# Patient Record
Sex: Male | Born: 1960 | Race: White | Hispanic: No | Marital: Married | Smoking: Never smoker
Health system: Southern US, Community
[De-identification: ages and names within clinical notes are randomized; demographics above are authoritative.]

## PROBLEM LIST (undated history)

## (undated) DIAGNOSIS — R7309 Other abnormal glucose: Secondary | ICD-10-CM

## (undated) DIAGNOSIS — K760 Fatty (change of) liver, not elsewhere classified: Secondary | ICD-10-CM

## (undated) DIAGNOSIS — E119 Type 2 diabetes mellitus without complications: Secondary | ICD-10-CM

## (undated) DIAGNOSIS — R079 Chest pain, unspecified: Secondary | ICD-10-CM

## (undated) DIAGNOSIS — K219 Gastro-esophageal reflux disease without esophagitis: Secondary | ICD-10-CM

## (undated) DIAGNOSIS — M545 Low back pain, unspecified: Secondary | ICD-10-CM

## (undated) DIAGNOSIS — R9431 Abnormal electrocardiogram [ECG] [EKG]: Secondary | ICD-10-CM

## (undated) DIAGNOSIS — R945 Abnormal results of liver function studies: Secondary | ICD-10-CM

## (undated) DIAGNOSIS — G4730 Sleep apnea, unspecified: Secondary | ICD-10-CM

## (undated) HISTORY — DX: Gastro-esophageal reflux disease without esophagitis: K21.9

## (undated) HISTORY — DX: Low back pain, unspecified: M54.50

## (undated) HISTORY — DX: Other abnormal glucose: R73.09

## (undated) HISTORY — DX: Abnormal results of liver function studies: R94.5

## (undated) HISTORY — DX: Type 2 diabetes mellitus without complications: E11.9

## (undated) HISTORY — DX: Low back pain: M54.5

## (undated) HISTORY — DX: Fatty (change of) liver, not elsewhere classified: K76.0

## (undated) HISTORY — PX: VASECTOMY REVERSAL: SHX243

## (undated) HISTORY — DX: Abnormal electrocardiogram (ECG) (EKG): R94.31

## (undated) HISTORY — DX: Chest pain, unspecified: R07.9

---

## 1984-09-14 HISTORY — PX: VASECTOMY: SHX75

## 2000-04-26 ENCOUNTER — Ambulatory Visit (HOSPITAL_COMMUNITY): Admission: RE | Admit: 2000-04-26 | Discharge: 2000-04-26 | Payer: Self-pay | Admitting: Internal Medicine

## 2000-04-26 ENCOUNTER — Encounter: Payer: Self-pay | Admitting: Internal Medicine

## 2000-06-04 ENCOUNTER — Ambulatory Visit (HOSPITAL_BASED_OUTPATIENT_CLINIC_OR_DEPARTMENT_OTHER): Admission: RE | Admit: 2000-06-04 | Discharge: 2000-06-04 | Payer: Self-pay | Admitting: Critical Care Medicine

## 2000-10-15 ENCOUNTER — Encounter (INDEPENDENT_AMBULATORY_CARE_PROVIDER_SITE_OTHER): Payer: Self-pay

## 2000-10-15 ENCOUNTER — Ambulatory Visit (HOSPITAL_COMMUNITY): Admission: RE | Admit: 2000-10-15 | Discharge: 2000-10-16 | Payer: Self-pay | Admitting: Otolaryngology

## 2001-10-21 ENCOUNTER — Encounter: Payer: Self-pay | Admitting: Occupational Medicine

## 2001-10-21 ENCOUNTER — Encounter: Admission: RE | Admit: 2001-10-21 | Discharge: 2001-10-21 | Payer: Self-pay | Admitting: Occupational Medicine

## 2004-10-01 ENCOUNTER — Ambulatory Visit: Payer: Self-pay | Admitting: Internal Medicine

## 2005-11-12 ENCOUNTER — Ambulatory Visit: Payer: Self-pay | Admitting: Internal Medicine

## 2005-11-13 ENCOUNTER — Ambulatory Visit: Payer: Self-pay | Admitting: Internal Medicine

## 2006-05-10 ENCOUNTER — Ambulatory Visit: Payer: Self-pay | Admitting: Internal Medicine

## 2006-05-19 ENCOUNTER — Ambulatory Visit: Payer: Self-pay | Admitting: Internal Medicine

## 2006-07-26 ENCOUNTER — Ambulatory Visit: Payer: Self-pay | Admitting: Internal Medicine

## 2008-02-16 ENCOUNTER — Encounter: Payer: Self-pay | Admitting: Internal Medicine

## 2008-02-16 DIAGNOSIS — R945 Abnormal results of liver function studies: Secondary | ICD-10-CM | POA: Insufficient documentation

## 2008-02-16 DIAGNOSIS — M48061 Spinal stenosis, lumbar region without neurogenic claudication: Secondary | ICD-10-CM | POA: Insufficient documentation

## 2009-10-09 ENCOUNTER — Ambulatory Visit: Payer: Self-pay | Admitting: Internal Medicine

## 2009-10-09 DIAGNOSIS — R079 Chest pain, unspecified: Secondary | ICD-10-CM | POA: Insufficient documentation

## 2009-10-09 DIAGNOSIS — R9431 Abnormal electrocardiogram [ECG] [EKG]: Secondary | ICD-10-CM | POA: Insufficient documentation

## 2009-10-10 ENCOUNTER — Encounter (INDEPENDENT_AMBULATORY_CARE_PROVIDER_SITE_OTHER): Payer: Self-pay | Admitting: *Deleted

## 2009-10-14 ENCOUNTER — Telehealth (INDEPENDENT_AMBULATORY_CARE_PROVIDER_SITE_OTHER): Payer: Self-pay | Admitting: *Deleted

## 2009-10-15 ENCOUNTER — Encounter (HOSPITAL_COMMUNITY): Admission: RE | Admit: 2009-10-15 | Discharge: 2009-12-19 | Payer: Self-pay | Admitting: Internal Medicine

## 2009-10-15 ENCOUNTER — Ambulatory Visit: Payer: Self-pay

## 2009-10-15 ENCOUNTER — Ambulatory Visit: Payer: Self-pay | Admitting: Internal Medicine

## 2009-10-16 ENCOUNTER — Encounter: Payer: Self-pay | Admitting: Internal Medicine

## 2009-10-21 ENCOUNTER — Telehealth: Payer: Self-pay | Admitting: Internal Medicine

## 2009-10-25 ENCOUNTER — Ambulatory Visit: Payer: Self-pay | Admitting: Internal Medicine

## 2009-10-25 DIAGNOSIS — K219 Gastro-esophageal reflux disease without esophagitis: Secondary | ICD-10-CM | POA: Insufficient documentation

## 2009-10-25 DIAGNOSIS — R7309 Other abnormal glucose: Secondary | ICD-10-CM | POA: Insufficient documentation

## 2009-10-25 LAB — CONVERTED CEMR LAB
ALT: 32 units/L (ref 0–53)
AST: 23 units/L (ref 0–37)
Albumin: 4.1 g/dL (ref 3.5–5.2)
Alkaline Phosphatase: 62 units/L (ref 39–117)
BUN: 10 mg/dL (ref 6–23)
Basophils Absolute: 0 10*3/uL (ref 0.0–0.1)
Basophils Relative: 0.3 % (ref 0.0–3.0)
Bilirubin Urine: NEGATIVE
Bilirubin, Direct: 0.2 mg/dL (ref 0.0–0.3)
CO2: 28 meq/L (ref 19–32)
Calcium: 9.2 mg/dL (ref 8.4–10.5)
Chloride: 108 meq/L (ref 96–112)
Cholesterol: 153 mg/dL (ref 0–200)
Creatinine, Ser: 0.9 mg/dL (ref 0.4–1.5)
Eosinophils Absolute: 0.1 10*3/uL (ref 0.0–0.7)
Eosinophils Relative: 1.5 % (ref 0.0–5.0)
GFR calc non Af Amer: 96.06 mL/min (ref >60)
Glucose, Bld: 91 mg/dL (ref 70–99)
HCT: 41.6 % (ref 39.0–52.0)
HDL: 37.8 mg/dL — ABNORMAL LOW (ref >39.00)
Hemoglobin, Urine: NEGATIVE
Hemoglobin: 13.9 g/dL (ref 13.0–17.0)
Hgb A1c MFr Bld: 5.5 % (ref 4.6–6.5)
Ketones, ur: NEGATIVE mg/dL
LDL Cholesterol: 99 mg/dL (ref 0–99)
Leukocytes, UA: NEGATIVE
Lymphocytes Relative: 31.4 % (ref 12.0–46.0)
Lymphs Abs: 1.3 10*3/uL (ref 0.7–4.0)
MCHC: 33.5 g/dL (ref 30.0–36.0)
MCV: 92 fL (ref 78.0–100.0)
Monocytes Absolute: 0.3 10*3/uL (ref 0.1–1.0)
Monocytes Relative: 6.9 % (ref 3.0–12.0)
Neutro Abs: 2.3 10*3/uL (ref 1.4–7.7)
Neutrophils Relative %: 59.9 % (ref 43.0–77.0)
Nitrite: NEGATIVE
Platelets: 163 10*3/uL (ref 150.0–400.0)
Potassium: 4.3 meq/L (ref 3.5–5.1)
RBC: 4.52 M/uL (ref 4.22–5.81)
RDW: 12.8 % (ref 11.5–14.6)
Sodium: 140 meq/L (ref 135–145)
Specific Gravity, Urine: >=1.03 (ref 1.000–1.030)
TSH: 2.15 microintl units/mL (ref 0.35–5.50)
Total Bilirubin: 0.4 mg/dL (ref 0.3–1.2)
Total CHOL/HDL Ratio: 4
Total Protein, Urine: NEGATIVE mg/dL
Total Protein: 7.3 g/dL (ref 6.0–8.3)
Triglycerides: 81 mg/dL (ref 0.0–149.0)
Urine Glucose: NEGATIVE mg/dL
Urobilinogen, UA: 0.2 (ref 0.0–1.0)
VLDL: 16.2 mg/dL (ref 0.0–40.0)
WBC: 4 10*3/uL — ABNORMAL LOW (ref 4.5–10.5)
pH: 5 (ref 5.0–8.0)

## 2010-10-16 NOTE — Assessment & Plan Note (Signed)
Summary: Cardiology Nuclear Study  Nuclear Med Background Indications for Stress Test: Evaluation for Ischemia, Abnormal EKG    History Comments: NO DOCUMENTED CAD  Symptoms: Chest Pain  Symptoms Comments: CP with stooping and coughing. Last episode of CP:7:00 am today, 5-6/10.   Nuclear Pre-Procedure Cardiac Risk Factors: Obesity Caffeine/Decaff Intake: none NPO After: 12:00 AM Lungs: Clear.  O2 Sat 96% on RA. IV 0.9% NS with Angio Cath: 20g     IV Site: (R) Wrist IV Started by: Stanton Kidney EMT-P Chest Size (in) 46     Height (in): 71 Weight (lb): 234 BMI: 32.75  Nuclear Med Study 1 or 2 day study:  1 day     Stress Test Type:  Eugenie Birks Reading MD:  Arvilla Meres, MD     Referring MD:  Sanda Linger, MD Resting Radionuclide:  Technetium 35m Tetrofosmin     Resting Radionuclide Dose:  11.0 mCi  Stress Radionuclide:  Technetium 63m Tetrofosmin     Stress Radionuclide Dose:  33.0 mCi   Stress Protocol   Lexiscan: 0.4 mg   Stress Test Technologist:  Rea College CMA-N     Nuclear Technologist:  Harlow Asa CNMT  Rest Procedure  Myocardial perfusion imaging was performed at rest 45 minutes following the intravenous administration of Myoview Technetium 28m Tetrofosmin.  Stress Procedure  The patient was initially scheduled to walk the treadmill utilizing the Bruce protocol, but due to baseline EKG changes and recent chest pain this a.m., Dr. Graciela Husbands recommended doing lexiscan instead.  The patient received IV Lexiscan 0.4 mg over 15-seconds.  Myoview injected at 30-seconds.  There were no significant changes with infusion.  Quantitative spect images were obtained after a 45 minute delay.  QPS Raw Data Images:  Normal; no motion artifact; normal heart/lung ratio. Stress Images:  There is normal uptake in all areas. Rest Images:  Normal homogeneous uptake in all areas of the myocardium. Subtraction (SDS):  Normal Transient Ischemic Dilatation:  .99  (Normal <1.22)  Lung/Heart Ratio:  .29  (Normal <0.45)  Quantitative Gated Spect Images QGS EDV:  117 ml QGS ESV:  49 ml QGS EF:  58 % QGS cine images:  Normal.   Findings Normal nuclear study      Overall Impression  Exercise Capacity: Lexiscan study with no exercise. ECG Impression: Baseline: NSR; No significant ST segment change with Lexiscan. Overall Impression: Normal stress nuclear study.

## 2010-10-16 NOTE — Progress Notes (Signed)
Summary: CALL  Phone Note Call from Patient Call back at Home Phone [phone]   Summary of Call: Pt's wife called, she would like a call regarding stress test results and cxr.  Initial call taken by: Lamar Sprinkles, CMA,  October 21, 2009 4:43 PM  Follow-up for Phone Call        Pt's wife informed that pt is due for f/u office visit and letter was mailed Follow-up by: Lamar Sprinkles, CMA,  October 21, 2009 6:22 PM

## 2010-10-16 NOTE — Letter (Signed)
Summary: Lipid Letter  [facility]  [address] [place], [place] [address]   Phone: [phone]  Fax: [phone]    10/25/2009  Henry Lang [address] [place], [place]  [address]  Dear Henry Lang:  We have carefully reviewed your last lipid profile from 10/25/2009 and the results are noted below with a summary of recommendations for lipid management.    Cholesterol:       153     Goal: <200   HDL "good" Cholesterol:   57.84     Goal: >40   LDL "bad" Cholesterol:   99     Goal: <130   Triglycerides:       81.0     Goal: <150    pretty good results    TLC Diet (Therapeutic Lifestyle Change): Saturated Fats & Transfatty acids should be kept < 7% of total calories ***Reduce Saturated Fats Polyunstaurated Fat can be up to 10% of total calories Monounsaturated Fat Fat can be up to 20% of total calories Total Fat should be no greater than 25-35% of total calories Carbohydrates should be 50-60% of total calories Protein should be approximately 15% of total calories Fiber should be at least 20-30 grams a day ***Increased fiber may help lower LDL Total Cholesterol should be < 200mg /day Consider adding plant stanol/sterols to diet (example: Benacol spread) ***A higher intake of unsaturated fat may reduce Triglycerides and Increase HDL    Adjunctive Measures (may lower LIPIDS and reduce risk of Heart Attack) include: Aerobic Exercise (20-30 minutes 3-4 times a week) Limit Alcohol Consumption Weight Reduction Aspirin 75-81 mg a day by mouth (if not allergic or contraindicated) Dietary Fiber 20-30 grams a day by mouth     Current Medications: 1)    Aciphex 20 Mg Tbec (Rabeprazole sodium) .... Once daily for indigestion  If you have any questions, please call. We appreciate being able to work with you.   Sincerely,    [facility] Etta Grandchild MD

## 2010-10-16 NOTE — Assessment & Plan Note (Signed)
Summary: FU--STC   Vital Signs:  Patient profile:   50 year old male Height:      71 inches Weight:      228 pounds BMI:     31.91 O2 Sat:      96 % on Room air Temp:     97.7 degrees F oral Pulse rate:   58 / minute Pulse rhythm:   regular Resp:     16 per minute BP sitting:   104 / 70  (left arm) Cuff size:   large  Vitals Entered By: Rock Nephew CMA (October 25, 2009 8:23 AM)  Nutrition Counseling: Patient's BMI is greater than 25 and therefore counseled on weight management options.  O2 Flow:  Room air CC: follow-up visit, Abdominal Pain Is Patient Diabetic? No Pain Assessment Patient in pain? no        Primary Care Provider:  Etta Grandchild MD  CC:  follow-up visit and Abdominal Pain.  History of Present Illness: He returns for f/up and says the pain around his epigastric region is better and less frequent. His cardiac test was normal.  Dyspepsia History:      He has no alarm features of dyspepsia including no history of melena, hematochezia, dysphagia, persistent vomiting, or involuntary weight loss > 5%.  There is a prior history of GERD.  The patient does not have a prior history of documented ulcer disease.  The dominant symptom is heartburn or acid reflux.  An H-2 blocker medication is not currently being taken.     Current Medications (verified): 1)  None  Allergies (verified): No Known Drug Allergies  Past History:  Past Medical History: Reviewed history from 02/16/2008 and no changes required. Low back pain  Past Surgical History: Reviewed history from 02/16/2008 and no changes required. Vasectomy ( 1986) Vasectomy reversal (1992)  Family History: Reviewed history from 10/09/2009 and no changes required. Family History Breast cancer 1st degree relative <50 Family History Diabetes 1st degree relative Family History Hypertension  Social History: Reviewed history from 10/09/2009 and no changes required. Occupation: Psychologist, prison and probation services Married Never Smoked Alcohol use-no Drug use-no Regular exercise-yes  Review of Systems       The patient complains of weight gain and abdominal pain.  The patient denies anorexia, fever, chest pain, syncope, dyspnea on exertion, peripheral edema, prolonged cough, headaches, hemoptysis, melena, hematochezia, severe indigestion/heartburn, hematuria, genital sores, suspicious skin lesions, depression, enlarged lymph nodes, angioedema, and testicular masses.    Physical Exam  General:  alert, well-developed, well-nourished, well-hydrated, appropriate dress, normal appearance, healthy-appearing, cooperative to examination, and overweight-appearing.   Head:  normocephalic, atraumatic, no abnormalities observed, and no abnormalities palpated.   Mouth:  Oral mucosa and oropharynx without lesions or exudates.  Teeth in good repair. Neck:  supple, full ROM, no masses, no thyromegaly, no JVD, normal carotid upstroke, no carotid bruits, and no cervical lymphadenopathy.   Chest Wall:  No deformities, masses, tenderness or gynecomastia noted. Breasts:  No masses or gynecomastia noted Lungs:  Normal respiratory effort, chest expands symmetrically. Lungs are clear to auscultation, no crackles or wheezes. Heart:  Normal rate and regular rhythm. S1 and S2 normal without gallop, murmur, click, rub or other extra sounds. Abdomen:  soft, non-tender, normal bowel sounds, no distention, no masses, no guarding, no rigidity, no rebound tenderness, no hepatomegaly, and no splenomegaly.   Rectal:  No external abnormalities noted. Normal sphincter tone. No rectal masses or tenderness. heme negatvie stool. Genitalia:  circumcised, no hydrocele, no varicocele,  no scrotal masses, no testicular masses or atrophy, no cutaneous lesions, and no urethral discharge.   Prostate:  Prostate gland firm and smooth, no enlargement, nodularity, tenderness, mass, asymmetry or induration. Msk:  No deformity or scoliosis noted  of thoracic or lumbar spine.   Pulses:  R and L carotid,radial,femoral,dorsalis pedis and posterior tibial pulses are full and equal bilaterally Extremities:  No clubbing, cyanosis, edema, or deformity noted with normal full range of motion of all joints.   Neurologic:  No cranial nerve deficits noted. Station and gait are normal. Plantar reflexes are down-going bilaterally. DTRs are symmetrical throughout. Sensory, motor and coordinative functions appear intact. Skin:  Intact without suspicious lesions or rashes Cervical Nodes:  no anterior cervical adenopathy and no posterior cervical adenopathy.   Axillary Nodes:  no R axillary adenopathy and no L axillary adenopathy.   Inguinal Nodes:  no R inguinal adenopathy and no L inguinal adenopathy.   Psych:  Cognition and judgment appear intact. Alert and cooperative with normal attention span and concentration. No apparent delusions, illusions, hallucinations   Impression & Recommendations:  Problem # 1:  GERD (ICD-530.81)  His updated medication list for this problem includes:    Aciphex 20 Mg Tbec (Rabeprazole sodium) ..... Once daily for indigestion  Problem # 2:  HYPERGLYCEMIA, BORDERLINE (ICD-790.29) Assessment: New  Orders: Venipuncture ([address]) TLB-Lipid Panel (80061-LIPID) TLB-A1C / Hgb A1C (Glycohemoglobin) (83036-A1C) TLB-Udip w/ Micro (81001-URINE) TLB-BMP (Basic Metabolic Panel-BMET) (80048-METABOL) TLB-CBC Platelet - w/Differential (85025-CBCD) TLB-Hepatic/Liver Function Pnl (80076-HEPATIC) TLB-TSH (Thyroid Stimulating Hormone) (84443-TSH)  Problem # 3:  ROUTINE GENERAL MEDICAL EXAM@HEALTH  CARE FACL (ICD-V70.0) Assessment: New  Orders: Venipuncture ([address]) TLB-Lipid Panel (80061-LIPID) TLB-A1C / Hgb A1C (Glycohemoglobin) (83036-A1C) TLB-Udip w/ Micro (81001-URINE) TLB-BMP (Basic Metabolic Panel-BMET) (80048-METABOL) TLB-CBC Platelet - w/Differential (85025-CBCD) TLB-Hepatic/Liver Function Pnl (80076-HEPATIC) TLB-TSH  (Thyroid Stimulating Hormone) (84443-TSH)  Discussed using sunscreen, use of alcohol, drug use, self testicular exam, routine dental care, routine eye care, routine physical exam, seat belts, multiple vitamins,  and recommendations for immunizations.  Discussed exercise and checking cholesterol.  Discussed gun safety and recommend checking PSA.  Complete Medication List: 1)  Aciphex 20 Mg Tbec (Rabeprazole sodium) .... Once daily for indigestion  Patient Instructions: 1)  Please schedule a follow-up appointment in 2 months. 2)  Avoid foods high in acid (tomatoes, citrus juices, spicy foods). Avoid eating within two hours of lying down or before exercising. Do not over eat; try smaller more frequent meals. Elevate head of bed twelve inches when sleeping. 3)  It is important that you exercise regularly at least 20 minutes 5 times a week. If you develop chest pain, have severe difficulty breathing, or feel very tired , stop exercising immediately and seek medical attention. 4)  You need to lose weight. Consider a lower calorie diet and regular exercise.  Prescriptions: ACIPHEX 20 MG TBEC (RABEPRAZOLE SODIUM) once daily for indigestion  #24 x 0   Entered and Authorized by:   Etta Grandchild MD   Signed by:   Etta Grandchild MD on 10/25/2009   Method used:   Samples Given   RxID:   [id number]

## 2010-10-16 NOTE — Letter (Signed)
Summary: [facility] Consult Scheduled Letter  [facility]  [address] [place], [place] [address]   Phone: [phone]  Fax: [phone]      10/10/2009 MRN: [id number]  [facility] [address], [place]  [address]    Dear Mr. Borawski,      We have scheduled an appointment for you.  At the recommendation of Dr.Jones, we have scheduled you a consult with [facility]) on Febuary 1,2011 at 9:00.Their phone number is [phone].If this appointment day and time is not convenient for you, please feel free to call the office of the doctor you are being referred to at the number listed above and reschedule the appointment.  [facility] [address]  [place] [address]   Thank you,  Patient Care Coordinator [facility]

## 2010-10-16 NOTE — Progress Notes (Signed)
Summary: Nuclear Pre-Procedure  Phone Note Outgoing Call   Call placed by: Milana Na, EMT-P,  October 14, 2009 2:08 PM Summary of Call: Left message with information on Myoview Information Sheet (see scanned document for details).      Nuclear Med Background Indications for Stress Test: Evaluation for Ischemia, Abnormal EKG     Symptoms: Chest Pain    Nuclear Pre-Procedure Height (in): 72  Nuclear Med Study Referring MD:  Sanda Linger

## 2010-10-16 NOTE — Letter (Signed)
Summary: Results Follow-up Letter  [facility]  [address] [place], [place] [address]   Phone: [phone]  Fax: [phone]    10/16/2009  [address] [place], [place]  [address]  Dear Mr. Mcwilliams,   The following are the results of your recent test(s):  Test     Result     Cradiac test   normal  _________________________________________________________  Please call for an appointment as directed _________________________________________________________ _________________________________________________________ _________________________________________________________  Sincerely,  Sanda Linger MD [facility]

## 2010-10-16 NOTE — Assessment & Plan Note (Signed)
Summary: NEW/ [facility]/ FORMER PT OF AVP/ LOV 2007 / PAIN IN STOMACH/SE...   Vital Signs:  Patient profile:   50 year old Lang Height:      72 inches Weight:      227 pounds BMI:     30.90 O2 Sat:      95 % on Room air Temp:     98.0 degrees F oral Pulse rate:   65 / minute Pulse rhythm:   regular Resp:     16 per minute BP sitting:   98 / 68  (left arm) Cuff size:   large  Vitals Entered By: Rock Nephew CMA (October 09, 2009 9:07 AM)  Nutrition Counseling: Patient's BMI is greater than 25 and therefore counseled on weight management options.  O2 Flow:  Room air CC: new to establish, Cough   Primary Care Provider:  Etta Grandchild MD  CC:  new to establish and Cough.  History of Present Illness:       This is a 50 year old Lang who presents with Chest pain.  The symptoms began 2 weeks ago.  On a scale of 1 to 10, the intensity is described as a 4.  The patient denies exertional chest pain, nausea, vomiting, diaphoresis, shortness of breath, palpitations, dizziness, light headedness, syncope, and indigestion.  The pain is described as intermittent and sharp.  The pain is located in the substernal area and the pain does not radiate.  Episodes of chest pain last < 1 minute.  The pain is brought on or made worse by any activity and coughing.    Cough      The patient also presents with Cough.  The symptoms began 2 weeks ago.  The intensity is described as mild.  The patient reports non-productive cough, but denies productive cough, pleuritic chest pain, shortness of breath, wheezing, exertional dyspnea, fever, hemoptysis, and malaise.  The patient denies the following symptoms: cold/URI symptoms, sore throat, nasal congestion, chronic rhinitis, weight loss, acid reflux symptoms, and peripheral edema.  The cough is worse with cold exposure.    Preventive Screening-Counseling & Management  Alcohol-Tobacco     Alcohol drinks/day: <1     Alcohol type: beer     >5/day in last 3 mos:  no     Alcohol Counseling: not indicated; use of alcohol is not excessive or problematic     Feels need to cut down: no     Feels annoyed by complaints: no     Feels guilty re: drinking: no     Needs 'eye opener' in am: no     Smoking Status: never  Caffeine-Diet-Exercise     Does Patient Exercise: yes  Hep-HIV-STD-Contraception     Hepatitis Risk: no risk noted     HIV Risk: no risk noted     STD Risk: no risk noted      Sexual History:  currently monogamous.        Drug Use:  never and no.        Blood Transfusions:  no.    Current Medications (verified): 1)  None  Allergies (verified): No Known Drug Allergies  Past History:  Past Medical History: Reviewed history from 02/16/2008 and no changes required. Low back pain  Past Surgical History: Reviewed history from 02/16/2008 and no changes required. Vasectomy ( 1986) Vasectomy reversal (1992)  Family History: Reviewed history and no changes required. Family History Breast cancer 1st degree relative <50 Family History Diabetes 1st degree relative Family  History Hypertension  Social History: Reviewed history and no changes required. Occupation: Journalist, newspaper Married Never Smoked Alcohol use-no Drug use-no Regular exercise-yes Drug Use:  never, no Does Patient Exercise:  yes Hepatitis Risk:  no risk noted HIV Risk:  no risk noted STD Risk:  no risk noted Sexual History:  currently monogamous Blood Transfusions:  no  Review of Systems       The patient complains of weight gain, chest pain, and prolonged cough.  The patient denies anorexia, fever, weight loss, syncope, dyspnea on exertion, peripheral edema, headaches, hemoptysis, abdominal pain, melena, hematochezia, severe indigestion/heartburn, hematuria, suspicious skin lesions, difficulty walking, depression, enlarged lymph nodes, angioedema, and testicular masses.    Physical Exam  General:  alert, well-developed, well-nourished, well-hydrated,  appropriate dress, normal appearance, healthy-appearing, cooperative to examination, and overweight-appearing.   Head:  normocephalic, atraumatic, no abnormalities observed, and no abnormalities palpated.   Eyes:  No corneal or conjunctival inflammation noted. EOMI. Perrla. Funduscopic exam benign, without hemorrhages, exudates or papilledema. Vision grossly normal. Mouth:  Oral mucosa and oropharynx without lesions or exudates.  Teeth in good repair. Neck:  supple, full ROM, no masses, no thyromegaly, no JVD, normal carotid upstroke, no carotid bruits, and no cervical lymphadenopathy.   Chest Wall:  No deformities, masses, tenderness or gynecomastia noted. Breasts:  No masses or gynecomastia noted Lungs:  Normal respiratory effort, chest expands symmetrically. Lungs are clear to auscultation, no crackles or wheezes. Heart:  Normal rate and regular rhythm. S1 and S2 normal without gallop, murmur, click, rub or other extra sounds. Abdomen:  soft, non-tender, normal bowel sounds, no distention, no masses, no guarding, no rigidity, no hepatomegaly, and no splenomegaly.   Msk:  No deformity or scoliosis noted of thoracic or lumbar spine.   Pulses:  R and L carotid,radial,femoral,dorsalis pedis and posterior tibial pulses are full and equal bilaterally Extremities:  No clubbing, cyanosis, edema, or deformity noted with normal full range of motion of all joints.   Neurologic:  No cranial nerve deficits noted. Station and gait are normal. Plantar reflexes are down-going bilaterally. DTRs are symmetrical throughout. Sensory, motor and coordinative functions appear intact. Skin:  Intact without suspicious lesions or rashes Cervical Nodes:  no anterior cervical adenopathy and no posterior cervical adenopathy.   Axillary Nodes:  no R axillary adenopathy and no L axillary adenopathy.   Psych:  Cognition and judgment appear intact. Alert and cooperative with normal attention span and concentration. No apparent  delusions, illusions, hallucinations Additional Exam:  EKG shows NSR with poor RWP in V1 and V2 but no acute ST/T waves or Q waves. There is minimal evidence of LVH.   Impression & Recommendations:  Problem # 1:  ABNORMAL ELECTROCARDIOGRAM (ICD-794.31) Assessment New  Orders: [facility] (Card[facility]) EKG w/ Interpretation (93[address])  Problem # 2:  CHEST PAIN (ICD-786.50) Assessment: New this sounds musculoskeletal and/or part of a viral URI with cough. Since his EKG is abnormal will pursue further with Xray and [facility] Orders: T-2 View CXR (71020TC) [facility] ([facility]) EKG w/ Interpretation ([address])  Patient Instructions: 1)  Please schedule a follow-up appointment in 2 weeks. 2)  Get plenty of rest, drink lots of clear liquids, and use Tylenol or Ibuprofen for fever and comfort. Return in 7-10 days if you're not better:sooner if you're feeling worse.

## 2011-01-30 NOTE — Op Note (Signed)
[facility]  Patient:    Henry Lang, Henry Lang                          MRN: [id number] Proc. Date: 10/15/00 Attending:  Margit Banda. Jearld Fenton, M.D. CC:         Sonda Primes, M.D. [facility]  Luisa Hart E. Delford Field, M.D. [facility]   Operative Report  PREOPERATIVE DIAGNOSIS:  Obstructive sleep apnea.  POSTOPERATIVE DIAGNOSIS:  Obstructive sleep apnea.  PROCEDURES:  Septoplasty, submucous resection of inferior turbinates, uvulopharyngopalatoplasty, and tonsillectomy.  ANESTHESIA:  General endotracheal tube.  ESTIMATED BLOOD LOSS:  Approximately 25 cc.  INDICATIONS:  This is a 50 year old who has had obstructive sleep apnea diagnosed with an RDI of approximately 37 and slight oxygen desaturation.  He was tried on CPAP and did not do well on this and was very unhappy and not getting good treatment.  He then was offered an option of surgery, which he elected to proceed with.  He understands the risks to be bleeding, infection, perforation, change in the external appearance of his nose, chronic crusting and drying, velopharyngeal insufficiency, change in his voice, and risk of the anesthetic.  All questions were answered, and consent was obtained.  DESCRIPTION OF PROCEDURE:  Patient taken to the operating room and placed in the supine position after adequate general endotracheal tube anesthesia, was placed in the supine position, prepped and draped in the usual sterile manner. Oxymetazoline pledgets were placed into the nose bilaterally, and the septum and inferior turbinates were injected with 1% lidocaine with 1:100,000 epinephrine.  The right hemitransfixion incision was performed, raising a mucoperichondrial and osteal flap.  The cartilage was divided 2 cm posterior to the caudal strut.  He had a significant deviation of both the posterior aspect of the cartilaginous septum as well as the bony septum.  The cartilage was removed with a Therapist, nutritional.  The Jansen-Middleton  forceps, open, were used to remove the bony portion of the septum.  The spur, which was inferior, was removed with the [facility] forceps.  This corrected the septal deflection. The turbinates were then infractured and a midline incision made with a 15 blade, the mucosal flap elevated superiorly, and the inferior mucosa and bone were removed with the turbinate scissors.  The edge was cauterized with suction cautery.  The turbinates were outfractured with a Therapist, nutritional.  The hemitransfixion incision was closed with interrupted 4-0 chromic and a quilting 4-0 plain gut placed at the septum.  Telfa rolls soaked in bacitracin were placed into the nose bilaterally and secured with 3-0 nylon.  The patient was then placed in the [facility] position.  The Crowe-Davis mouth gag was inserted, retracted, and suspended from the [facility] stand.  The palate had been marked with an [place] ink, and the dissection was begun with electrocautery just above the level of the uvula, approximately 0.5 cm.  This was dissected down into the anterior fossa on the left side, and the tonsil was identified and the capsule of it was identified.  This was removed with electrocautery dissection.  The dissection was carried across the palate into the right anterior tonsillar pillar, and again the tonsillar fossa was identified and removed with electrocautery dissection.  Hemostasis was achieved with suction cautery.  The tonsils and palate were all removed in one en bloc specimen.  The palate was then closed with interrupted 3-0 Vicryl.  There was very nice approximation of his palate and a much  better open airway.  The Crowe-Davis was released and resuspended, and there was hemostasis present in all locations.  The area was irrigated.  The hypopharynx, esophagus, and stomach were suctioned with the NG tube.  The patient was then awakened, brought to recovery in stable condition, counts correct. DD:  10/15/00 TD:  10/16/00 Job:  [id number]

## 2011-09-15 HISTORY — PX: PALATE / UVULA BIOPSY / EXCISION: SUR128

## 2012-10-21 ENCOUNTER — Ambulatory Visit (INDEPENDENT_AMBULATORY_CARE_PROVIDER_SITE_OTHER)
Admission: RE | Admit: 2012-10-21 | Discharge: 2012-10-21 | Disposition: A | Discharge status: Home / Self Care | Payer: PRIVATE HEALTH INSURANCE | Source: Ambulatory Visit | Attending: Internal Medicine | Admitting: Internal Medicine

## 2012-10-21 ENCOUNTER — Other Ambulatory Visit (INDEPENDENT_AMBULATORY_CARE_PROVIDER_SITE_OTHER): Payer: PRIVATE HEALTH INSURANCE

## 2012-10-21 ENCOUNTER — Ambulatory Visit (INDEPENDENT_AMBULATORY_CARE_PROVIDER_SITE_OTHER): Payer: PRIVATE HEALTH INSURANCE | Admitting: Internal Medicine

## 2012-10-21 ENCOUNTER — Encounter: Payer: Self-pay | Admitting: Internal Medicine

## 2012-10-21 VITALS — BP 118/70 | HR 80 | Temp 97.8°F | Resp 16 | Ht 71.0 in | Wt 260.0 lb

## 2012-10-21 DIAGNOSIS — R10814 Left lower quadrant abdominal tenderness: Secondary | ICD-10-CM

## 2012-10-21 DIAGNOSIS — Z0000 Encounter for general adult medical examination without abnormal findings: Secondary | ICD-10-CM

## 2012-10-21 LAB — LIPASE: Lipase: 24 U/L (ref 11.0–59.0)

## 2012-10-21 LAB — URINALYSIS, ROUTINE W REFLEX MICROSCOPIC
Ketones, ur: NEGATIVE
Specific Gravity, Urine: <=1.005 (ref 1.000–1.030)
Urine Glucose: NEGATIVE
Urobilinogen, UA: 0.2 (ref 0.0–1.0)

## 2012-10-21 LAB — COMPREHENSIVE METABOLIC PANEL
Alkaline Phosphatase: 62 U/L (ref 39–117)
BUN: 14 mg/dL (ref 6–23)
Creatinine, Ser: 0.9 mg/dL (ref 0.4–1.5)
Glucose, Bld: 102 mg/dL — ABNORMAL HIGH (ref 70–99)
Sodium: 138 mEq/L (ref 135–145)
Total Bilirubin: 0.6 mg/dL (ref 0.3–1.2)

## 2012-10-21 LAB — CBC WITH DIFFERENTIAL/PLATELET
Basophils Relative: 0.4 % (ref 0.0–3.0)
Eosinophils Relative: 1.1 % (ref 0.0–5.0)
HCT: 40.3 % (ref 39.0–52.0)
Lymphs Abs: 1.7 10*3/uL (ref 0.7–4.0)
MCV: 88.4 fl (ref 78.0–100.0)
Monocytes Absolute: 0.5 10*3/uL (ref 0.1–1.0)
Neutro Abs: 4.3 10*3/uL (ref 1.4–7.7)
Platelets: 198 10*3/uL (ref 150.0–400.0)
WBC: 6.6 10*3/uL (ref 4.5–10.5)

## 2012-10-21 LAB — LIPID PANEL
HDL: 31.4 mg/dL — ABNORMAL LOW (ref >39.00)
VLDL: 21 mg/dL (ref 0.0–40.0)

## 2012-10-21 LAB — TSH: TSH: 2.85 u[IU]/mL (ref 0.35–5.50)

## 2012-10-21 NOTE — Patient Instructions (Signed)
Health Maintenance, Males A healthy lifestyle and preventative care can promote health and wellness.  Maintain regular health, dental, and eye exams.  Eat a healthy diet. Foods like vegetables, fruits, whole grains, low-fat dairy products, and lean protein foods contain the nutrients you need without too many calories. Decrease your intake of foods high in solid fats, added sugars, and salt. Get information about a proper diet from your caregiver, if necessary.  Regular physical exercise is one of the most important things you can do for your health. Most adults should get at least 150 minutes of moderate-intensity exercise (any activity that increases your heart rate and causes you to sweat) each week. In addition, most adults need muscle-strengthening exercises on 2 or more days a week.   Maintain a healthy weight. The body mass index (BMI) is a screening tool to identify possible weight problems. It provides an estimate of body fat based on height and weight. Your caregiver can help determine your BMI, and can help you achieve or maintain a healthy weight. For adults 20 years and older:  A BMI below 18.5 is considered underweight.  A BMI of 18.5 to 24.9 is normal.  A BMI of 25 to 29.9 is considered overweight.  A BMI of 30 and above is considered obese.  Maintain normal blood lipids and cholesterol by exercising and minimizing your intake of saturated fat. Eat a balanced diet with plenty of fruits and vegetables. Blood tests for lipids and cholesterol should begin at age 20 and be repeated every 5 years. If your lipid or cholesterol levels are high, you are over 50, or you are a high risk for heart disease, you may need your cholesterol levels checked more frequently.Ongoing high lipid and cholesterol levels should be treated with medicines, if diet and exercise are not effective.  If you smoke, find out from your caregiver how to quit. If you do not use tobacco, do not start.  If you  choose to drink alcohol, do not exceed 2 drinks per day. One drink is considered to be 12 ounces (355 mL) of beer, 5 ounces (148 mL) of wine, or 1.5 ounces (44 mL) of liquor.  Avoid use of street drugs. Do not share needles with anyone. Ask for help if you need support or instructions about stopping the use of drugs.  High blood pressure causes heart disease and increases the risk of stroke. Blood pressure should be checked at least every 1 to 2 years. Ongoing high blood pressure should be treated with medicines if weight loss and exercise are not effective.  If you are 45 to 51 years old, ask your caregiver if you should take aspirin to prevent heart disease.  Diabetes screening involves taking a blood sample to check your fasting blood sugar level. This should be done once every 3 years, after age 45, if you are within normal weight and without risk factors for diabetes. Testing should be considered at a younger age or be carried out more frequently if you are overweight and have at least 1 risk factor for diabetes.  Colorectal cancer can be detected and often prevented. Most routine colorectal cancer screening begins at the age of 50 and continues through age 75. However, your caregiver may recommend screening at an earlier age if you have risk factors for colon cancer. On a yearly basis, your caregiver may provide home test kits to check for hidden blood in the stool. Use of a small camera at the end of a tube,   to directly examine the colon (sigmoidoscopy or colonoscopy), can detect the earliest forms of colorectal cancer. Talk to your caregiver about this at age 73, when routine screening begins. Direct examination of the colon should be repeated every 5 to 10 years through age 23, unless early forms of pre-cancerous polyps or small growths are found.  Hepatitis C blood testing is recommended for all people born from [birth year] through 1964 and any individual with known risks for hepatitis C.  Healthy  men should no longer receive prostate-specific antigen (PSA) blood tests as part of routine cancer screening. Consult with your caregiver about prostate cancer screening.  Testicular cancer screening is not recommended for adolescents or adult males who have no symptoms. Screening includes self-exam, caregiver exam, and other screening tests. Consult with your caregiver about any symptoms you have or any concerns you have about testicular cancer.  Practice safe sex. Use condoms and avoid high-risk sexual practices to reduce the spread of sexually transmitted infections (STIs).  Use sunscreen with a sun protection factor (SPF) of 30 or greater. Apply sunscreen liberally and repeatedly throughout the day. You should seek shade when your shadow is shorter than you. Protect yourself by wearing long sleeves, pants, a wide-brimmed hat, and sunglasses year round, whenever you are outdoors.  Notify your caregiver of new moles or changes in moles, especially if there is a change in shape or color. Also notify your caregiver if a mole is larger than the size of a pencil eraser.  A one-time screening for abdominal aortic aneurysm (AAA) and surgical repair of large AAAs by sound wave imaging (ultrasonography) is recommended for ages 47 to 76 years who are current or former smokers.  Stay current with your immunizations. Document Released: 02/27/2008 Document Revised: 11/23/2011 Document Reviewed: 01/26/2011 [facility] Patient Information 2013 [place], [place]. Abdominal Pain Abdominal pain can be caused by many things. Your caregiver decides the seriousness of your pain by an examination and possibly blood tests and X-rays. Many cases can be observed and treated at home. Most abdominal pain is not caused by a disease and will probably improve without treatment. However, in many cases, more time must pass before a clear cause of the pain can be found. Before that point, it may not be known if you need more testing,  or if hospitalization or surgery is needed. HOME CARE INSTRUCTIONS   Do not take laxatives unless directed by your caregiver.  Take pain medicine only as directed by your caregiver.  Only take over-the-counter or prescription medicines for pain, discomfort, or fever as directed by your caregiver.  Try a clear liquid diet (broth, tea, or water) for as long as directed by your caregiver. Slowly move to a bland diet as tolerated. SEEK IMMEDIATE MEDICAL CARE IF:   The pain does not go away.  You have a fever.  You keep throwing up (vomiting).  The pain is felt only in portions of the abdomen. Pain in the right side could possibly be appendicitis. In an adult, pain in the left lower portion of the abdomen could be colitis or diverticulitis.  You pass bloody or black tarry stools. MAKE SURE YOU:   Understand these instructions.  Will watch your condition.  Will get help right away if you are not doing well or get worse. Document Released: 06/10/2005 Document Revised: 11/23/2011 Document Reviewed: 04/18/2008 [facility] Patient Information 2013 [place], [place].

## 2012-10-21 NOTE — Progress Notes (Signed)
Subjective:    Patient ID: Henry Lang, male    DOB: 03-16-1961, 52 y.o.   MRN: [id number]  Abdominal Pain This is a new problem. The current episode started 1 to 4 weeks ago. The onset quality is gradual. The problem occurs intermittently. The problem has been unchanged. The pain is located in the LLQ. The pain is at a severity of 2/10. The pain is mild. The quality of the pain is sharp. The abdominal pain does not radiate. Associated symptoms include constipation. Pertinent negatives include no anorexia, arthralgias, belching, diarrhea, dysuria, fever, flatus, frequency, headaches, hematochezia, hematuria, melena, myalgias, nausea, vomiting or weight loss. Nothing aggravates the pain. The pain is relieved by nothing. He has tried oral narcotic analgesics (his wife's percocet) for the symptoms. The treatment provided moderate relief.      Review of Systems  Constitutional: Negative for fever, chills, weight loss, diaphoresis, activity change, appetite change, fatigue and unexpected weight change.  HENT: Negative.   Eyes: Negative.   Respiratory: Negative for cough, chest tightness, shortness of breath, wheezing and stridor.   Cardiovascular: Negative for chest pain, palpitations and leg swelling.  Gastrointestinal: Positive for abdominal pain and constipation. Negative for nausea, vomiting, diarrhea, blood in stool, melena, hematochezia, abdominal distention, anal bleeding, rectal pain, anorexia and flatus.  Endocrine: Negative.   Genitourinary: Negative for dysuria, urgency, frequency, hematuria, flank pain, decreased urine volume, discharge, penile swelling, scrotal swelling, enuresis, difficulty urinating, genital sores, penile pain and testicular pain.  Musculoskeletal: Positive for back pain (low back, for 3 weeks). Negative for myalgias, joint swelling, arthralgias and gait problem.  Skin: Negative for color change, pallor, rash and wound.  Neurological: Negative for dizziness,  weakness, light-headedness, numbness and headaches.  Hematological: Negative for adenopathy. Does not bruise/bleed easily.  Psychiatric/Behavioral: Negative.        Objective:   Physical Exam  Vitals reviewed. Constitutional: He is oriented to person, place, and time. He appears well-developed and well-nourished.  Non-toxic appearance. He does not have a sickly appearance. He does not appear ill. No distress.  HENT:  Head: Normocephalic and atraumatic.  Mouth/Throat: Oropharynx is clear and moist. No oropharyngeal exudate.  Eyes: Conjunctivae are normal. Right eye exhibits no discharge. Left eye exhibits no discharge. No scleral icterus.  Neck: Normal range of motion. Neck supple. No JVD present. No tracheal deviation present. No thyromegaly present.  Cardiovascular: Normal rate, normal heart sounds and intact distal pulses.  Exam reveals no gallop and no friction rub.   No murmur heard. Pulmonary/Chest: Effort normal and breath sounds normal. No stridor. No respiratory distress. He has no wheezes. He has no rales. He exhibits no tenderness.  Abdominal: Soft. Normal appearance and bowel sounds are normal. He exhibits no shifting dullness, no distension, no pulsatile liver, no fluid wave, no abdominal bruit, no ascites, no pulsatile midline mass and no mass. There is no hepatosplenomegaly, splenomegaly or hepatomegaly. There is no tenderness. There is no rigidity, no rebound, no guarding, no CVA tenderness, no tenderness at McBurney's point and negative Murphy's sign. No hernia. Hernia confirmed negative in the ventral area, confirmed negative in the right inguinal area and confirmed negative in the left inguinal area.  Genitourinary: Rectum normal, prostate normal, testes normal and penis normal. Rectal exam shows no external hemorrhoid, no internal hemorrhoid, no fissure, no mass, no tenderness and anal tone normal. Guaiac negative stool. Prostate is not enlarged and not tender. Right testis  shows no mass, no swelling and no tenderness. Right testis is descended.  Left testis shows no mass, no swelling and no tenderness. Left testis is descended. Circumcised. No penile erythema or penile tenderness. No discharge found.  Musculoskeletal: Normal range of motion. He exhibits no edema and no tenderness.       Thoracic back: Normal. He exhibits normal range of motion, no tenderness, no bony tenderness, no swelling and no edema.       Lumbar back: Normal.  Lymphadenopathy:    He has no cervical adenopathy.       Right: No inguinal adenopathy present.       Left: No inguinal adenopathy present.  Neurological: He is oriented to person, place, and time.  Skin: Skin is warm and dry. No rash noted. He is not diaphoretic. No erythema. No pallor.  Psychiatric: He has a normal mood and affect. His behavior is normal. Judgment and thought content normal.      Lab Results  Component Value Date   WBC 4.0* 10/25/2009   HGB 13.9 10/25/2009   HCT 41.6 10/25/2009   PLT 163.0 10/25/2009   GLUCOSE 91 10/25/2009   CHOL 153 10/25/2009   TRIG 81.0 10/25/2009   HDL 37.80* 10/25/2009   LDLCALC 99 10/25/2009   ALT 32 10/25/2009   AST 23 10/25/2009   NA 140 10/25/2009   K 4.3 10/25/2009   CL 108 10/25/2009   CREATININE 0.9 10/25/2009   BUN 10 10/25/2009   CO2 28 10/25/2009   TSH 2.15 10/25/2009   HGBA1C 5.5 10/25/2009      Assessment & Plan:

## 2012-10-23 NOTE — Assessment & Plan Note (Signed)
Exam done Labs ordered Vaccines were updated Pt ed material was given 

## 2012-10-23 NOTE — Assessment & Plan Note (Signed)
I do not see anything acute about his pain or exam today. I will check labs and xrays to look for secondary causes.

## 2012-10-24 ENCOUNTER — Other Ambulatory Visit (INDEPENDENT_AMBULATORY_CARE_PROVIDER_SITE_OTHER): Payer: PRIVATE HEALTH INSURANCE

## 2012-10-24 ENCOUNTER — Encounter: Payer: Self-pay | Admitting: Internal Medicine

## 2012-10-24 ENCOUNTER — Ambulatory Visit: Payer: PRIVATE HEALTH INSURANCE | Admitting: Internal Medicine

## 2012-10-24 ENCOUNTER — Ambulatory Visit (INDEPENDENT_AMBULATORY_CARE_PROVIDER_SITE_OTHER): Payer: PRIVATE HEALTH INSURANCE | Admitting: Internal Medicine

## 2012-10-24 VITALS — BP 108/64 | HR 80 | Temp 98.1°F | Resp 16 | Wt 254.8 lb

## 2012-10-24 DIAGNOSIS — R079 Chest pain, unspecified: Secondary | ICD-10-CM

## 2012-10-24 DIAGNOSIS — R10814 Left lower quadrant abdominal tenderness: Secondary | ICD-10-CM

## 2012-10-24 LAB — CBC WITH DIFFERENTIAL/PLATELET
Basophils Relative: 0.2 % (ref 0.0–3.0)
Eosinophils Absolute: 0.2 10*3/uL (ref 0.0–0.7)
Hemoglobin: 14.2 g/dL (ref 13.0–17.0)
Lymphs Abs: 1.7 10*3/uL (ref 0.7–4.0)
MCHC: 33.7 g/dL (ref 30.0–36.0)
MCV: 88.4 fl (ref 78.0–100.0)
Monocytes Absolute: 0.4 10*3/uL (ref 0.1–1.0)
Neutro Abs: 3.7 10*3/uL (ref 1.4–7.7)
RBC: 4.75 Mil/uL (ref 4.22–5.81)

## 2012-10-24 LAB — TROPONIN I: Troponin I: <0.01 ng/mL (ref <0.06)

## 2012-10-24 NOTE — Progress Notes (Signed)
Subjective:    Patient ID: Henry Lang, male    DOB: December 07, 1960, 52 y.o.   MRN: [id number]  Chest Pain  This is a new problem. Episode onset: for 2 days. The onset quality is sudden. The problem occurs intermittently. The problem has been gradually improving. Pain location: upper anterior chest on both sides. The pain is at a severity of 1/10. The pain is mild. The quality of the pain is described as dull. The pain does not radiate. Associated symptoms include nausea. Pertinent negatives include no abdominal pain, back pain, claudication, cough, diaphoresis, dizziness, exertional chest pressure, fever, headaches, hemoptysis, irregular heartbeat, leg pain, lower extremity edema, malaise/fatigue, near-syncope, numbness, orthopnea, palpitations, PND, shortness of breath, sputum production, syncope, vomiting or weakness. The pain is aggravated by nothing. Treatments tried: norco and zofran. Risk factors include obesity, male gender and stress. Prior workup: he went to [facility] 2 days ago and tells me that labs, CXR and CT scan were all normal.      Review of Systems  Constitutional: Negative for fever, chills, malaise/fatigue, diaphoresis, activity change, appetite change, fatigue and unexpected weight change.  HENT: Negative.   Eyes: Negative.   Respiratory: Negative for apnea, cough, hemoptysis, sputum production, choking, chest tightness, shortness of breath, wheezing and stridor.   Cardiovascular: Positive for chest pain. Negative for palpitations, orthopnea, claudication, leg swelling, syncope, PND and near-syncope.  Gastrointestinal: Positive for nausea. Negative for vomiting, abdominal pain, diarrhea, constipation, blood in stool and anal bleeding.  Endocrine: Negative.   Genitourinary: Negative.   Musculoskeletal: Negative for myalgias, back pain, joint swelling, arthralgias and gait problem.  Skin: Negative.   Neurological: Negative for dizziness, weakness, light-headedness, numbness  and headaches.  Hematological: Negative for adenopathy. Does not bruise/bleed easily.  Psychiatric/Behavioral: Negative.        Objective:   Physical Exam  Vitals reviewed. Constitutional: He is oriented to person, place, and time. He appears well-developed and well-nourished.  Non-toxic appearance. He does not have a sickly appearance. He does not appear ill. No distress.  HENT:  Head: Normocephalic and atraumatic.  Mouth/Throat: Oropharynx is clear and moist. No oropharyngeal exudate.  Eyes: Conjunctivae are normal. Right eye exhibits no discharge. Left eye exhibits no discharge. No scleral icterus.  Neck: Normal range of motion. Neck supple. No JVD present. No tracheal deviation present. No thyromegaly present.  Cardiovascular: Normal rate, regular rhythm, normal heart sounds and intact distal pulses.  Exam reveals no gallop and no friction rub.   No murmur heard. Pulmonary/Chest: Effort normal and breath sounds normal. No stridor. No respiratory distress. He has no wheezes. He has no rales. He exhibits no tenderness.  Abdominal: Soft. Bowel sounds are normal. He exhibits no distension and no mass. There is no tenderness. There is no rebound and no guarding.  Musculoskeletal: Normal range of motion. He exhibits no edema and no tenderness.  Lymphadenopathy:    He has no cervical adenopathy.  Neurological: He is oriented to person, place, and time.  Skin: Skin is warm and dry. No rash noted. He is not diaphoretic. No erythema. No pallor.  Psychiatric: He has a normal mood and affect. His behavior is normal. Judgment and thought content normal.    Lab Results  Component Value Date   WBC 6.6 10/21/2012   HGB 13.6 10/21/2012   HCT 40.3 10/21/2012   PLT 198.0 10/21/2012   GLUCOSE 102* 10/21/2012   CHOL 150 10/21/2012   TRIG 105.0 10/21/2012   HDL 31.40* 10/21/2012   LDLCALC 98  10/21/2012   ALT 26 10/21/2012   AST 20 10/21/2012   NA 138 10/21/2012   K 4.3 10/21/2012   CL 106 10/21/2012   CREATININE 0.9  10/21/2012   BUN 14 10/21/2012   CO2 25 10/21/2012   TSH 2.85 10/21/2012   PSA 0.65 10/21/2012   HGBA1C 5.5 10/25/2009   Dg Abd Acute W/chest  10/21/2012  *RADIOLOGY REPORT*  Clinical Data: 52 year old male abdominal pain.  Left lower quadrant tenderness.  ACUTE ABDOMEN SERIES (ABDOMEN 2 VIEW & CHEST 1 VIEW)  Comparison: 10/09/2009 and earlier.  Findings: Slightly lower lung volumes.  Cardiac size and mediastinal contours are within normal limits.  Visualized tracheal air column is within normal limits.  No pneumothorax or pneumoperitoneum.  No confluent pulmonary opacity.  Nonobstructed bowel gas pattern.  Retained stool the colon. Abdominal and pelvic visceral contours are within normal limits. No acute osseous abnormality identified.  IMPRESSION: Nonobstructed bowel gas pattern, no free air. No acute cardiopulmonary abnormality.     Original Report Authenticated By: Erskine Speed, M.D.       Assessment & Plan:

## 2012-10-24 NOTE — Patient Instructions (Signed)
Chest Pain (Nonspecific) It is often hard to give a specific diagnosis for the cause of chest pain. There is always a chance that your pain could be related to something serious, such as a heart attack or a blood clot in the lungs. You need to follow up with your caregiver for further evaluation. CAUSES   Heartburn.  Pneumonia or bronchitis.  Anxiety or stress.  Inflammation around your heart (pericarditis) or lung (pleuritis or pleurisy).  A blood clot in the lung.  A collapsed lung (pneumothorax). It can develop suddenly on its own (spontaneous pneumothorax) or from injury (trauma) to the chest.  Shingles infection (herpes zoster virus). The chest wall is composed of bones, muscles, and cartilage. Any of these can be the source of the pain.  The bones can be bruised by injury.  The muscles or cartilage can be strained by coughing or overwork.  The cartilage can be affected by inflammation and become sore (costochondritis). DIAGNOSIS  Lab tests or other studies, such as X-rays, electrocardiography, stress testing, or cardiac imaging, may be needed to find the cause of your pain.  TREATMENT   Treatment depends on what may be causing your chest pain. Treatment may include:  Acid blockers for heartburn.  Anti-inflammatory medicine.  Pain medicine for inflammatory conditions.  Antibiotics if an infection is present.  You may be advised to change lifestyle habits. This includes stopping smoking and avoiding alcohol, caffeine, and chocolate.  You may be advised to keep your head raised (elevated) when sleeping. This reduces the chance of acid going backward from your stomach into your esophagus.  Most of the time, nonspecific chest pain will improve within 2 to 3 days with rest and mild pain medicine. HOME CARE INSTRUCTIONS   If antibiotics were prescribed, take your antibiotics as directed. Finish them even if you start to feel better.  For the next few days, avoid physical  activities that bring on chest pain. Continue physical activities as directed.  Do not smoke.  Avoid drinking alcohol.  Only take over-the-counter or prescription medicine for pain, discomfort, or fever as directed by your caregiver.  Follow your caregiver's suggestions for further testing if your chest pain does not go away.  Keep any follow-up appointments you made. If you do not go to an appointment, you could develop lasting (chronic) problems with pain. If there is any problem keeping an appointment, you must call to reschedule. SEEK MEDICAL CARE IF:   You think you are having problems from the medicine you are taking. Read your medicine instructions carefully.  Your chest pain does not go away, even after treatment.  You develop a rash with blisters on your chest. SEEK IMMEDIATE MEDICAL CARE IF:   You have increased chest pain or pain that spreads to your arm, neck, jaw, back, or abdomen.  You develop shortness of breath, an increasing cough, or you are coughing up blood.  You have severe back or abdominal pain, feel nauseous, or vomit.  You develop severe weakness, fainting, or chills.  You have a fever. THIS IS AN EMERGENCY. Do not wait to see if the pain will go away. Get medical help at once. Call your local emergency services (911 in [place] Do not drive yourself to the hospital. MAKE SURE YOU:   Understand these instructions.  Will watch your condition.  Will get help right away if you are not doing well or get worse. Document Released: 06/10/2005 Document Revised: 11/23/2011 Document Reviewed: 04/05/2008 Exit[employer] Patient Information 2013 [employer].  

## 2012-10-25 ENCOUNTER — Ambulatory Visit: Payer: PRIVATE HEALTH INSURANCE | Admitting: Internal Medicine

## 2012-10-25 NOTE — Assessment & Plan Note (Signed)
His EKG is normal He tells me that a CXR and labs was normal 2 days ago I will check his cardiac enzymes today

## 2012-10-25 NOTE — Assessment & Plan Note (Signed)
The pain has resolved He was seen in the ER 2 days ago and he tells me that a CT-Abd was normal (I have no records today)

## 2014-02-13 ENCOUNTER — Encounter: Payer: Self-pay | Admitting: Internal Medicine

## 2014-04-16 ENCOUNTER — Ambulatory Visit (AMBULATORY_SURGERY_CENTER): Payer: Self-pay | Admitting: *Deleted

## 2014-04-16 VITALS — Ht 72.0 in | Wt 255.8 lb

## 2014-04-16 DIAGNOSIS — Z800 Family history of malignant neoplasm of digestive organs: Secondary | ICD-10-CM

## 2014-04-16 DIAGNOSIS — Z1211 Encounter for screening for malignant neoplasm of colon: Secondary | ICD-10-CM

## 2014-04-16 MED ORDER — MOVIPREP 100 G PO SOLR: 1.0000 | Freq: Once | ORAL | Status: DC

## 2014-04-16 NOTE — Progress Notes (Signed)
Denies allergies to eggs or soy products. Denies complications with sedation or anesthesia. Denies O2 use. Denies use of diet or weight loss medications.  Emmi instructions given for colonoscopy.  

## 2014-04-30 ENCOUNTER — Encounter: Payer: Self-pay | Admitting: Internal Medicine

## 2014-04-30 ENCOUNTER — Ambulatory Visit (AMBULATORY_SURGERY_CENTER): Payer: PRIVATE HEALTH INSURANCE | Admitting: Internal Medicine

## 2014-04-30 VITALS — BP 102/75 | HR 71 | Temp 97.6°F | Resp 19 | Ht 72.0 in | Wt 255.0 lb

## 2014-04-30 DIAGNOSIS — D126 Benign neoplasm of colon, unspecified: Secondary | ICD-10-CM

## 2014-04-30 DIAGNOSIS — Z1211 Encounter for screening for malignant neoplasm of colon: Secondary | ICD-10-CM

## 2014-04-30 DIAGNOSIS — Z800 Family history of malignant neoplasm of digestive organs: Secondary | ICD-10-CM

## 2014-04-30 MED ORDER — SODIUM CHLORIDE 0.9 % IV SOLN: 500.0000 mL | INTRAVENOUS | Status: DC

## 2014-04-30 NOTE — Op Note (Signed)
[facility]  [address] [place], [id number]   COLONOSCOPY PROCEDURE REPORT  PATIENT: Henry Lang, Henry Lang  MR#: [id number] BIRTHDATE: February 25, 1961 , 52  yrs. old GENDER: Male ENDOSCOPIST: Jerene Bears, MD REFERRED [id number] Evalina Field, M.D. PROCEDURE DATE:  04/30/2014 PROCEDURE:   Colonoscopy with snare polypectomy and Colonoscopy with cold biopsy polypectomy First Screening Colonoscopy - Avg.  risk and is 50 yrs.  old or older - No.  Prior Negative Screening - Now for repeat screening. N/A  History of Adenoma - Now for follow-up colonoscopy & has been > or = to 3 yrs.  N/A  Polyps Removed Today? Yes. ASA CLASS:   Class II INDICATIONS:elevated risk screening, Patient's immediate family history of colon cancer, and first colonoscopy. MEDICATIONS: MAC sedation, administered by CRNA and propofol (Diprivan) 260mg  IV  DESCRIPTION OF PROCEDURE:   After the risks benefits and alternatives of the procedure were thoroughly explained, informed consent was obtained.  A digital rectal exam revealed no rectal mass.   The LB [id number]  endoscope was introduced through the anus and advanced to the cecum, which was identified by both the appendix and ileocecal valve. No adverse events experienced. The quality of the prep was good, using MoviPrep  The instrument was then slowly withdrawn as the colon was fully examined.   COLON FINDINGS: Three sessile polyps ranging between 3-72mm in size were found in the ascending colon and transverse colon. Polypectomy was performed with cold forceps and using cold snare. All resections were complete and all polyp tissue was completely retrieved.   The colon mucosa was otherwise normal.  Retroflexed views revealed no abnormalities. The time to cecum=3 minutes 11 seconds.  Withdrawal time=14 minutes 20 seconds.  The scope was withdrawn and the procedure completed. COMPLICATIONS: There were no complications.  ENDOSCOPIC IMPRESSION: 1.    Three sessile polyps ranging between 3-96mm in size were found in the ascending colon and transverse colon; Polypectomy was performed with cold forceps and using cold snare 2.   The colon mucosa was otherwise normal  RECOMMENDATIONS: 1.  Avoid all NSAIDS for the next 2 weeks. 2.  Repeat Colonoscopy in 5 years. 3.  You will receive a letter within 1-2 weeks with the results of your biopsy as well as final recommendations.  Please call my office if you have not received a letter after 3 weeks.   eSigned:  Jerene Bears, MD 04/30/2014 9:44 AM  cc: The Patient and Janith Lima, MD

## 2014-04-30 NOTE — Patient Instructions (Signed)
YOU HAD AN ENDOSCOPIC PROCEDURE TODAY AT [facility]: Refer to the procedure report that was given to you for any specific questions about what was found during the examination.  If the procedure report does not answer your questions, please call your gastroenterologist to clarify.  If you requested that your care partner not be given the details of your procedure findings, then the procedure report has been included in a sealed envelope for you to review at your convenience later.  YOU SHOULD EXPECT: Some feelings of bloating in the abdomen. Passage of more gas than usual.  Walking can help get rid of the air that was put into your GI tract during the procedure and reduce the bloating. If you had a lower endoscopy (such as a colonoscopy or flexible sigmoidoscopy) you may notice spotting of blood in your stool or on the toilet paper. If you underwent a bowel prep for your procedure, then you may not have a normal bowel movement for a few days.  DIET: Your first meal following the procedure should be a light meal and then it is ok to progress to your normal diet.  A half-sandwich or bowl of soup is an example of a good first meal.  Heavy or fried foods are harder to digest and may make you feel nauseous or bloated.  Likewise meals heavy in dairy and vegetables can cause extra gas to form and this can also increase the bloating.  Drink plenty of fluids but you should avoid alcoholic beverages for 24 hours.  ACTIVITY: Your care partner should take you home directly after the procedure.  You should plan to take it easy, moving slowly for the rest of the day.  You can resume normal activity the day after the procedure however you should NOT DRIVE or use heavy machinery for 24 hours (because of the sedation medicines used during the test).    SYMPTOMS TO REPORT IMMEDIATELY: A gastroenterologist can be reached at any hour.  During normal business hours, 8:30 AM to 5:00 PM Monday through Friday,  call [phone].  After hours and on weekends, please call the GI answering service at [phone] who will take a message and have the physician on call contact you.   Following lower endoscopy (colonoscopy or flexible sigmoidoscopy):  Excessive amounts of blood in the stool  Significant tenderness or worsening of abdominal pains  Swelling of the abdomen that is new, acute  Fever of 100F or higher   FOLLOW UP: If any biopsies were taken you will be contacted by phone or by letter within the next 1-3 weeks.  Call your gastroenterologist if you have not heard about the biopsies in 3 weeks.  Our staff will call the home number listed on your records the next business day following your procedure to check on you and address any questions or concerns that you may have at that time regarding the information given to you following your procedure. This is a courtesy call and so if there is no answer at the home number and we have not heard from you through the emergency physician on call, we will assume that you have returned to your regular daily activities without incident.      SIGNATURES/CONFIDENTIALITY: You and/or your care partner have signed paperwork which will be entered into your electronic medical record.  These signatures attest to the fact that that the information above on your After Visit Summary has been reviewed and is understood.  Full responsibility  of the confidentiality of this discharge information lies with you and/or your care-partner.   INFORMATION ON POLYPS GIVEN TO YOU TODAY  NO ASPIRIN OR ANTI INFLAMMATORY PRODUCTS FOR 2 WEEKS PER DR PYRTLE

## 2014-04-30 NOTE — Progress Notes (Signed)
Procedure ends, to recovery, report given and VSS. 

## 2014-04-30 NOTE — Progress Notes (Signed)
Called to room to assist during endoscopic procedure.  Patient ID and intended procedure confirmed with present staff. Received instructions for my participation in the procedure from the performing physician.  

## 2014-05-01 ENCOUNTER — Telehealth: Payer: Self-pay | Admitting: *Deleted

## 2014-05-01 NOTE — Telephone Encounter (Signed)
  Follow up Call-  Call back number 04/30/2014  Post procedure Call Back phone  # [phone]  Permission to leave phone message Yes     Patient questions:  Do you have a fever, pain , or abdominal swelling? No. Pain Score  0 *  Have you tolerated food without any problems? Yes.    Have you been able to return to your normal activities? Yes.    Do you have any questions about your discharge instructions: Diet   No. Medications  No. Follow up visit  No.  Do you have questions or concerns about your Care? No.  Actions: * If pain score is 4 or above: No action needed, pain <4.

## 2014-05-07 ENCOUNTER — Encounter: Payer: Self-pay | Admitting: Internal Medicine

## 2014-05-08 LAB — HM COLONOSCOPY

## 2016-08-04 ENCOUNTER — Ambulatory Visit (INDEPENDENT_AMBULATORY_CARE_PROVIDER_SITE_OTHER)
Admission: RE | Admit: 2016-08-04 | Discharge: 2016-08-04 | Disposition: A | Discharge status: Home / Self Care | Payer: BLUE CROSS/BLUE SHIELD | Source: Ambulatory Visit | Attending: Internal Medicine | Admitting: Internal Medicine

## 2016-08-04 ENCOUNTER — Ambulatory Visit (INDEPENDENT_AMBULATORY_CARE_PROVIDER_SITE_OTHER): Payer: BLUE CROSS/BLUE SHIELD | Admitting: Internal Medicine

## 2016-08-04 ENCOUNTER — Encounter: Payer: Self-pay | Admitting: Internal Medicine

## 2016-08-04 VITALS — BP 118/72 | HR 69 | Temp 98.6°F | Resp 16 | Ht 72.0 in | Wt 234.0 lb

## 2016-08-04 DIAGNOSIS — M160 Bilateral primary osteoarthritis of hip: Secondary | ICD-10-CM | POA: Diagnosis not present

## 2016-08-04 DIAGNOSIS — M545 Low back pain: Secondary | ICD-10-CM

## 2016-08-04 DIAGNOSIS — M25551 Pain in right hip: Secondary | ICD-10-CM | POA: Diagnosis not present

## 2016-08-04 DIAGNOSIS — M79605 Pain in left leg: Secondary | ICD-10-CM

## 2016-08-04 DIAGNOSIS — G4733 Obstructive sleep apnea (adult) (pediatric): Secondary | ICD-10-CM | POA: Diagnosis not present

## 2016-08-04 DIAGNOSIS — M25552 Pain in left hip: Secondary | ICD-10-CM | POA: Diagnosis not present

## 2016-08-04 MED ORDER — TAPENTADOL HCL ER 100 MG PO TB12: 100.0000 mg | ORAL_TABLET | Freq: Two times a day (BID) | ORAL | 0 refills | Status: DC

## 2016-08-04 NOTE — Progress Notes (Signed)
Subjective:  Patient ID: Henry Lang, male    DOB: 19-Dec-1960  Age: 55 y.o. MRN: [id number]  CC: Back Pain   HPI Henry Lang presents for Concerns about a 2 month history of low back pain that radiates into his left lower extremity with left lower extremity weakness. He also complains of bilateral hip pain. He describes his pain as mild and stabbing during the day but the pain keeps him awake at night and interferes with his ability to sleep. He has tried over-the-counter doses of Tylenol and ibuprofen without much relief from his symptoms.  He also complains of fatigue over the last year. He has a history of sleep apnea which was previously treated with the surgical intervention. He does complain of persistent snoring, excessive daytime sleepiness, and fatigue.  Outpatient Medications Prior to Visit  Medication Sig Dispense Refill  . ibuprofen (ADVIL,MOTRIN) 800 MG tablet Take 800 mg by mouth 2 (two) times daily.     No facility-administered medications prior to visit.     ROS Review of Systems  Constitutional: Positive for fatigue. Negative for activity change, appetite change, diaphoresis and unexpected weight change.  HENT: Negative.   Eyes: Negative for visual disturbance.  Respiratory: Positive for apnea. Negative for cough, chest tightness, shortness of breath and wheezing.   Cardiovascular: Negative for chest pain, palpitations and leg swelling.  Gastrointestinal: Negative.  Negative for abdominal pain, constipation, diarrhea, nausea and vomiting.  Endocrine: Negative.   Genitourinary: Negative.   Musculoskeletal: Positive for arthralgias and back pain. Negative for joint swelling, myalgias and neck pain.  Skin: Negative.   Allergic/Immunologic: Negative.   Neurological: Positive for weakness (LLE). Negative for dizziness, tremors, light-headedness, numbness and headaches.  Hematological: Negative.  Negative for adenopathy. Does not bruise/bleed easily.    Psychiatric/Behavioral: Positive for sleep disturbance. Negative for decreased concentration and dysphoric mood. The patient is not nervous/anxious.     Objective:  BP 118/72 (BP Location: Left Arm, Patient Position: Sitting, Cuff Size: Normal)   Pulse 69   Temp 98.6 F (37 C) (Oral)   Resp 16   Ht 6' (1.829 m)   Wt 234 lb (106.1 kg)   SpO2 96%   BMI 31.74 kg/m   BP Readings from Last 3 Encounters:  08/04/16 118/72  04/30/14 102/75  10/24/12 108/64    Wt Readings from Last 3 Encounters:  08/04/16 234 lb (106.1 kg)  04/30/14 255 lb (115.7 kg)  04/16/14 255 lb 12.8 oz (116 kg)    Physical Exam  Constitutional: He is oriented to person, place, and time. No distress.  HENT:  Mouth/Throat: Oropharynx is clear and moist. No oropharyngeal exudate.  Eyes: Conjunctivae are normal. Right eye exhibits no discharge. Left eye exhibits no discharge. No scleral icterus.  Neck: Normal range of motion. Neck supple. No JVD present. No tracheal deviation present. No thyromegaly present.  Cardiovascular: Normal rate, regular rhythm, normal heart sounds and intact distal pulses.  Exam reveals no gallop and no friction rub.   No murmur heard. Pulmonary/Chest: Effort normal and breath sounds normal. No stridor. No respiratory distress. He has no wheezes. He has no rales. He exhibits no tenderness.  Abdominal: Soft. Bowel sounds are normal. He exhibits no distension and no mass. There is no tenderness. There is no rebound and no guarding.  Musculoskeletal: Normal range of motion. He exhibits no edema, tenderness or deformity.       Right hip: Normal. He exhibits normal range of motion, no tenderness, no bony  tenderness, no swelling and no deformity.       Left hip: He exhibits normal range of motion, no tenderness, no bony tenderness, no swelling and no deformity.       Lumbar back: Normal. He exhibits normal range of motion, no tenderness, no bony tenderness, no swelling, no edema, no deformity  and no pain.  Lymphadenopathy:    He has no cervical adenopathy.  Neurological: He is oriented to person, place, and time. He has normal strength. He displays no atrophy and no tremor. No cranial nerve deficit or sensory deficit. He exhibits normal muscle tone. He displays a negative Romberg sign. He displays no seizure activity. Coordination and gait normal.  Neg SLR in BLE  Skin: Skin is warm and dry. No rash noted. He is not diaphoretic. No erythema. No pallor.    Lab Results  Component Value Date   WBC 6.0 10/24/2012   HGB 14.2 10/24/2012   HCT 42.0 10/24/2012   PLT 201.0 10/24/2012   GLUCOSE 102 (H) 10/21/2012   CHOL 150 10/21/2012   TRIG 105.0 10/21/2012   HDL 31.40 (L) 10/21/2012   LDLCALC 98 10/21/2012   ALT 26 10/21/2012   AST 20 10/21/2012   NA 138 10/21/2012   K 4.3 10/21/2012   CL 106 10/21/2012   CREATININE 0.9 10/21/2012   BUN 14 10/21/2012   CO2 25 10/21/2012   TSH 2.85 10/21/2012   PSA 0.65 10/21/2012   HGBA1C 5.5 10/25/2009    Dg Abd Acute W/chest  Result Date: 10/21/2012 *RADIOLOGY REPORT* Clinical Data: 55 year old male abdominal pain.  Left lower quadrant tenderness. ACUTE ABDOMEN SERIES (ABDOMEN 2 VIEW & CHEST 1 VIEW) Comparison: 10/09/2009 and earlier. Findings: Slightly lower lung volumes.  Cardiac size and mediastinal contours are within normal limits.  Visualized tracheal air column is within normal limits.  No pneumothorax or pneumoperitoneum.  No confluent pulmonary opacity. Nonobstructed bowel gas pattern.  Retained stool the colon. Abdominal and pelvic visceral contours are within normal limits. No acute osseous abnormality identified. IMPRESSION: Nonobstructed bowel gas pattern, no free air. No acute cardiopulmonary abnormality.  Original Report Authenticated By: Roselyn Reef, M.D.    Assessment & Plan:   Lowery was seen today for back pain.  Diagnoses and all orders for this visit:  OSA (obstructive sleep apnea)- I think this is the most likely  estimation for his fatigue. I've asked him to follow-up with sleep medicine to consider having this evaluated and treated. -     Ambulatory referral to Sleep Studies  Low back pain radiating to left leg- plain x-rays showed DDD and disc space narrowing at L4-L5. I've ordered an MRI to see if he has spinal stenosis, nerve impingement, or disc herniation. For now will control the pain with Nucynta ER. -     MR LUMBAR SPINE WO CONTRAST; Future -     DG Lumbar Spine Complete; Future -     tapentadol (NUCYNTA ER) 100 MG 12 hr tablet; Take 1 tablet (100 mg total) by mouth every 12 (twelve) hours.  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 -     DG HIPS BILAT W OR W/O PELVIS MIN 5 VIEWS; Future -     tapentadol (NUCYNTA ER) 100 MG 12 hr tablet; Take 1 tablet (100 mg total) by mouth every 12 (twelve) hours.   I am  having Mr. Pennisi start on tapentadol. I am also having him maintain his ibuprofen.  Meds ordered this encounter  Medications  . tapentadol (NUCYNTA ER) 100 MG 12 hr tablet    Sig: Take 1 tablet (100 mg total) by mouth every 12 (twelve) hours.    Dispense:  60 tablet    Refill:  0     Follow-up: Return in about 4 weeks (around 09/01/2016).  Scarlette Calico, MD

## 2016-08-04 NOTE — Progress Notes (Signed)
Pre visit review using our clinic review tool, if applicable. No additional management support is needed unless otherwise documented below in the visit note. 

## 2016-08-04 NOTE — Patient Instructions (Signed)

## 2016-08-05 ENCOUNTER — Encounter: Payer: Self-pay | Admitting: Internal Medicine

## 2016-08-10 ENCOUNTER — Other Ambulatory Visit: Payer: Self-pay | Admitting: Internal Medicine

## 2016-08-10 DIAGNOSIS — Z77018 Contact with and (suspected) exposure to other hazardous metals: Secondary | ICD-10-CM

## 2016-08-19 ENCOUNTER — Ambulatory Visit
Admission: RE | Admit: 2016-08-19 | Discharge: 2016-08-19 | Disposition: A | Discharge status: Home / Self Care | Payer: BLUE CROSS/BLUE SHIELD | Source: Ambulatory Visit | Attending: Internal Medicine | Admitting: Internal Medicine

## 2016-08-19 DIAGNOSIS — Z01818 Encounter for other preprocedural examination: Secondary | ICD-10-CM | POA: Diagnosis not present

## 2016-08-19 DIAGNOSIS — Z77018 Contact with and (suspected) exposure to other hazardous metals: Secondary | ICD-10-CM

## 2016-08-19 DIAGNOSIS — M545 Low back pain, unspecified: Secondary | ICD-10-CM

## 2016-08-19 DIAGNOSIS — M79605 Pain in left leg: Secondary | ICD-10-CM

## 2016-08-19 DIAGNOSIS — M48061 Spinal stenosis, lumbar region without neurogenic claudication: Secondary | ICD-10-CM | POA: Diagnosis not present

## 2016-08-20 ENCOUNTER — Encounter: Payer: Self-pay | Admitting: Internal Medicine

## 2016-08-26 ENCOUNTER — Ambulatory Visit (INDEPENDENT_AMBULATORY_CARE_PROVIDER_SITE_OTHER): Payer: BLUE CROSS/BLUE SHIELD | Admitting: Internal Medicine

## 2016-08-26 ENCOUNTER — Encounter: Payer: Self-pay | Admitting: Internal Medicine

## 2016-08-26 ENCOUNTER — Other Ambulatory Visit (INDEPENDENT_AMBULATORY_CARE_PROVIDER_SITE_OTHER): Payer: BLUE CROSS/BLUE SHIELD

## 2016-08-26 VITALS — BP 126/78 | HR 90 | Temp 98.3°F | Resp 16 | Ht 72.0 in | Wt 236.0 lb

## 2016-08-26 DIAGNOSIS — Z136 Encounter for screening for cardiovascular disorders: Secondary | ICD-10-CM

## 2016-08-26 DIAGNOSIS — E118 Type 2 diabetes mellitus with unspecified complications: Secondary | ICD-10-CM | POA: Insufficient documentation

## 2016-08-26 DIAGNOSIS — R7303 Prediabetes: Secondary | ICD-10-CM

## 2016-08-26 DIAGNOSIS — Z23 Encounter for immunization: Secondary | ICD-10-CM | POA: Diagnosis not present

## 2016-08-26 DIAGNOSIS — Z0000 Encounter for general adult medical examination without abnormal findings: Secondary | ICD-10-CM

## 2016-08-26 DIAGNOSIS — M48061 Spinal stenosis, lumbar region without neurogenic claudication: Secondary | ICD-10-CM | POA: Diagnosis not present

## 2016-08-26 LAB — TSH: TSH: 3.36 u[IU]/mL (ref 0.35–4.50)

## 2016-08-26 LAB — LIPID PANEL
CHOLESTEROL: 160 mg/dL (ref 0–200)
HDL: 42.6 mg/dL (ref >39.00)
LDL CALC: 93 mg/dL (ref 0–99)
NonHDL: 117.8
TRIGLYCERIDES: 122 mg/dL (ref 0.0–149.0)
Total CHOL/HDL Ratio: 4
VLDL: 24.4 mg/dL (ref 0.0–40.0)

## 2016-08-26 LAB — CBC WITH DIFFERENTIAL/PLATELET
BASOS ABS: 0 10*3/uL (ref 0.0–0.1)
BASOS PCT: 0.2 % (ref 0.0–3.0)
EOS ABS: 0.1 10*3/uL (ref 0.0–0.7)
Eosinophils Relative: 1.2 % (ref 0.0–5.0)
HEMATOCRIT: 42.6 % (ref 39.0–52.0)
HEMOGLOBIN: 14.5 g/dL (ref 13.0–17.0)
LYMPHS PCT: 32.1 % (ref 12.0–46.0)
Lymphs Abs: 1.9 10*3/uL (ref 0.7–4.0)
MCHC: 34 g/dL (ref 30.0–36.0)
MCV: 89.5 fl (ref 78.0–100.0)
Monocytes Absolute: 0.5 10*3/uL (ref 0.1–1.0)
Monocytes Relative: 8.8 % (ref 3.0–12.0)
Neutro Abs: 3.5 10*3/uL (ref 1.4–7.7)
Neutrophils Relative %: 57.7 % (ref 43.0–77.0)
Platelets: 189 10*3/uL (ref 150.0–400.0)
RBC: 4.76 Mil/uL (ref 4.22–5.81)
RDW: 13.4 % (ref 11.5–15.5)
WBC: 6 10*3/uL (ref 4.0–10.5)

## 2016-08-26 LAB — PSA: PSA: 0.69 ng/mL (ref 0.10–4.00)

## 2016-08-26 LAB — COMPREHENSIVE METABOLIC PANEL
ALBUMIN: 4.6 g/dL (ref 3.5–5.2)
ALK PHOS: 57 U/L (ref 39–117)
ALT: 22 U/L (ref 0–53)
AST: 17 U/L (ref 0–37)
BUN: 16 mg/dL (ref 6–23)
CALCIUM: 9.8 mg/dL (ref 8.4–10.5)
CHLORIDE: 108 meq/L (ref 96–112)
CO2: 28 mEq/L (ref 19–32)
CREATININE: 0.84 mg/dL (ref 0.40–1.50)
GFR: 101.2 mL/min (ref >60.00)
Glucose, Bld: 98 mg/dL (ref 70–99)
Potassium: 4.1 mEq/L (ref 3.5–5.1)
Sodium: 144 mEq/L (ref 135–145)
TOTAL PROTEIN: 7.1 g/dL (ref 6.0–8.3)
Total Bilirubin: 0.4 mg/dL (ref 0.2–1.2)

## 2016-08-26 LAB — HEMOGLOBIN A1C: HEMOGLOBIN A1C: 5.5 % (ref 4.6–6.5)

## 2016-08-26 MED ORDER — TAPENTADOL HCL 50 MG PO TABS: 50.0000 mg | ORAL_TABLET | Freq: Four times a day (QID) | ORAL | 0 refills | Status: DC | PRN

## 2016-08-26 NOTE — Progress Notes (Signed)
Subjective:  Patient ID: Henry Lang, male    DOB: 08-31-1961  Age: 55 y.o. MRN: [id number]  CC: Annual Exam and Back Pain   HPI Henry Lang presents for a CPX.  He continues to have low back pain that keeps him awake at night. He never got the prescription for Nucynta because no one could find the extended release formulation but he was told that they did have the immediate release formulation. He is still taking ibuprofen but it doesn't offer much symptom relief. His MRI showed spinal stenosis. He denies any recent episodes of numbness/weakness/tingling in his lower extremities.  History Henry Lang has a past medical history of Chest pain, unspecified; Esophageal reflux; Lumbago; Nonspecific abnormal electrocardiogram (ECG) (EKG); Nonspecific abnormal results of liver function study; and Other abnormal glucose.   He has a past surgical history that includes Vasectomy (1986) and Vasectomy reversal.   His family history includes Cancer in his other; Colon cancer in his father, mother, and paternal uncle; Diabetes in his other; Hypertension in his other.He reports that he has never smoked. He has never used smokeless tobacco. He reports that he drinks alcohol. He reports that he does not use drugs.  Outpatient Medications Prior to Visit  Medication Sig Dispense Refill  . ibuprofen (ADVIL,MOTRIN) 800 MG tablet Take 800 mg by mouth 2 (two) times daily.    . tapentadol (NUCYNTA ER) 100 MG 12 hr tablet Take 1 tablet (100 mg total) by mouth every 12 (twelve) hours. (Patient not taking: Reported on 08/26/2016) 60 tablet 0   No facility-administered medications prior to visit.     ROS Review of Systems  Constitutional: Negative for activity change, appetite change, diaphoresis, fatigue and unexpected weight change.  HENT: Negative.  Negative for sinus pressure, sore throat and trouble swallowing.   Eyes: Negative for visual disturbance.  Respiratory: Negative for cough, chest tightness,  shortness of breath, wheezing and stridor.   Cardiovascular: Negative.  Negative for chest pain, palpitations and leg swelling.  Gastrointestinal: Negative.  Negative for abdominal pain, blood in stool, constipation, diarrhea, nausea and vomiting.  Genitourinary: Negative.  Negative for difficulty urinating, dysuria, flank pain, frequency, penile swelling, scrotal swelling, testicular pain and urgency.  Musculoskeletal: Positive for back pain. Negative for arthralgias, joint swelling, myalgias and neck pain.  Skin: Negative.   Neurological: Negative.  Negative for dizziness, weakness and numbness.  Hematological: Negative for adenopathy. Does not bruise/bleed easily.  Psychiatric/Behavioral: Negative.     Objective:  BP 126/78 (BP Location: Left Arm, Patient Position: Sitting, Cuff Size: Large)   Pulse 90   Temp 98.3 F (36.8 C) (Oral)   Resp 16   Ht 6' (1.829 m)   Wt 236 lb (107 kg)   SpO2 96%   BMI 32.01 kg/m   Physical Exam  Constitutional: He is oriented to person, place, and time. No distress.  HENT:  Mouth/Throat: Oropharynx is clear and moist. No oropharyngeal exudate.  Eyes: Conjunctivae are normal. Right eye exhibits no discharge. Left eye exhibits no discharge. No scleral icterus.  Neck: Normal range of motion. Neck supple. No JVD present. No tracheal deviation present. No thyromegaly present.  Cardiovascular: Normal rate, regular rhythm, normal heart sounds and intact distal pulses.  Exam reveals no gallop and no friction rub.   No murmur heard. EKG ---  Sinus  Rhythm  WITHIN NORMAL LIMITS  Pulmonary/Chest: Effort normal and breath sounds normal. No stridor. No respiratory distress. He has no wheezes. He has no rales. He exhibits  no tenderness.  Abdominal: Soft. Bowel sounds are normal. He exhibits no distension and no mass. There is no tenderness. There is no rebound and no guarding. Hernia confirmed negative in the right inguinal area.  Genitourinary: Rectum normal,  prostate normal and penis normal. Rectal exam shows no external hemorrhoid, no internal hemorrhoid, no fissure, no mass, no tenderness, anal tone normal and guaiac negative stool. Prostate is not enlarged and not tender. Cremasteric reflex is present. Right testis shows no mass, no swelling and no tenderness. Right testis is descended. Left testis shows no mass, no swelling and no tenderness. Left testis is descended. Circumcised. No penile erythema or penile tenderness. No discharge found.  Musculoskeletal: Normal range of motion. He exhibits no edema, tenderness or deformity.  Lymphadenopathy:    He has no cervical adenopathy.       Right: No inguinal adenopathy present.       Left: No inguinal adenopathy present.  Neurological: He is oriented to person, place, and time.  Skin: Skin is warm and dry. No rash noted. He is not diaphoretic. No erythema. No pallor.  Psychiatric: He has a normal mood and affect. His behavior is normal. Judgment and thought content normal.  Vitals reviewed.   Lab Results  Component Value Date   WBC 6.0 08/26/2016   HGB 14.5 08/26/2016   HCT 42.6 08/26/2016   PLT 189.0 08/26/2016   GLUCOSE 98 08/26/2016   CHOL 160 08/26/2016   TRIG 122.0 08/26/2016   HDL 42.60 08/26/2016   LDLCALC 93 08/26/2016   ALT 22 08/26/2016   AST 17 08/26/2016   NA 144 08/26/2016   K 4.1 08/26/2016   CL 108 08/26/2016   CREATININE 0.84 08/26/2016   BUN 16 08/26/2016   CO2 28 08/26/2016   TSH 3.36 08/26/2016   PSA 0.69 08/26/2016   HGBA1C 5.5 08/26/2016    Assessment & Plan:   Henry Lang was seen today for annual exam and back pain.  Diagnoses and all orders for this visit:  Routine general medical examination at a health care facility- he refuses to receive a flu vaccine today, he was given a Tdap Booster, exam completed, labs ordered and reviewed, his colonoscopy is up-to-date, patient education material was given. -     Lipid panel; Future -     Comprehensive metabolic  panel; Future -     CBC with Differential/Platelet; Future -     TSH; Future -     PSA; Future -     HIV antibody; Future -     Hepatitis C antibody; Future  Prediabetes- improvement noted -     Hemoglobin A1c; Future  Spinal stenosis, lumbar region, without neurogenic claudication- I'll change the Nucynta prescription to the immediate release, he will continue ibuprofen, will refer to pain clinic to see if epidural steroid injections will help them. -     tapentadol (NUCYNTA) 50 MG tablet; Take 1 tablet (50 mg total) by mouth every 6 (six) hours as needed. -     Ambulatory referral to Pain Clinic  Screening for heart disease- normal EKG -     EKG 12-Lead  Need for prophylactic vaccination with combined diphtheria-tetanus-pertussis (DTP) vaccine -     Tdap vaccine greater than or equal to 7yo IM   I have discontinued Mr. Chasen's ibuprofen and tapentadol. I am also having him start on tapentadol.  Meds ordered this encounter  Medications  . tapentadol (NUCYNTA) 50 MG tablet    Sig: Take 1 tablet (50  mg total) by mouth every 6 (six) hours as needed.    Dispense:  100 tablet    Refill:  0     Follow-up: Return if symptoms worsen or fail to improve.  Scarlette Calico, MD

## 2016-08-26 NOTE — Patient Instructions (Signed)

## 2016-08-26 NOTE — Progress Notes (Signed)
Pre visit review using our clinic review tool, if applicable. No additional management support is needed unless otherwise documented below in the visit note. 

## 2016-08-27 ENCOUNTER — Encounter: Payer: Self-pay | Admitting: Internal Medicine

## 2016-08-27 LAB — HIV ANTIBODY (ROUTINE TESTING W REFLEX): HIV: NONREACTIVE

## 2016-08-27 LAB — HEPATITIS C ANTIBODY: HCV AB: NEGATIVE

## 2016-08-31 ENCOUNTER — Telehealth: Payer: Self-pay | Admitting: *Deleted

## 2016-08-31 ENCOUNTER — Other Ambulatory Visit: Payer: Self-pay | Admitting: Internal Medicine

## 2016-08-31 DIAGNOSIS — M48061 Spinal stenosis, lumbar region without neurogenic claudication: Secondary | ICD-10-CM

## 2016-08-31 MED ORDER — HYDROCODONE-ACETAMINOPHEN 5-325 MG PO TABS: 1.0000 | ORAL_TABLET | Freq: Four times a day (QID) | ORAL | 0 refills | Status: DC | PRN

## 2016-08-31 NOTE — Telephone Encounter (Signed)
RX written 

## 2016-08-31 NOTE — Telephone Encounter (Signed)
Notified pt wife MD rx Hydrocodone rx has to be picked up at office...Henry Lang

## 2016-08-31 NOTE — Telephone Encounter (Signed)
Rec'd call from pt wife stating the Nucynta MD rx husband insurance does not cover, and he is needing something for pain...Henry Lang

## 2016-09-09 ENCOUNTER — Telehealth: Payer: Self-pay | Admitting: Internal Medicine

## 2016-09-09 NOTE — Telephone Encounter (Signed)
Spoke to pt and educated on the difference between the pain management and surgical consultation.   Pt stated he would like to do the pain management and then he may consider surgery.

## 2016-09-09 NOTE — Telephone Encounter (Signed)
Referred pt to [facility]. Inez Catalina from there states she called pt to schedule and he wants to be scheduled with Dr. Arnoldo Morale at [facility]. He does not do pain mgmt. We will need a referral for neurosurgery if pt wants to see him. Please advise

## 2016-09-10 NOTE — Telephone Encounter (Signed)
Left msg on vm for Henry Lang at [facility] for her to call pt back to schedule.

## 2016-09-29 DIAGNOSIS — G894 Chronic pain syndrome: Secondary | ICD-10-CM | POA: Diagnosis not present

## 2016-09-29 DIAGNOSIS — M47817 Spondylosis without myelopathy or radiculopathy, lumbosacral region: Secondary | ICD-10-CM | POA: Diagnosis not present

## 2016-09-29 DIAGNOSIS — Z79899 Other long term (current) drug therapy: Secondary | ICD-10-CM | POA: Diagnosis not present

## 2016-09-29 DIAGNOSIS — M5137 Other intervertebral disc degeneration, lumbosacral region: Secondary | ICD-10-CM | POA: Diagnosis not present

## 2016-09-29 DIAGNOSIS — Z79891 Long term (current) use of opiate analgesic: Secondary | ICD-10-CM | POA: Diagnosis not present

## 2016-09-29 DIAGNOSIS — M545 Low back pain: Secondary | ICD-10-CM | POA: Diagnosis not present

## 2016-10-08 DIAGNOSIS — M545 Low back pain: Secondary | ICD-10-CM | POA: Diagnosis not present

## 2016-10-08 DIAGNOSIS — M5137 Other intervertebral disc degeneration, lumbosacral region: Secondary | ICD-10-CM | POA: Diagnosis not present

## 2016-10-08 DIAGNOSIS — M47817 Spondylosis without myelopathy or radiculopathy, lumbosacral region: Secondary | ICD-10-CM | POA: Diagnosis not present

## 2016-10-13 DIAGNOSIS — M79609 Pain in unspecified limb: Secondary | ICD-10-CM | POA: Diagnosis not present

## 2016-10-13 DIAGNOSIS — M545 Low back pain: Secondary | ICD-10-CM | POA: Diagnosis not present

## 2016-10-19 DIAGNOSIS — J209 Acute bronchitis, unspecified: Secondary | ICD-10-CM | POA: Diagnosis not present

## 2016-10-27 DIAGNOSIS — G894 Chronic pain syndrome: Secondary | ICD-10-CM | POA: Diagnosis not present

## 2016-10-27 DIAGNOSIS — M545 Low back pain: Secondary | ICD-10-CM | POA: Diagnosis not present

## 2016-10-27 DIAGNOSIS — Z79891 Long term (current) use of opiate analgesic: Secondary | ICD-10-CM | POA: Diagnosis not present

## 2016-10-27 DIAGNOSIS — Z79899 Other long term (current) drug therapy: Secondary | ICD-10-CM | POA: Diagnosis not present

## 2016-10-27 DIAGNOSIS — M47816 Spondylosis without myelopathy or radiculopathy, lumbar region: Secondary | ICD-10-CM | POA: Diagnosis not present

## 2016-11-05 DIAGNOSIS — G894 Chronic pain syndrome: Secondary | ICD-10-CM | POA: Diagnosis not present

## 2016-11-05 DIAGNOSIS — M47817 Spondylosis without myelopathy or radiculopathy, lumbosacral region: Secondary | ICD-10-CM | POA: Diagnosis not present

## 2016-11-05 DIAGNOSIS — M545 Low back pain: Secondary | ICD-10-CM | POA: Diagnosis not present

## 2016-11-05 DIAGNOSIS — M5137 Other intervertebral disc degeneration, lumbosacral region: Secondary | ICD-10-CM | POA: Diagnosis not present

## 2017-07-01 DIAGNOSIS — M7661 Achilles tendinitis, right leg: Secondary | ICD-10-CM | POA: Diagnosis not present

## 2017-07-15 DIAGNOSIS — M7661 Achilles tendinitis, right leg: Secondary | ICD-10-CM | POA: Diagnosis not present

## 2017-07-21 DIAGNOSIS — M7661 Achilles tendinitis, right leg: Secondary | ICD-10-CM | POA: Diagnosis not present

## 2017-07-23 DIAGNOSIS — M7661 Achilles tendinitis, right leg: Secondary | ICD-10-CM | POA: Diagnosis not present

## 2017-07-26 DIAGNOSIS — M7661 Achilles tendinitis, right leg: Secondary | ICD-10-CM | POA: Diagnosis not present

## 2017-07-29 DIAGNOSIS — M7661 Achilles tendinitis, right leg: Secondary | ICD-10-CM | POA: Diagnosis not present

## 2017-08-02 DIAGNOSIS — M7661 Achilles tendinitis, right leg: Secondary | ICD-10-CM | POA: Diagnosis not present

## 2017-08-09 DIAGNOSIS — M7661 Achilles tendinitis, right leg: Secondary | ICD-10-CM | POA: Diagnosis not present

## 2017-08-11 DIAGNOSIS — M7661 Achilles tendinitis, right leg: Secondary | ICD-10-CM | POA: Diagnosis not present

## 2017-08-26 DIAGNOSIS — M7661 Achilles tendinitis, right leg: Secondary | ICD-10-CM | POA: Diagnosis not present

## 2017-09-02 DIAGNOSIS — M7661 Achilles tendinitis, right leg: Secondary | ICD-10-CM | POA: Diagnosis not present

## 2017-09-13 DIAGNOSIS — M7661 Achilles tendinitis, right leg: Secondary | ICD-10-CM | POA: Diagnosis not present

## 2017-09-13 DIAGNOSIS — M9261 Juvenile osteochondrosis of tarsus, right ankle: Secondary | ICD-10-CM | POA: Diagnosis not present

## 2017-09-13 DIAGNOSIS — M6701 Short Achilles tendon (acquired), right ankle: Secondary | ICD-10-CM | POA: Diagnosis not present

## 2017-09-14 DIAGNOSIS — C7A8 Other malignant neuroendocrine tumors: Secondary | ICD-10-CM

## 2017-09-14 HISTORY — PX: COLONOSCOPY: SHX174

## 2017-09-14 HISTORY — DX: Other malignant neuroendocrine tumors: C7A.8

## 2017-11-29 DIAGNOSIS — M47816 Spondylosis without myelopathy or radiculopathy, lumbar region: Secondary | ICD-10-CM | POA: Diagnosis not present

## 2017-11-29 DIAGNOSIS — M545 Low back pain: Secondary | ICD-10-CM | POA: Diagnosis not present

## 2017-11-29 DIAGNOSIS — G894 Chronic pain syndrome: Secondary | ICD-10-CM | POA: Diagnosis not present

## 2017-12-03 DIAGNOSIS — M7661 Achilles tendinitis, right leg: Secondary | ICD-10-CM | POA: Diagnosis not present

## 2017-12-03 DIAGNOSIS — M898X7 Other specified disorders of bone, ankle and foot: Secondary | ICD-10-CM | POA: Diagnosis not present

## 2017-12-03 DIAGNOSIS — M6701 Short Achilles tendon (acquired), right ankle: Secondary | ICD-10-CM | POA: Diagnosis not present

## 2017-12-08 DIAGNOSIS — M545 Low back pain: Secondary | ICD-10-CM | POA: Diagnosis not present

## 2017-12-13 DIAGNOSIS — M545 Low back pain: Secondary | ICD-10-CM | POA: Diagnosis not present

## 2017-12-20 ENCOUNTER — Other Ambulatory Visit: Payer: Self-pay | Admitting: Orthopedic Surgery

## 2017-12-27 DIAGNOSIS — M545 Low back pain: Secondary | ICD-10-CM | POA: Diagnosis not present

## 2018-01-05 DIAGNOSIS — M545 Low back pain: Secondary | ICD-10-CM | POA: Diagnosis not present

## 2018-01-24 DIAGNOSIS — M47816 Spondylosis without myelopathy or radiculopathy, lumbar region: Secondary | ICD-10-CM | POA: Diagnosis not present

## 2018-01-24 DIAGNOSIS — M545 Low back pain: Secondary | ICD-10-CM | POA: Diagnosis not present

## 2018-01-24 DIAGNOSIS — G894 Chronic pain syndrome: Secondary | ICD-10-CM | POA: Diagnosis not present

## 2018-01-24 DIAGNOSIS — Z79891 Long term (current) use of opiate analgesic: Secondary | ICD-10-CM | POA: Diagnosis not present

## 2018-01-24 DIAGNOSIS — Z79899 Other long term (current) drug therapy: Secondary | ICD-10-CM | POA: Diagnosis not present

## 2018-01-26 DIAGNOSIS — M47817 Spondylosis without myelopathy or radiculopathy, lumbosacral region: Secondary | ICD-10-CM | POA: Diagnosis not present

## 2018-01-28 ENCOUNTER — Encounter (HOSPITAL_BASED_OUTPATIENT_CLINIC_OR_DEPARTMENT_OTHER): Payer: Self-pay | Admitting: *Deleted

## 2018-01-28 ENCOUNTER — Other Ambulatory Visit: Payer: Self-pay

## 2018-02-03 ENCOUNTER — Encounter (HOSPITAL_BASED_OUTPATIENT_CLINIC_OR_DEPARTMENT_OTHER): Payer: Self-pay | Admitting: *Deleted

## 2018-02-03 ENCOUNTER — Other Ambulatory Visit: Payer: Self-pay

## 2018-02-03 ENCOUNTER — Ambulatory Visit (HOSPITAL_BASED_OUTPATIENT_CLINIC_OR_DEPARTMENT_OTHER): Payer: BLUE CROSS/BLUE SHIELD | Admitting: Certified Registered"

## 2018-02-03 ENCOUNTER — Ambulatory Visit (HOSPITAL_BASED_OUTPATIENT_CLINIC_OR_DEPARTMENT_OTHER)
Admission: RE | Admit: 2018-02-03 | Discharge: 2018-02-03 | Disposition: A | Discharge status: Home / Self Care | Payer: BLUE CROSS/BLUE SHIELD | Source: Ambulatory Visit | Attending: Orthopedic Surgery | Admitting: Orthopedic Surgery

## 2018-02-03 ENCOUNTER — Encounter (HOSPITAL_BASED_OUTPATIENT_CLINIC_OR_DEPARTMENT_OTHER)
Admission: RE | Disposition: A | Discharge status: Home / Self Care | Payer: Self-pay | Source: Ambulatory Visit | Attending: Orthopedic Surgery

## 2018-02-03 DIAGNOSIS — Z79891 Long term (current) use of opiate analgesic: Secondary | ICD-10-CM | POA: Insufficient documentation

## 2018-02-03 DIAGNOSIS — M6700 Short Achilles tendon (acquired), unspecified ankle: Secondary | ICD-10-CM | POA: Diagnosis not present

## 2018-02-03 DIAGNOSIS — G4730 Sleep apnea, unspecified: Secondary | ICD-10-CM | POA: Diagnosis not present

## 2018-02-03 DIAGNOSIS — G8918 Other acute postprocedural pain: Secondary | ICD-10-CM | POA: Diagnosis not present

## 2018-02-03 DIAGNOSIS — M216X1 Other acquired deformities of right foot: Secondary | ICD-10-CM | POA: Diagnosis not present

## 2018-02-03 DIAGNOSIS — M6701 Short Achilles tendon (acquired), right ankle: Secondary | ICD-10-CM | POA: Diagnosis not present

## 2018-02-03 DIAGNOSIS — M7661 Achilles tendinitis, right leg: Secondary | ICD-10-CM | POA: Diagnosis not present

## 2018-02-03 DIAGNOSIS — M7731 Calcaneal spur, right foot: Secondary | ICD-10-CM | POA: Diagnosis not present

## 2018-02-03 HISTORY — PX: EXCISION HAGLUND'S DEFORMITY WITH ACHILLES TENDON REPAIR: SHX5627

## 2018-02-03 HISTORY — DX: Sleep apnea, unspecified: G47.30

## 2018-02-03 HISTORY — PX: GASTROC RECESSION EXTREMITY: SHX6262

## 2018-02-03 SURGERY — EXCISION HAGLUND'S DEFORMITY WITH ACHILLES TENDON REPAIR
Anesthesia: General | Site: Leg Lower | Laterality: Right

## 2018-02-03 MED ORDER — HYDROMORPHONE HCL 1 MG/ML IJ SOLN
0.2500 mg | INTRAMUSCULAR | Status: DC | PRN
Start: 2018-02-03 — End: 2018-02-03

## 2018-02-03 MED ORDER — SUCCINYLCHOLINE CHLORIDE 20 MG/ML IJ SOLN
INTRAMUSCULAR | Status: DC | PRN
  Administered 2018-02-03: 100 mg via INTRAVENOUS

## 2018-02-03 MED ORDER — SCOPOLAMINE 1 MG/3DAYS TD PT72
MEDICATED_PATCH | TRANSDERMAL | Status: AC
  Filled 2018-02-03: qty 1

## 2018-02-03 MED ORDER — CEFAZOLIN SODIUM-DEXTROSE 2-4 GM/100ML-% IV SOLN
INTRAVENOUS | Status: AC
  Filled 2018-02-03: qty 100

## 2018-02-03 MED ORDER — ROPIVACAINE HCL 5 MG/ML IJ SOLN
INTRAMUSCULAR | Status: DC | PRN
  Administered 2018-02-03: 10 mL

## 2018-02-03 MED ORDER — LACTATED RINGERS IV SOLN
INTRAVENOUS | Status: DC
  Administered 2018-02-03 (×2): via INTRAVENOUS

## 2018-02-03 MED ORDER — PROPOFOL 10 MG/ML IV BOLUS
INTRAVENOUS | Status: DC | PRN
  Administered 2018-02-03: 200 mg via INTRAVENOUS
  Administered 2018-02-03: 70 mg via INTRAVENOUS

## 2018-02-03 MED ORDER — FENTANYL CITRATE (PF) 100 MCG/2ML IJ SOLN
INTRAMUSCULAR | Status: AC
  Filled 2018-02-03: qty 2

## 2018-02-03 MED ORDER — FENTANYL CITRATE (PF) 100 MCG/2ML IJ SOLN
50.0000 ug | INTRAMUSCULAR | Status: DC | PRN
  Administered 2018-02-03: 100 ug via INTRAVENOUS

## 2018-02-03 MED ORDER — 0.9 % SODIUM CHLORIDE (POUR BTL) OPTIME
TOPICAL | Status: DC | PRN
  Administered 2018-02-03: 1000 mL

## 2018-02-03 MED ORDER — SENNA 8.6 MG PO TABS: 2.0000 | ORAL_TABLET | Freq: Two times a day (BID) | ORAL | 0 refills | Status: DC

## 2018-02-03 MED ORDER — PROPOFOL 500 MG/50ML IV EMUL
INTRAVENOUS | Status: AC
  Filled 2018-02-03: qty 50

## 2018-02-03 MED ORDER — CEFAZOLIN SODIUM-DEXTROSE 2-4 GM/100ML-% IV SOLN
2.0000 g | INTRAVENOUS | Status: AC
  Administered 2018-02-03: 2 g via INTRAVENOUS

## 2018-02-03 MED ORDER — ASPIRIN EC 81 MG PO TBEC: 81.0000 mg | DELAYED_RELEASE_TABLET | Freq: Two times a day (BID) | ORAL | 0 refills | Status: DC

## 2018-02-03 MED ORDER — SCOPOLAMINE 1 MG/3DAYS TD PT72
1.0000 | MEDICATED_PATCH | Freq: Once | TRANSDERMAL | Status: DC | PRN
  Administered 2018-02-03: 1.5 mg via TRANSDERMAL

## 2018-02-03 MED ORDER — DEXAMETHASONE SODIUM PHOSPHATE 10 MG/ML IJ SOLN
INTRAMUSCULAR | Status: AC
  Filled 2018-02-03: qty 1

## 2018-02-03 MED ORDER — SODIUM CHLORIDE 0.9 % IV SOLN: INTRAVENOUS | Status: DC

## 2018-02-03 MED ORDER — BUPIVACAINE-EPINEPHRINE (PF) 0.5% -1:200000 IJ SOLN
INTRAMUSCULAR | Status: DC | PRN
  Administered 2018-02-03: 30 mL via PERINEURAL

## 2018-02-03 MED ORDER — MIDAZOLAM HCL 2 MG/2ML IJ SOLN
INTRAMUSCULAR | Status: AC
  Filled 2018-02-03: qty 2

## 2018-02-03 MED ORDER — PHENYLEPHRINE 40 MCG/ML (10ML) SYRINGE FOR IV PUSH (FOR BLOOD PRESSURE SUPPORT)
PREFILLED_SYRINGE | INTRAVENOUS | Status: DC | PRN
  Administered 2018-02-03 (×2): 80 ug via INTRAVENOUS

## 2018-02-03 MED ORDER — OXYCODONE HCL 5 MG PO TABS: 5.0000 mg | ORAL_TABLET | ORAL | 0 refills | Status: AC | PRN

## 2018-02-03 MED ORDER — CHLORHEXIDINE GLUCONATE 4 % EX LIQD: 60.0000 mL | Freq: Once | CUTANEOUS | Status: DC

## 2018-02-03 MED ORDER — ONDANSETRON HCL 4 MG/2ML IJ SOLN
INTRAMUSCULAR | Status: AC
  Filled 2018-02-03: qty 2

## 2018-02-03 MED ORDER — LIDOCAINE HCL (CARDIAC) PF 100 MG/5ML IV SOSY
PREFILLED_SYRINGE | INTRAVENOUS | Status: DC | PRN
  Administered 2018-02-03: 40 mg via INTRAVENOUS

## 2018-02-03 MED ORDER — DEXAMETHASONE SODIUM PHOSPHATE 4 MG/ML IJ SOLN
INTRAMUSCULAR | Status: DC | PRN
  Administered 2018-02-03: 10 mg via INTRAVENOUS

## 2018-02-03 MED ORDER — ONDANSETRON HCL 4 MG/2ML IJ SOLN
INTRAMUSCULAR | Status: DC | PRN
  Administered 2018-02-03: 4 mg via INTRAVENOUS

## 2018-02-03 MED ORDER — DOCUSATE SODIUM 100 MG PO CAPS: 100.0000 mg | ORAL_CAPSULE | Freq: Two times a day (BID) | ORAL | 0 refills | Status: DC

## 2018-02-03 MED ORDER — MIDAZOLAM HCL 2 MG/2ML IJ SOLN
1.0000 mg | INTRAMUSCULAR | Status: DC | PRN
  Administered 2018-02-03: 2 mg via INTRAVENOUS

## 2018-02-03 SURGICAL SUPPLY — 80 items
ANCH SUT 5.5 1 ROW 2 STRN (Anchor) ×4 IMPLANT
ANCH SUT KNTLS STRL SHLDR SYS (Anchor) ×4 IMPLANT
ANCH SUT STRL SHLDR SYS ROTR (Anchor) ×4 IMPLANT
ANCHOR SUT GL2 QUATTRO 2.9 (Anchor) ×2 IMPLANT
ANCHOR SUT QUATTRO KNTLS 4.5 (Anchor) ×2 IMPLANT
ANCHOR SUT QUATTRO X2 5.5 (Anchor) ×2 IMPLANT
BANDAGE ESMARK 6X9 LF (GAUZE/BANDAGES/DRESSINGS) ×2 IMPLANT
BLADE AVERAGE 25X9 (BLADE) IMPLANT
BLADE MICRO SAGITTAL (BLADE) ×1 IMPLANT
BLADE SURG 15 STRL LF DISP TIS (BLADE) ×4 IMPLANT
BLADE SURG 15 STRL SS (BLADE) ×9
BNDG CMPR 9X6 STRL LF SNTH (GAUZE/BANDAGES/DRESSINGS) ×2
BNDG COHESIVE 4X5 TAN STRL (GAUZE/BANDAGES/DRESSINGS) ×3 IMPLANT
BNDG COHESIVE 6X5 TAN STRL LF (GAUZE/BANDAGES/DRESSINGS) ×3 IMPLANT
BNDG ESMARK 6X9 LF (GAUZE/BANDAGES/DRESSINGS) ×3
BOOT STEPPER DURA LG (SOFTGOODS) IMPLANT
BOOT STEPPER DURA MED (SOFTGOODS) IMPLANT
CANISTER SUCT 1200ML W/VALVE (MISCELLANEOUS) IMPLANT
CHLORAPREP W/TINT 26ML (MISCELLANEOUS) ×3 IMPLANT
COVER BACK TABLE 60X90IN (DRAPES) ×3 IMPLANT
CUFF TOURNIQUET SINGLE 34IN LL (TOURNIQUET CUFF) ×3 IMPLANT
CUFF TOURNIQUET SINGLE 44IN (TOURNIQUET CUFF) IMPLANT
DRAPE EXTREMITY T 121X128X90 (DRAPE) ×3 IMPLANT
DRAPE OEC MINIVIEW 54X84 (DRAPES) IMPLANT
DRAPE U-SHAPE 47X51 STRL (DRAPES) ×3 IMPLANT
DRSG MEPITEL 4X7.2 (GAUZE/BANDAGES/DRESSINGS) ×3 IMPLANT
DRSG PAD ABDOMINAL 8X10 ST (GAUZE/BANDAGES/DRESSINGS) ×6 IMPLANT
ELECT REM PT RETURN 9FT ADLT (ELECTROSURGICAL) ×3
ELECTRODE REM PT RTRN 9FT ADLT (ELECTROSURGICAL) ×2 IMPLANT
GAUZE SPONGE 4X4 12PLY STRL (GAUZE/BANDAGES/DRESSINGS) ×3 IMPLANT
GLOVE BIO SURGEON STRL SZ8 (GLOVE) ×3 IMPLANT
GLOVE BIOGEL PI IND STRL 7.0 (GLOVE) IMPLANT
GLOVE BIOGEL PI IND STRL 8 (GLOVE) ×4 IMPLANT
GLOVE BIOGEL PI INDICATOR 7.0 (GLOVE) ×2
GLOVE BIOGEL PI INDICATOR 8 (GLOVE) ×2
GLOVE ECLIPSE 6.5 STRL STRAW (GLOVE) ×1 IMPLANT
GLOVE ECLIPSE 8.0 STRL XLNG CF (GLOVE) ×3 IMPLANT
GOWN STRL REUS W/ TWL LRG LVL3 (GOWN DISPOSABLE) ×2 IMPLANT
GOWN STRL REUS W/ TWL XL LVL3 (GOWN DISPOSABLE) ×4 IMPLANT
GOWN STRL REUS W/TWL LRG LVL3 (GOWN DISPOSABLE) ×3
GOWN STRL REUS W/TWL XL LVL3 (GOWN DISPOSABLE) ×6
KIT BIO-TENODESIS 3X8 DISP (MISCELLANEOUS)
KIT INSRT BABSR STRL DISP BTN (MISCELLANEOUS) IMPLANT
NDL SAFETY ECLIPSE 18X1.5 (NEEDLE) IMPLANT
NDL SUT 6 .5 CRC .975X.05 MAYO (NEEDLE) IMPLANT
NEEDLE HYPO 18GX1.5 SHARP (NEEDLE)
NEEDLE HYPO 22GX1.5 SAFETY (NEEDLE) IMPLANT
NEEDLE MAYO TAPER (NEEDLE) ×3
NS IRRIG 1000ML POUR BTL (IV SOLUTION) ×3 IMPLANT
PACK BASIN DAY SURGERY FS (CUSTOM PROCEDURE TRAY) ×3 IMPLANT
PAD CAST 4YDX4 CTTN HI CHSV (CAST SUPPLIES) ×2 IMPLANT
PADDING CAST COTTON 4X4 STRL (CAST SUPPLIES) ×3
PADDING CAST COTTON 6X4 STRL (CAST SUPPLIES) ×3 IMPLANT
PENCIL BUTTON HOLSTER BLD 10FT (ELECTRODE) ×3 IMPLANT
SANITIZER HAND PURELL 535ML FO (MISCELLANEOUS) ×3 IMPLANT
SHEET MEDIUM DRAPE 40X70 STRL (DRAPES) ×3 IMPLANT
SLEEVE SCD COMPRESS KNEE MED (MISCELLANEOUS) ×3 IMPLANT
SPLINT FAST PLASTER 5X30 (CAST SUPPLIES) ×20
SPLINT PLASTER CAST FAST 5X30 (CAST SUPPLIES) ×40 IMPLANT
SPONGE LAP 18X18 RF (DISPOSABLE) ×3 IMPLANT
STAPLER VISISTAT 35W (STAPLE) IMPLANT
STOCKINETTE 6  STRL (DRAPES) ×1
STOCKINETTE 6 STRL (DRAPES) ×2 IMPLANT
SUCTION FRAZIER HANDLE 10FR (MISCELLANEOUS) ×1
SUCTION TUBE FRAZIER 10FR DISP (MISCELLANEOUS) IMPLANT
SUT ETHIBOND 2 OS 4 DA (SUTURE) IMPLANT
SUT ETHIBOND 3-0 V-5 (SUTURE) IMPLANT
SUT ETHILON 3 0 PS 1 (SUTURE) ×3 IMPLANT
SUT FIBERWIRE #2 38 T-5 BLUE (SUTURE)
SUT MNCRL AB 3-0 PS2 18 (SUTURE) ×3 IMPLANT
SUT VIC AB 0 CT1 27 (SUTURE)
SUT VIC AB 0 CT1 27XBRD ANBCTR (SUTURE) IMPLANT
SUT VIC AB 0 SH 27 (SUTURE) IMPLANT
SUT VIC AB 2-0 SH 27 (SUTURE) ×3
SUT VIC AB 2-0 SH 27XBRD (SUTURE) IMPLANT
SUTURE FIBERWR #2 38 T-5 BLUE (SUTURE) IMPLANT
SYR BULB 3OZ (MISCELLANEOUS) ×3 IMPLANT
TOWEL OR 17X24 6PK STRL BLUE (TOWEL DISPOSABLE) ×6 IMPLANT
TUBE CONNECTING 20X1/4 (TUBING) ×3 IMPLANT
UNDERPAD 30X30 (UNDERPADS AND DIAPERS) ×3 IMPLANT

## 2018-02-03 NOTE — Progress Notes (Signed)
Assisted Dr. Edmond Fitzgerald with right, ultrasound guided, popliteal/saphenous block. Side rails up, monitors on throughout procedure. See vital signs in flow sheet. Tolerated Procedure well.  

## 2018-02-03 NOTE — Anesthesia Preprocedure Evaluation (Addendum)
Anesthesia Evaluation  Patient identified by MRN, date of birth, ID band Patient awake    Reviewed: Allergy & Precautions, H&P , NPO status , Patient's Chart, lab work & pertinent test results  Airway Mallampati: III  TM Distance: >3 FB Neck ROM: Full    Dental no notable dental hx. (+) Teeth Intact, Dental Advisory Given   Pulmonary sleep apnea ,    Pulmonary exam normal breath sounds clear to auscultation       Cardiovascular negative cardio ROS   Rhythm:Regular Rate:Normal     Neuro/Psych negative neurological ROS  negative psych ROS   GI/Hepatic Neg liver ROS, GERD  Controlled,  Endo/Other  negative endocrine ROS  Renal/GU negative Renal ROS  negative genitourinary   Musculoskeletal   Abdominal   Peds  Hematology negative hematology ROS (+)   Anesthesia Other Findings   Reproductive/Obstetrics negative OB ROS                            Anesthesia Physical Anesthesia Plan  ASA: III  Anesthesia Plan: General   Post-op Pain Management:  Regional for Post-op pain   Induction: Intravenous  PONV Risk Score and Plan: 2 and Ondansetron, Dexamethasone and Midazolam  Airway Management Planned: LMA and Oral ETT  Additional Equipment:   Intra-op Plan:   Post-operative Plan: Extubation in OR  Informed Consent: I have reviewed the patients History and Physical, chart, labs and discussed the procedure including the risks, benefits and alternatives for the proposed anesthesia with the patient or authorized representative who has indicated his/her understanding and acceptance.   Dental advisory given  Plan Discussed with: CRNA  Anesthesia Plan Comments:         Anesthesia Quick Evaluation

## 2018-02-03 NOTE — Op Note (Signed)
02/03/2018  1:28 PM  PATIENT:  Henry Lang  57 y.o. male  PRE-OPERATIVE DIAGNOSIS: 1.  Right Achilles insertional tendinitis 2.  Right Haglund deformity 3.  Tight right heel cord  POST-OPERATIVE DIAGNOSIS: Same  Procedure(s): 1.  Right gastrocnemius recession through a separate incision 2.  Excision of right Haglund deformity 3.  Right Achilles tendon debridement and reconstruction  SURGEON:  Wylene Simmer, MD  ASSISTANT: Mechele Claude, PA-C  ANESTHESIA:   General, regional  EBL:  minimal   TOURNIQUET:  < 1 hour at 109 mm Hg  COMPLICATIONS:  None apparent  DISPOSITION:  Extubated, awake and stable to recovery.  INDICATION FOR PROCEDURE: The patient is a 57 year old male without significant past medical history.  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  He also has a tight right heel cord.  He has failed nonoperative treatment to date including activity modification, oral anti-inflammatories, physical therapy and shoewear modification.  He presents now for operative treatment of this painful and limiting condition.  The risks and benefits of the alternative treatment options have been discussed in detail.  The patient wishes to proceed with surgery and specifically understands risks of bleeding, infection, nerve damage, blood clots, need for additional surgery, amputation and death.  PROCEDURE IN DETAIL:  After pre operative consent was obtained, and the correct operative site was identified, the patient was brought to the operating room supine on the stretcher.  Preoperative antibiotics were administered.  Surgical timeout was taken.  General anesthesia was administered.  The right lower extremity was exsanguinated and a thigh tourniquet inflated to 250 mmHg.  The patient was then turned into the prone position on the operating table with all bony prominences padded well.  The right lower extremity was then prepped and draped in  standard sterile fashion.  A longitudinal incision was made at the calf.  Dissection was carried down to the subtenons tissues taking care to protect the sural nerve.  Gastric anemias tendon was identified.  It was divided under direct vision along with the plantaris tendon.  The wound was irrigated and closed with Monocryl and nylon.  Attention was then turned to the posterior heel where a longitudinal incision was made in the midline.  Dissection was carried down through the skin subtenons tissue and peritenon creating full-thickness flaps medially and laterally.  The Achilles tendon was then split longitudinally and released from its insertion on the calcaneus medially and laterally.  The tendon was debrided of all degenerated or diseased tissue.  An oscillating saw was then used to resect the Haglund deformity.  The Achilles tendon was repaired back to the cut surface of bone using Sanford suture anchors and an hourglass pattern of nonabsorbable suture.  The knee was then flexed and the repair was noted to be stable with no gapping at maximal dorsiflexion of the ankle.  The wound was irrigated copiously.  Deep subtenons tissues were approximated with 2-0 Vicryl.  The split in the tendon was repaired with 2-0 Vicryl as well.  Subtenons tissues were approximated with Monocryl.  Skin incision was closed with 3-0 nylon.  Sterile dressings were applied followed by a well-padded short leg splint.  The tourniquet was released after application of the dressings at less than 1 hour.  The patient was awakened from anesthesia and transported to the recovery room in stable condition.  FOLLOW UP PLAN: The patient be nonweightbearing on the right lower extremity in a short  leg splint.  Aspirin 80,000,000 mg p.o. twice daily for DVT prophylaxis.  Follow-up with me in the office in 2 weeks for suture removal and conversion to a cam boot with 2 heel lifts.   Mechele Claude PA-C was present and scrubbed for the  duration of the operative case. His assistance was essential in positioning the patient, prepping and draping, gaining and maintaining exposure, performing the operation, closing and dressing the wounds and applying the splint.

## 2018-02-03 NOTE — Anesthesia Procedure Notes (Signed)
Procedure Name: Intubation Date/Time: 02/03/2018 12:22 PM Performed by: Maryella Shivers, CRNA Pre-anesthesia Checklist: Patient identified, Emergency Drugs available, Suction available and Patient being monitored Patient Re-evaluated:Patient Re-evaluated prior to induction Oxygen Delivery Method: Circle system utilized Preoxygenation: Pre-oxygenation with 100% oxygen Induction Type: IV induction Ventilation: Mask ventilation without difficulty Laryngoscope Size: Glidescope Grade View: Grade I Tube type: Oral Number of attempts: 1 Airway Equipment and Method: Stylet and Oral airway Placement Confirmation: ETT inserted through vocal cords under direct vision,  positive ETCO2 and breath sounds checked- equal and bilateral Secured at: 24 cm Tube secured with: Tape Dental Injury: Teeth and Oropharynx as per pre-operative assessment

## 2018-02-03 NOTE — Transfer of Care (Signed)
Immediate Anesthesia Transfer of Care Note  Patient: Henry Lang  Procedure(s) Performed: Right Haglund excision, Achilles tendon reconstruction (Right Foot) GASTROC RECESSION EXTREMITY (Right Leg Lower)  Patient Location: PACU  Anesthesia Type:General  Level of Consciousness: sedated  Airway & Oxygen Therapy: Patient Spontanous Breathing and Patient connected to face mask oxygen  Post-op Assessment: Report given to RN and Post -op Vital signs reviewed and stable  Post vital signs: Reviewed and stable  Last Vitals:  Vitals Value Taken Time  BP 127/75 02/03/2018  1:35 PM  Temp    Pulse 97 02/03/2018  1:36 PM  Resp 20 02/03/2018  1:36 PM  SpO2 100 % 02/03/2018  1:36 PM  Vitals shown include unvalidated device data.  Last Pain:  Vitals:   02/03/18 1029  TempSrc: Oral  PainSc: 2       Patients Stated Pain Goal: 2 (81/84/03 7543)  Complications: No apparent anesthesia complications

## 2018-02-03 NOTE — H&P (Signed)
Henry Lang is an 57 y.o. male.   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  He has a tight heel cord as well.  He has failed nonoperative treatment to date including activity modification, oral anti-inflammatories, physical therapy and shoewear modification.  He presents today for operative treatment of this painful and limiting condition.  Past Medical History:  Diagnosis Date  . Chest pain, unspecified   . Esophageal reflux   . Lumbago   . Nonspecific abnormal electrocardiogram (ECG) (EKG)   . Nonspecific abnormal results of liver function study   . Other abnormal glucose   . Sleep apnea    hx .Marland Kitchen..doesnt wear mask    Past Surgical History:  Procedure Laterality Date  . PALATE / UVULA BIOPSY / EXCISION  2013  . VASECTOMY  1986  . VASECTOMY REVERSAL      Family History  Problem Relation Age of Onset  . Colon cancer Mother   . Colon cancer Father   . Diabetes Other   . Hypertension Other   . Cancer Other        Breast Cancer  . Colon cancer Paternal Uncle   . Early death Neg Hx   . Heart disease Neg Hx   . Hyperlipidemia Neg Hx   . Kidney disease Neg Hx   . Stroke Neg Hx   . Esophageal cancer Neg Hx   . Rectal cancer Neg Hx   . Stomach cancer Neg Hx    Social History:  reports that he has never smoked. He has never used smokeless tobacco. He reports that he drinks alcohol. He reports that he does not use drugs.  Allergies: No Known Allergies  Medications Prior to Admission  Medication Sig Dispense Refill  . HYDROcodone-acetaminophen (NORCO/VICODIN) 5-325 MG tablet Take 1 tablet by mouth every 6 (six) hours as needed for moderate pain. 90 tablet 0    No results found for this or any previous visit (from the past 48 hour(s)). No results found.  ROS no recent fever, chills, nausea, vomiting or changes in his appetite  Blood pressure (!) 93/52, pulse  81, temperature (!) 97.5 F (36.4 C), temperature source Oral, resp. rate 17, height 5\' 11"  (1.803 m), weight 112.9 kg (249 lb), SpO2 100 %. Physical Exam  Well-nourished well-developed man in no apparent distress.  Alert and oriented x4.  Mood and affect are normal.  Extraocular motions are intact.  Respirations are unlabored.  Gait is antalgic to the right.  The right heel is tender to palpation at the insertion of the Achilles.  Skin is healthy and intact.  No lymphadenopathy.  Pulses are palpable.  5 out of 5 strength in plantar flexion and dorsiflexion of the ankle.  Heel cord is tight.  Assessment/Plan Right Achilles insertional tendinopathy, Haglund deformity and tight heel cord -to the operating room today for gastrocnemius recession, Achilles tendon debridement and reconstruction as well as excision of the Haglund deformity. The risks and benefits of the alternative treatment options have been discussed in detail.  The patient wishes to proceed with surgery and specifically understands risks of bleeding, infection, nerve damage, blood clots, need for additional surgery, amputation and death.  Wylene Simmer, MD 04-Mar-2018, 11:50 AM

## 2018-02-03 NOTE — Anesthesia Procedure Notes (Signed)
Anesthesia Regional Block: Popliteal block   Pre-Anesthetic Checklist: ,, timeout performed, Correct Patient, Correct Site, Correct Laterality, Correct Procedure, Correct Position, site marked, Risks and benefits discussed, pre-op evaluation,  At surgeon's request and post-op pain management  Laterality: Right  Prep: Maximum Sterile Barrier Precautions used, chloraprep       Needles:  Injection technique: Single-shot  Needle Type: Echogenic Stimulator Needle     Needle Length: 9cm  Needle Gauge: 21     Additional Needles:   Procedures:,,,, ultrasound used (permanent image in chart),,,,  Narrative:  Start time: 02/03/2018 10:58 AM End time: 02/03/2018 11:08 AM Injection made incrementally with aspirations every 5 mL. Anesthesiologist: Roderic Palau, MD  Additional Notes: 2% Lidocaine skin wheel. Saphenous block with 10cc of 0.5% Ropivicaine plain.

## 2018-02-03 NOTE — Anesthesia Postprocedure Evaluation (Signed)
Anesthesia Post Note  Patient: DVAUGHN FICKLE  Procedure(s) Performed: Right Haglund excision, Achilles tendon reconstruction (Right Foot) GASTROC RECESSION EXTREMITY (Right Leg Lower)     Patient location during evaluation: PACU Anesthesia Type: General Level of consciousness: awake and alert and oriented Pain management: pain level controlled Vital Signs Assessment: post-procedure vital signs reviewed and stable Respiratory status: spontaneous breathing, nonlabored ventilation and respiratory function stable Cardiovascular status: blood pressure returned to baseline and stable Postop Assessment: no apparent nausea or vomiting Anesthetic complications: no    Last Vitals:  Vitals:   02/03/18 1345 02/03/18 1400  BP: 117/69 (!) 133/92  Pulse: 90 89  Resp: 18 18  Temp:    SpO2: 100% 100%    Last Pain:  Vitals:   02/03/18 1400  TempSrc:   PainSc: 0-No pain                 Indianna Boran A.

## 2018-02-03 NOTE — Discharge Instructions (Signed)
Henry Simmer, MD [place]  Please read the following information regarding your care after surgery.  Medications  You only need a prescription for the narcotic pain medicine (ex. oxycodone, Percocet, Norco).  All of the other medicines listed below are available over the counter. X Aleve 2 pills twice a day for the first 3 days after surgery. X acetominophen (Tylenol) 650 mg every 4-6 hours as you need for minor to moderate pain X oxycodone as prescribed for severe pain  Narcotic pain medicine (ex. oxycodone, Percocet, Vicodin) will cause constipation.  To prevent this problem, take the following medicines while you are taking any pain medicine. X docusate sodium (Colace) 100 mg twice a day X senna (Senokot) 2 tablets twice a day  X To help prevent blood clots, take a baby aspirin (81 mg) twice a day for two weeks after surgery.  You should also get up every hour while you are awake to move around.    Weight Bearing X Do not bear any weight on the operated leg or foot.  Cast / Splint / Dressing X Keep your splint, cast or dressing clean and dry.  Dont put anything (coat hanger, pencil, etc) down inside of it.  If it gets damp, use a hair dryer on the cool setting to dry it.  If it gets soaked, call the office to schedule an appointment for a cast change.    After your dressing, cast or splint is removed; you may shower, but do not soak or scrub the wound.  Allow the water to run over it, and then gently pat it dry.  Swelling It is normal for you to have swelling where you had surgery.  To reduce swelling and pain, keep your toes above your nose for at least 3 days after surgery.  It may be necessary to keep your foot or leg elevated for several weeks.  If it hurts, it should be elevated.  Follow Up Call my office at [phone] when you are discharged from the hospital or surgery center to schedule an appointment to be seen two weeks after surgery.  Call my office at  [phone] if you develop a fever >101.5 F, nausea, vomiting, bleeding from the surgical site or severe pain.    Regional Anesthesia Blocks  1. Numbness or the inability to move the "blocked" extremity may last from 3-48 hours after placement. The length of time depends on the medication injected and your individual response to the medication. If the numbness is not going away after 48 hours, call your surgeon.  2. The extremity that is blocked will need to be protected until the numbness is gone and the  Strength has returned. Because you cannot feel it, you will need to take extra care to avoid injury. Because it may be weak, you may have difficulty moving it or using it. You may not know what position it is in without looking at it while the block is in effect.  3. For blocks in the legs and feet, returning to weight bearing and walking needs to be done carefully. You will need to wait until the numbness is entirely gone and the strength has returned. You should be able to move your leg and foot normally before you try and bear weight or walk. You will need someone to be with you when you first try to ensure you do not fall and possibly risk injury.  4. Bruising and tenderness at the needle site are common side effects and  will resolve in a few days.  5. Persistent numbness or new problems with movement should be communicated to the surgeon or the [facility] [phone] [place] [phone]).   Post Anesthesia Home Care Instructions  Activity: Get plenty of rest for the remainder of the day. A responsible individual must stay with you for 24 hours following the procedure.  For the next 24 hours, DO NOT: -Drive a car -Paediatric nurse -Drink alcoholic beverages -Take any medication unless instructed by your physician -Make any legal decisions or sign important papers.  Meals: Start with liquid foods such as gelatin or soup. Progress to regular foods  as tolerated. Avoid greasy, spicy, heavy foods. If nausea and/or vomiting occur, drink only clear liquids until the nausea and/or vomiting subsides. Call your physician if vomiting continues.  Special Instructions/Symptoms: Your throat may feel dry or sore from the anesthesia or the breathing tube placed in your throat during surgery. If this causes discomfort, gargle with warm salt water. The discomfort should disappear within 24 hours.  If you had a scopolamine patch placed behind your ear for the management of post- operative nausea and/or vomiting:  1. The medication in the patch is effective for 72 hours, after which it should be removed.  Wrap patch in a tissue and discard in the trash. Wash hands thoroughly with soap and water. 2. You may remove the patch earlier than 72 hours if you experience unpleasant side effects which may include dry mouth, dizziness or visual disturbances. 3. Avoid touching the patch. Wash your hands with soap and water after contact with the patch.

## 2018-02-04 ENCOUNTER — Encounter (HOSPITAL_BASED_OUTPATIENT_CLINIC_OR_DEPARTMENT_OTHER): Payer: Self-pay | Admitting: Orthopedic Surgery

## 2018-02-16 DIAGNOSIS — M6701 Short Achilles tendon (acquired), right ankle: Secondary | ICD-10-CM | POA: Diagnosis not present

## 2018-03-19 DIAGNOSIS — R21 Rash and other nonspecific skin eruption: Secondary | ICD-10-CM | POA: Diagnosis not present

## 2018-04-04 DIAGNOSIS — M25571 Pain in right ankle and joints of right foot: Secondary | ICD-10-CM | POA: Diagnosis not present

## 2018-04-11 DIAGNOSIS — M25571 Pain in right ankle and joints of right foot: Secondary | ICD-10-CM | POA: Diagnosis not present

## 2018-04-19 DIAGNOSIS — M25571 Pain in right ankle and joints of right foot: Secondary | ICD-10-CM | POA: Diagnosis not present

## 2018-04-25 DIAGNOSIS — M25571 Pain in right ankle and joints of right foot: Secondary | ICD-10-CM | POA: Diagnosis not present

## 2018-05-02 DIAGNOSIS — M25571 Pain in right ankle and joints of right foot: Secondary | ICD-10-CM | POA: Diagnosis not present

## 2018-05-10 DIAGNOSIS — M25571 Pain in right ankle and joints of right foot: Secondary | ICD-10-CM | POA: Diagnosis not present

## 2018-05-19 DIAGNOSIS — M25571 Pain in right ankle and joints of right foot: Secondary | ICD-10-CM | POA: Diagnosis not present

## 2018-05-26 DIAGNOSIS — M25571 Pain in right ankle and joints of right foot: Secondary | ICD-10-CM | POA: Diagnosis not present

## 2018-06-01 DIAGNOSIS — M25571 Pain in right ankle and joints of right foot: Secondary | ICD-10-CM | POA: Diagnosis not present

## 2018-06-06 ENCOUNTER — Encounter: Payer: Self-pay | Admitting: Family

## 2018-06-06 ENCOUNTER — Ambulatory Visit: Payer: BLUE CROSS/BLUE SHIELD | Admitting: Family

## 2018-06-06 ENCOUNTER — Other Ambulatory Visit (INDEPENDENT_AMBULATORY_CARE_PROVIDER_SITE_OTHER): Payer: BLUE CROSS/BLUE SHIELD

## 2018-06-06 VITALS — BP 130/82 | HR 83 | Temp 97.9°F | Ht 71.0 in | Wt 252.0 lb

## 2018-06-06 DIAGNOSIS — R1010 Upper abdominal pain, unspecified: Secondary | ICD-10-CM | POA: Diagnosis not present

## 2018-06-06 LAB — COMPREHENSIVE METABOLIC PANEL
ALT: 22 U/L (ref 0–53)
AST: 13 U/L (ref 0–37)
Albumin: 4.3 g/dL (ref 3.5–5.2)
Alkaline Phosphatase: 55 U/L (ref 39–117)
BILIRUBIN TOTAL: 0.5 mg/dL (ref 0.2–1.2)
BUN: 13 mg/dL (ref 6–23)
CALCIUM: 9.7 mg/dL (ref 8.4–10.5)
CO2: 24 meq/L (ref 19–32)
CREATININE: 0.81 mg/dL (ref 0.40–1.50)
Chloride: 107 mEq/L (ref 96–112)
GFR: 104.85 mL/min (ref >60.00)
GLUCOSE: 103 mg/dL — AB (ref 70–99)
Potassium: 3.9 mEq/L (ref 3.5–5.1)
Sodium: 139 mEq/L (ref 135–145)
Total Protein: 7.2 g/dL (ref 6.0–8.3)

## 2018-06-06 LAB — CBC WITH DIFFERENTIAL/PLATELET
BASOS ABS: 0 10*3/uL (ref 0.0–0.1)
Basophils Relative: 0.5 % (ref 0.0–3.0)
EOS ABS: 0.1 10*3/uL (ref 0.0–0.7)
Eosinophils Relative: 1 % (ref 0.0–5.0)
HCT: 39.8 % (ref 39.0–52.0)
Hemoglobin: 13.8 g/dL (ref 13.0–17.0)
LYMPHS ABS: 1.8 10*3/uL (ref 0.7–4.0)
LYMPHS PCT: 26.7 % (ref 12.0–46.0)
MCHC: 34.8 g/dL (ref 30.0–36.0)
MCV: 88.5 fl (ref 78.0–100.0)
Monocytes Absolute: 0.6 10*3/uL (ref 0.1–1.0)
Monocytes Relative: 8.5 % (ref 3.0–12.0)
NEUTROS ABS: 4.3 10*3/uL (ref 1.4–7.7)
NEUTROS PCT: 63.3 % (ref 43.0–77.0)
PLATELETS: 217 10*3/uL (ref 150.0–400.0)
RBC: 4.49 Mil/uL (ref 4.22–5.81)
RDW: 14.4 % (ref 11.5–15.5)
WBC: 6.8 10*3/uL (ref 4.0–10.5)

## 2018-06-06 LAB — AMYLASE: Amylase: 39 U/L (ref 27–131)

## 2018-06-06 LAB — LIPASE: LIPASE: 25 U/L (ref 11.0–59.0)

## 2018-06-06 MED ORDER — PANTOPRAZOLE SODIUM 40 MG PO TBEC: 40.0000 mg | DELAYED_RELEASE_TABLET | Freq: Two times a day (BID) | ORAL | 0 refills | Status: DC

## 2018-06-06 NOTE — Progress Notes (Signed)
Henry Lang is a 57 y.o. male with the following history as recorded in Epic[employer]:  Patient Active Problem List   Diagnosis Date Noted  . Prediabetes 08/26/2016  . OSA (obstructive sleep apnea) 08/04/2016  . Routine general medical examination at a health care facility 10/21/2012  . GERD 10/25/2009  . Spinal stenosis, lumbar region, without neurogenic claudication 02/16/2008    Current Outpatient Medications  Medication Sig Dispense Refill  . pantoprazole (PROTONIX) 40 MG tablet Take 1 tablet (40 mg total) by mouth 2 (two) times daily. 60 tablet 0   No current facility-administered medications for this visit.     Allergies: Motrin [ibuprofen]  Past Medical History:  Diagnosis Date  . Chest pain, unspecified   . Esophageal reflux   . Lumbago   . Nonspecific abnormal electrocardiogram (ECG) (EKG)   . Nonspecific abnormal results of liver function study   . Other abnormal glucose   . Sleep apnea    hx .Marland Kitchen..doesnt wear mask    Past Surgical History:  Procedure Laterality Date  . EXCISION HAGLUND'S DEFORMITY WITH ACHILLES TENDON REPAIR Right 02/03/2018   Procedure: Right Haglund excision, Achilles tendon reconstruction;  Surgeon: Wylene Simmer, MD;  Location: [place] [facility];  Service: Orthopedics;  Laterality: Right;  . GASTROC RECESSION EXTREMITY Right 02/03/2018   Procedure: GASTROC RECESSION EXTREMITY;  Surgeon: Wylene Simmer, MD;  Location: [facility];  Service: Orthopedics;  Laterality: Right;  . PALATE / UVULA BIOPSY / EXCISION  2013  . VASECTOMY  1986  . VASECTOMY REVERSAL      Family History  Problem Relation Age of Onset  . Colon cancer Mother   . Colon cancer Father   . Diabetes Other   . Hypertension Other   . Cancer Other        Breast Cancer  . Colon cancer Paternal Uncle   . Early death Neg Hx   . Heart disease Neg Hx   . Hyperlipidemia Neg Hx   . Kidney disease Neg Hx   . Stroke Neg Hx   . Esophageal cancer Neg Hx   . Rectal cancer  Neg Hx   . Stomach cancer Neg Hx     Social History   Tobacco Use  . Smoking status: Never Smoker  . Smokeless tobacco: Never Used  Substance Use Topics  . Alcohol use: Yes    Comment: rare    Subjective:  Patient presents with concerns for "random episodes" of abdominal pain; symptoms started about 2-3 months ago; wife accompanies him today; notes the pain will wake him up at night and can last about for about 5-10 minutes; has not been able to determine any triggers; admits that is taking 800 mg of Advil twice a day- has been having to take this amount of Advil for a number of months; PMH documents GERD but patient denies any history of GERD; Denies any changes in weight or bowel movements; denies any blood in stool/ no coffee grounds emesis;   Objective:  Vitals:   06/06/18 0933  BP: 130/82  Pulse: 83  Temp: 97.9 F (36.6 C)  TempSrc: Oral  SpO2: 96%  Weight: 252 lb (114.3 kg)  Height: _0  (1.803 m)    General: Well developed, well nourished, in no acute distress  Skin : Warm and dry.  Head: Normocephalic and atraumatic  Lungs: Respirations unlabored; clear to auscultation bilaterally without wheeze, rales, rhonchi  CVS exam: normal rate and regular rhythm.  Abdomen: Soft; nontender; nondistended; normoactive  bowel sounds; no masses or hepatosplenomegaly  Musculoskeletal: No deformities; no active joint inflammation  Neurologic: Alert and oriented; speech intact; face symmetrical; moves all extremities well; CNII-XII intact without focal deficit  Assessment:  1. Pain of upper abdomen     Plan:  ? Gastritis related to increased use of NSAIDs; Rx for Protonix 40 mg bid; labs updated and abdominal ultrasound updated; refer to GI- ? Need for endoscopy; encouraged to discuss foot/ back pain with those specialists to try and limit amount of Advil he is having to take at this time; follow-up to be determined.  Encouraged to schedule for yearly CPE- he declines at this time.    No follow-ups on file.  Orders Placed This Encounter  Procedures  . US Abdomen Complete    Standing Status:   Future    Standing Expiration Date:   08/07/2019    Order Specific Question:   Reason for Exam (SYMPTOM  OR DIAGNOSIS REQUIRED)    Answer:   abdominal pain    Order Specific Question:   Preferred imaging location?    Answer:   [facility]  . CBC w/Diff    Standing Status:   Future    Standing Expiration Date:   06/06/2019  . Comp Met (CMET)    Standing Status:   Future    Standing Expiration Date:   06/06/2019  . Amylase    Standing Status:   Future    Standing Expiration Date:   06/06/2019  . Lipase    Standing Status:   Future    Standing Expiration Date:   06/06/2019  . Ambulatory referral to Gastroenterology    Referral Priority:   Routine    Referral Type:   Consultation    Referral Reason:   Specialty Services Required    Referred to Provider:   Jerene Bears, MD    Number of Visits Requested:   1    Requested Prescriptions   Signed Prescriptions Disp Refills  . pantoprazole (PROTONIX) 40 MG tablet 60 tablet 0    Sig: Take 1 tablet (40 mg total) by mouth 2 (two) times daily.

## 2018-06-06 NOTE — Patient Instructions (Signed)
Gastritis, Adult Gastritis is inflammation of the stomach. There are two kinds of gastritis:  Acute gastritis. This kind develops suddenly.  Chronic gastritis. This kind lasts for a long time.  Gastritis happens when the lining of the stomach becomes weak or gets damaged. Without treatment, gastritis can lead to stomach bleeding and ulcers. What are the causes? This condition may be caused by:  An infection.  Drinking too much alcohol.  Certain medicines.  Having too much acid in the stomach.  A disease of the intestines or stomach.  Stress.  What are the signs or symptoms? Symptoms of this condition include:  Pain or a burning in the upper abdomen.  Nausea.  Vomiting.  An uncomfortable feeling of fullness after eating.  In some cases, there are no symptoms. How is this diagnosed? This condition may be diagnosed with:  A description of your symptoms.  A physical exam.  Tests. These can include: ? Blood tests. ? Stool tests. ? A test in which a thin, flexible instrument with a light and camera on the end is passed down the esophagus and into the stomach (upper endoscopy). ? A test in which a sample of tissue is taken for testing (biopsy).  How is this treated? This condition may be treated with medicines. If the condition is caused by a bacterial infection, you may be given antibiotic medicines. If it is caused by too much acid in the stomach, you may get medicines called H2 blockers, proton pump inhibitors, or antacids. Treatment may also involve stopping the use of certain medicines, such as aspirin, ibuprofen, or other nonsteroidal anti-inflammatory drugs (NSAIDs). Follow these instructions at home:  Take over-the-counter and prescription medicines only as told by your health care provider.  If you were prescribed an antibiotic, take it as told by your health care provider. Do not stop taking the antibiotic even if you start to feel better.  Drink enough  fluid to keep your urine clear or pale yellow.  Eat small, frequent meals instead of large meals. Contact a health care provider if:  Your symptoms get worse.  Your symptoms return after treatment. Get help right away if:  You vomit blood or material that looks like coffee grounds.  You have black or dark red stools.  You are unable to keep fluids down.  Your abdominal pain gets worse.  You have a fever.  You do not feel better after 1 week. This information is not intended to replace advice given to you by your health care provider. Make sure you discuss any questions you have with your health care provider. Document Released: 08/25/2001 Document Revised: 04/29/2016 Document Reviewed: 05/25/2015 Elsevier Interactive Patient Education  2018 [employer]  

## 2018-06-07 DIAGNOSIS — M79671 Pain in right foot: Secondary | ICD-10-CM | POA: Diagnosis not present

## 2018-06-16 ENCOUNTER — Ambulatory Visit
Admission: RE | Admit: 2018-06-16 | Discharge: 2018-06-16 | Disposition: A | Discharge status: Home / Self Care | Payer: BLUE CROSS/BLUE SHIELD | Source: Ambulatory Visit | Attending: Family | Admitting: Family

## 2018-06-16 DIAGNOSIS — K760 Fatty (change of) liver, not elsewhere classified: Secondary | ICD-10-CM | POA: Diagnosis not present

## 2018-06-16 DIAGNOSIS — R1010 Upper abdominal pain, unspecified: Secondary | ICD-10-CM

## 2018-06-17 ENCOUNTER — Other Ambulatory Visit: Payer: Self-pay | Admitting: Family

## 2018-06-17 DIAGNOSIS — K769 Liver disease, unspecified: Secondary | ICD-10-CM

## 2018-06-17 NOTE — Progress Notes (Signed)
lw 

## 2018-07-04 ENCOUNTER — Other Ambulatory Visit: Payer: Self-pay | Admitting: Family

## 2018-07-05 ENCOUNTER — Ambulatory Visit
Admission: RE | Admit: 2018-07-05 | Discharge: 2018-07-05 | Disposition: A | Discharge status: Home / Self Care | Payer: BLUE CROSS/BLUE SHIELD | Source: Ambulatory Visit | Attending: Family | Admitting: Family

## 2018-07-05 DIAGNOSIS — K760 Fatty (change of) liver, not elsewhere classified: Secondary | ICD-10-CM | POA: Diagnosis not present

## 2018-07-05 DIAGNOSIS — K769 Liver disease, unspecified: Secondary | ICD-10-CM

## 2018-07-05 MED ORDER — GADOBENATE DIMEGLUMINE 529 MG/ML IV SOLN
20.0000 mL | Freq: Once | INTRAVENOUS | Status: AC | PRN
  Administered 2018-07-05: 20 mL via INTRAVENOUS

## 2018-07-06 ENCOUNTER — Other Ambulatory Visit: Payer: Self-pay | Admitting: Family

## 2018-07-06 ENCOUNTER — Telehealth: Payer: Self-pay | Admitting: Internal Medicine

## 2018-07-06 DIAGNOSIS — K769 Liver disease, unspecified: Secondary | ICD-10-CM

## 2018-07-06 NOTE — Telephone Encounter (Signed)
Per [facility] radiology MRI of abdomin results are ready.

## 2018-07-06 NOTE — Progress Notes (Signed)
Discussed abnormal MRI results with patient; he understands need for further testing- will get PET-CT ordered; is already scheduled for GI consult on 11/19- may need to change that appointment based on PET scan results. Follow-up to be determined.

## 2018-07-07 ENCOUNTER — Telehealth: Payer: Self-pay | Admitting: Internal Medicine

## 2018-07-07 NOTE — Telephone Encounter (Signed)
Copied from [place] [id number]. Topic: General - Other >> Jul 07, 2018  9:07 AM Lennox Solders wrote: Reason for CRM: Henry Lang per-service dept at  is calling and pet scan needs to have  prior authorization from insurance. Pt is sch for pet scan on 07/12/18

## 2018-07-07 NOTE — Telephone Encounter (Signed)
Scan is in clinical review - provider will do a peer to peer

## 2018-07-08 ENCOUNTER — Other Ambulatory Visit: Payer: Self-pay | Admitting: Family

## 2018-07-12 ENCOUNTER — Ambulatory Visit (HOSPITAL_COMMUNITY): Payer: BLUE CROSS/BLUE SHIELD

## 2018-07-13 ENCOUNTER — Encounter: Payer: Self-pay | Admitting: *Deleted

## 2018-07-13 ENCOUNTER — Ambulatory Visit: Payer: BLUE CROSS/BLUE SHIELD | Admitting: Internal Medicine

## 2018-07-13 VITALS — BP 114/76 | HR 68 | Ht 70.0 in | Wt 252.0 lb

## 2018-07-13 DIAGNOSIS — R932 Abnormal findings on diagnostic imaging of liver and biliary tract: Secondary | ICD-10-CM | POA: Diagnosis not present

## 2018-07-13 DIAGNOSIS — R1013 Epigastric pain: Secondary | ICD-10-CM | POA: Diagnosis not present

## 2018-07-13 DIAGNOSIS — Z800 Family history of malignant neoplasm of digestive organs: Secondary | ICD-10-CM

## 2018-07-13 NOTE — Patient Instructions (Signed)
You have been scheduled for an endoscopy and colonoscopy. Please follow the written instructions given to you at your visit today. Please pick up your prep supplies at the pharmacy within the next 1-3 days. If you use inhalers (even only as needed), please bring them with you on the day of your procedure. Your physician has requested that you go to www.startemmi.com and enter the access code given to you at your visit today. This web site gives a general overview about your procedure. However, you should still follow specific instructions given to you by our office regarding your preparation for the procedure.  If you are age 16 or older, your body mass index should be between 23-30. Your Body mass index is 36.16 kg/m. If this is out of the aforementioned range listed, please consider follow up with your Primary Care Provider.  If you are age 75 or younger, your body mass index should be between 19-25. Your Body mass index is 36.16 kg/m. If this is out of the aformentioned range listed, please consider follow up with your Primary Care Provider.

## 2018-07-13 NOTE — Progress Notes (Signed)
Patient ID: Henry Lang, male   DOB: [birth year], 57 y.o.   MRN: [id number] HPI: Henry Lang is a 57 year old male with a past medical history of adenomatous colon polyp and sessile serrated colon polyp, family history of colon cancer, sleep apnea, fatty liver who is seen in consult at the request of Jodi Mourning, FNP to evaluate abnormal liver imaging and epigastric pain.  He is here today with his wife.  The patient reports that he was seen by primary care to evaluate epigastric abdominal pain and occasional left upper quadrant pain.  He had been using Advil on a regular basis and there was presumption that he may have had gastritis or mild peptic ulcer disease.  He was started on pantoprazole 40 mg twice daily which he has taken for the past month.  He was having a dull epigastric pain.  He reports that when this occurred about 90% of the time it was at night and waking him from sleep.  He states this was mild to moderate and lasted less than an hour.  Not really associated with diet.  Rarely occurring in the daytime.  No heartburn.  He denies dysphagia and odynophagia.  Denies nausea vomiting.  Denies change in bowel habit.  Denies rectal bleeding and melena.  Reports bowel movements are very regular for him.  With the pantoprazole the epigastric pain has improved tremendously.  He describes a separate left upper quadrant type pain which feels like a spasm and makes it hard for him to breathe.  He is noticed this most when he bends over to pick something up.  This occurs very intermittently.  He had an ultrasound initially to evaluate his symptoms which was performed on 06/16/2018.  This showed fatty liver and a small benign-appearing cyst but also a 17 mm indeterminate lesion in the right lobe.  MRI was recommended.  The ultrasound was otherwise unremarkable.  The gallbladder looked normal.  This led to an MRI performed 1 week ago which showed multiple hypervascular masses showing peripheral rim  enhancement in both the right and left hepatic lobe suspicious for liver metastasis with differential including multifocal hepatocellular carcinoma.  PET scan was recommended.  No other sites of malignancy were identified within the abdomen.  There was fatty liver seen.  PET scan was ordered by primary care but insurance denied the scan.  It was supposed to be yesterday.  Both he and his wife were told that he had liver cyst that needed further characterization but they had not been told about a concern for malignancy.  This information is shocking to them today.  Both of his parents had colon cancer in their 44s to 44s.  His mother's was stage I and removed.  His father's colon malignancy was more advanced.  He had a colonoscopy performed on 04/30/2014.  This exam was to the cecum with good prep.  There were 3 polyps ranging from 3 to 7 mm in size taken from the ascending colon and transverse colon.  2 of these were found to be tubular adenoma and the other a sessile serrated polyp.  The remainder of the examination was unremarkable.  Past Medical History:  Diagnosis Date  . Chest pain, unspecified   . Esophageal reflux   . Hepatic steatosis   . Lumbago   . Nonspecific abnormal electrocardiogram (ECG) (EKG)   . Nonspecific abnormal results of liver function study   . Other abnormal glucose   . Sleep apnea    hx .Marland Kitchen..doesnt  wear mask    Past Surgical History:  Procedure Laterality Date  . EXCISION HAGLUND'S DEFORMITY WITH ACHILLES TENDON REPAIR Right 02/03/2018   Procedure: Right Haglund excision, Achilles tendon reconstruction;  Surgeon: Wylene Simmer, MD;  Location: [facility];  Service: Orthopedics;  Laterality: Right;  . GASTROC RECESSION EXTREMITY Right 02/03/2018   Procedure: GASTROC RECESSION EXTREMITY;  Surgeon: Wylene Simmer, MD;  Location: [facility];  Service: Orthopedics;  Laterality: Right;  . PALATE / UVULA BIOPSY / EXCISION  2013  . VASECTOMY  1986   . VASECTOMY REVERSAL      Outpatient Medications Prior to Visit  Medication Sig Dispense Refill  . pantoprazole (PROTONIX) 40 MG tablet TAKE 1 TABLET(40 MG) BY MOUTH TWICE DAILY 60 tablet 0   No facility-administered medications prior to visit.     Allergies  Allergen Reactions  . Motrin [Ibuprofen]     Hives; can take Ibuprofen; Motrin specific    Family History  Problem Relation Age of Onset  . Colon cancer Mother   . Colon cancer Father   . Diabetes Other   . Hypertension Other   . Cancer Other        Breast Cancer  . Colon cancer Paternal Uncle   . Early death Neg Hx   . Heart disease Neg Hx   . Hyperlipidemia Neg Hx   . Kidney disease Neg Hx   . Stroke Neg Hx   . Esophageal cancer Neg Hx   . Rectal cancer Neg Hx   . Stomach cancer Neg Hx     Social History   Tobacco Use  . Smoking status: Never Smoker  . Smokeless tobacco: Never Used  Substance Use Topics  . Alcohol use: Yes    Comment: rare  . Drug use: No    ROS: As per history of present illness, otherwise negative  There were no vitals taken for this visit. Constitutional: Well-developed and well-nourished. No distress. HEENT: Normocephalic and atraumatic.  Conjunctivae are normal.  No scleral icterus. Neck: Neck supple. Trachea midline. Cardiovascular: Normal rate, regular rhythm and intact distal pulses. No M/R/G Pulmonary/chest: Effort normal and breath sounds normal. No wheezing, rales or rhonchi. Abdominal: Soft, nontender, nondistended. Bowel sounds active throughout. There are no masses palpable. No hepatosplenomegaly. Extremities: no clubbing, cyanosis, or edema Neurological: Alert and oriented to person place and time. Skin: Skin is warm and dry.  Psychiatric: Normal mood and affect. Behavior is normal.  RELEVANT LABS AND IMAGING: CBC    Component Value Date/Time   WBC 6.8 06/06/2018 1009   RBC 4.49 06/06/2018 1009   HGB 13.8 06/06/2018 1009   HCT 39.8 06/06/2018 1009   PLT  217.0 06/06/2018 1009   MCV 88.5 06/06/2018 1009   MCHC 34.8 06/06/2018 1009   RDW 14.4 06/06/2018 1009   LYMPHSABS 1.8 06/06/2018 1009   MONOABS 0.6 06/06/2018 1009   EOSABS 0.1 06/06/2018 1009   BASOSABS 0.0 06/06/2018 1009    CMP     Component Value Date/Time   NA 139 06/06/2018 1009   K 3.9 06/06/2018 1009   CL 107 06/06/2018 1009   CO2 24 06/06/2018 1009   GLUCOSE 103 (H) 06/06/2018 1009   BUN 13 06/06/2018 1009   CREATININE 0.81 06/06/2018 1009   CALCIUM 9.7 06/06/2018 1009   PROT 7.2 06/06/2018 1009   ALBUMIN 4.3 06/06/2018 1009   AST 13 06/06/2018 1009   ALT 22 06/06/2018 1009   ALKPHOS 55 06/06/2018 1009   BILITOT 0.5  06/06/2018 1009   GFRNONAA 96.06 10/25/2009 0834   ABDOMEN ULTRASOUND COMPLETE   COMPARISON:  Lumbar MRI 08/19/2016.   FINDINGS: Gallbladder: No gallstones or wall thickening visualized. No sonographic Murphy sign noted by sonographer.   Common bile duct: Diameter: 2 millimeters, normal.   Liver: Echogenic liver (image 36). There is an area of subtle geographic heterogeneity in the right lobe on image 24.   Additionally there are two small hypoechoic lesions within the right lobe. The 1st area in the right lobe measures 17 millimeters on image 31 and demonstrates adjacent vascularity (image 33).   A 2nd 17 millimeter area in the left lobe appears to be a simple cyst (image 46). Portal vein is patent on color Doppler imaging with normal direction of blood flow towards the liver.   IVC: No abnormality visualized.   Pancreas: Visualized portion unremarkable.   Spleen: Normal splenic size and echotexture.   Right Kidney: Length: 10.8 centimeters. Echogenicity within normal limits. No mass or hydronephrosis visualized.   Left Kidney: Length: 12.3 centimeters. Echogenicity within normal limits. No mass or hydronephrosis visualized.   Abdominal aorta: No aneurysm visualized.   Other findings: No free fluid.   IMPRESSION: 1. Hepatic  steatosis suspected. Small benign left hepatic lobe cyst but also a small 17 mm indeterminate hypoechoic right lobe lesion (image 31). Benign etiology is favored here too but recommend Abdomen MRI (liver protocol without and with contrast) to definitively characterize. This recommendation was adaptive from ACR consensus guidelines: Management of Incidental Liver Lesions on CT: A White Paper of the ACR Incidental Findings Committee. J Am Coll Radiol 2017; 14:7829-5621. 2. Otherwise normal ultrasound appearance of the abdomen. No explanation for left upper quadrant pain.     Electronically Signed   By: Genevie Ann M.D.   On: 06/16/2018 15:50   MRI ABDOMEN WITHOUT AND WITH CONTRAST (INCLUDING MRCP)   TECHNIQUE: Multiplanar multisequence MR imaging of the abdomen was performed both before and after the administration of intravenous contrast. Heavily T2-weighted images of the biliary and pancreatic ducts were obtained, and three-dimensional MRCP images were rendered by post processing.   CONTRAST:  32mL MULTIHANCE GADOBENATE DIMEGLUMINE 529 MG/ML IV SOLN   COMPARISON:  Ultrasound on 06/16/2018   FINDINGS: Lower chest: No acute findings.   Hepatobiliary: Moderate diffuse hepatic steatosis. A small benign cyst is seen in segment 2 of the left lobe. However, there are multiple other small masses within the right and left lobes which show arterial phase hyperenhancement, multiple with pattern of peripheral rim enhancement. These lesions show moderate T2 hyperintensity. These characteristics are suspicious for hepatic metastases, and differential diagnosis also includes multifocal hepatocellular carcinoma.   Gallbladder is unremarkable. No evidence of biliary ductal dilatation.   Pancreas:  No mass or inflammatory changes.   Spleen:  Within normal limits in size and appearance.   Adrenals/Urinary Tract: No masses identified. No evidence of hydronephrosis.   Stomach/Bowel:  Visualized portion unremarkable.   Vascular/Lymphatic: No pathologically enlarged lymph nodes identified. No abdominal aortic aneurysm.   Other:  None.   Musculoskeletal:  No suspicious bone lesions identified.   IMPRESSION: Multiple small hypervascular masses showing peripheral rim enhancement in both right and left hepatic lobes, suspicious for liver metastases, with differential diagnosis including multifocal hepatocellular carcinoma. Consider colonoscopy and PET-CT scan for further evaluation.   No other sites of malignancy identified within the abdomen.   Hepatic steatosis.   These results will be called to the ordering clinician or representative by the Radiologist Assistant,  and communication documented in the PACS or zVision Dashboard.     Electronically Signed   By: Earle Gell M.D.   On: 07/06/2018 09:24  ASSESSMENT/PLAN: 57 year old male with a past medical history of adenomatous colon polyp and sessile serrated colon polyp, family history of colon cancer, sleep apnea, fatty liver who is seen in consult at the request of Jodi Mourning, FNP to evaluate abnormal liver imaging and epigastric pain.  1.  Liver lesions concerning for malignancy/epigastric pain/history of colon polyps/family history of colon cancer -we went over the MRI results in detail together today.  I am concerned with these findings and feel that we need to move quickly for upper endoscopy and colonoscopy to exclude a upper GI and lower GI source for his liver lesions concerning for a metastatic process.  We discussed the risk, benefits and alternatives to upper and lower endoscopy and he is agreeable and wishes to proceed.  I will add him on for tomorrow morning at 7:30 AM.  The MRI does not show any abnormality of the bowel wall and thus if his upper endoscopy and colonoscopy are negative I would recommend that we attempt percutaneous liver biopsy from 1 of the suspicious masses.  Further  recommendations after procedures tomorrow.    QI:[id number], Marvis Repress, [place], [place] [address]

## 2018-07-14 ENCOUNTER — Encounter: Payer: Self-pay | Admitting: Internal Medicine

## 2018-07-14 ENCOUNTER — Telehealth: Payer: Self-pay

## 2018-07-14 ENCOUNTER — Ambulatory Visit (AMBULATORY_SURGERY_CENTER): Payer: BLUE CROSS/BLUE SHIELD | Admitting: Internal Medicine

## 2018-07-14 VITALS — BP 117/74 | HR 89 | Temp 98.0°F | Resp 15 | Ht 70.0 in | Wt 252.0 lb

## 2018-07-14 DIAGNOSIS — R1013 Epigastric pain: Secondary | ICD-10-CM | POA: Diagnosis not present

## 2018-07-14 DIAGNOSIS — R932 Abnormal findings on diagnostic imaging of liver and biliary tract: Secondary | ICD-10-CM

## 2018-07-14 DIAGNOSIS — D120 Benign neoplasm of cecum: Secondary | ICD-10-CM

## 2018-07-14 DIAGNOSIS — Z86010 Personal history of colonic polyps: Secondary | ICD-10-CM

## 2018-07-14 DIAGNOSIS — D122 Benign neoplasm of ascending colon: Secondary | ICD-10-CM

## 2018-07-14 DIAGNOSIS — Z800 Family history of malignant neoplasm of digestive organs: Secondary | ICD-10-CM | POA: Diagnosis not present

## 2018-07-14 DIAGNOSIS — K635 Polyp of colon: Secondary | ICD-10-CM

## 2018-07-14 DIAGNOSIS — D123 Benign neoplasm of transverse colon: Secondary | ICD-10-CM

## 2018-07-14 LAB — HM COLONOSCOPY

## 2018-07-14 MED ORDER — SODIUM CHLORIDE 0.9 % IV SOLN: 500.0000 mL | Freq: Once | INTRAVENOUS | Status: DC

## 2018-07-14 NOTE — Progress Notes (Signed)
Called to room to assist during endoscopic procedure.  Patient ID and intended procedure confirmed with present staff. Received instructions for my participation in the procedure from the performing physician.  

## 2018-07-14 NOTE — Patient Instructions (Addendum)
Impression/Recommendations:  Polyp handout given to patient. Hemorrhoid handout given to patient.  Resume previous diet. Continue present medications.  Repeat colonoscopy recommended for surveillance.  Date to be determined after pathology results reviewed.  Refer to an interventional radiologist for percutaneous biopsy of suspicious liver lesions.  YOU HAD AN ENDOSCOPIC PROCEDURE TODAY AT [facility]:   Refer to the procedure report that was given to you for any specific questions about what was found during the examination.  If the procedure report does not answer your questions, please call your gastroenterologist to clarify.  If you requested that your care partner not be given the details of your procedure findings, then the procedure report has been included in a sealed envelope for you to review at your convenience later.  YOU SHOULD EXPECT: Some feelings of bloating in the abdomen. Passage of more gas than usual.  Walking can help get rid of the air that was put into your GI tract during the procedure and reduce the bloating. If you had a lower endoscopy (such as a colonoscopy or flexible sigmoidoscopy) you may notice spotting of blood in your stool or on the toilet paper. If you underwent a bowel prep for your procedure, you may not have a normal bowel movement for a few days.  Please Note:  You might notice some irritation and congestion in your nose or some drainage.  This is from the oxygen used during your procedure.  There is no need for concern and it should clear up in a day or so.  SYMPTOMS TO REPORT IMMEDIATELY:   Following lower endoscopy (colonoscopy or flexible sigmoidoscopy):  Excessive amounts of blood in the stool  Significant tenderness or worsening of abdominal pains  Swelling of the abdomen that is new, acute  Fever of 100F or higher   Following upper endoscopy (EGD)  Vomiting of blood or coffee ground material  New chest pain or pain under  the shoulder blades  Painful or persistently difficult swallowing  New shortness of breath  Fever of 100F or higher  Black, tarry-looking stools  For urgent or emergent issues, a gastroenterologist can be reached at any hour by calling [phone].   DIET:  We do recommend a small meal at first, but then you may proceed to your regular diet.  Drink plenty of fluids but you should avoid alcoholic beverages for 24 hours.  ACTIVITY:  You should plan to take it easy for the rest of today and you should NOT DRIVE or use heavy machinery until tomorrow (because of the sedation medicines used during the test).    FOLLOW UP: Our staff will call the number listed on your records the next business day following your procedure to check on you and address any questions or concerns that you may have regarding the information given to you following your procedure. If we do not reach you, we will leave a message.  However, if you are feeling well and you are not experiencing any problems, there is no need to return our call.  We will assume that you have returned to your regular daily activities without incident.  If any biopsies were taken you will be contacted by phone or by letter within the next 1-3 weeks.  Please call us at [phone] if you have not heard about the biopsies in 3 weeks.    SIGNATURES/CONFIDENTIALITY: You and/or your care partner have signed paperwork which will be entered into your electronic medical record.  These signatures  attest to the fact that that the information above on your After Visit Summary has been reviewed and is understood.  Full responsibility of the confidentiality of this discharge information lies with you and/or your care-partner.

## 2018-07-14 NOTE — Telephone Encounter (Signed)
Clarified with Dr. Hilarie Fredrickson he is not requesting a separate IR consult.  Orders placed for liver biopsy.   Left message for patient to call back

## 2018-07-14 NOTE — Op Note (Signed)
[place] Patient Name: Henry Lang Procedure Date: 07/14/2018 7:35 AM MRN: [id number] Endoscopist: Jerene Bears , MD Age: 57 Referring MD:  Date of Birth: 06-Aug-1961 Gender: Male Account #: [id number] Procedure:                Upper GI endoscopy Indications:              Epigastric abdominal pain, Abnormal MRI of the GI                            tract suggestion of liver metastases Medicines:                Monitored Anesthesia Care Procedure:                Pre-Anesthesia Assessment:                           - Prior to the procedure, a History and Physical                            was performed, and patient medications and                            allergies were reviewed. The patient's tolerance of                            previous anesthesia was also reviewed. The risks                            and benefits of the procedure and the sedation                            options and risks were discussed with the patient.                            All questions were answered, and informed consent                            was obtained. Prior Anticoagulants: The patient has                            taken no previous anticoagulant or antiplatelet                            agents. ASA Grade Assessment: II - A patient with                            mild systemic disease. After reviewing the risks                            and benefits, the patient was deemed in                            satisfactory condition to undergo the procedure.  After obtaining informed consent, the endoscope was                            passed under direct vision. Throughout the                            procedure, the patient's blood pressure, pulse, and                            oxygen saturations were monitored continuously. The                            Endoscope was introduced through the mouth, and                            advanced to the third  part of duodenum. The upper                            GI endoscopy was accomplished without difficulty.                            The patient tolerated the procedure well. Scope In: Scope Out: Findings:                 The esophagus was normal.                           The stomach was normal.                           The examined duodenum was normal. Complications:            No immediate complications. Estimated Blood Loss:     Estimated blood loss: none. Impression:               - Normal esophagus.                           - Normal stomach.                           - Normal examined duodenum.                           - No specimens collected. Recommendation:           - Patient has a contact number available for                            emergencies. The signs and symptoms of potential                            delayed complications were discussed with the                            patient. Return to normal activities tomorrow.  Written discharge instructions were provided to the                            patient.                           - Resume previous diet.                           - Continue present medications.                           - See the other procedure note for documentation of                            additional recommendations. Jerene Bears, MD 07/14/2018 8:08:49 AM This report has been signed electronically.

## 2018-07-14 NOTE — Op Note (Signed)
[facility] Patient Name: Henry Lang Procedure Date: 07/14/2018 7:34 AM MRN: [id number] Endoscopist: Jerene Bears , MD Age: 57 Referring MD:  Date of Birth: 04-09-1961 Gender: Male Account #: [id number] Procedure:                Colonoscopy Indications:              Family history of colon cancer in multiple                            first-degree relatives, Personal history of colonic                            polyps, Abnormal MRI of the GI tract suggesting                            metastatic lesions in the liver, last colonoscopy 4                            years ago Medicines:                Monitored Anesthesia Care Procedure:                Pre-Anesthesia Assessment:                           - Prior to the procedure, a History and Physical                            was performed, and patient medications and                            allergies were reviewed. The patient's tolerance of                            previous anesthesia was also reviewed. The risks                            and benefits of the procedure and the sedation                            options and risks were discussed with the patient.                            All questions were answered, and informed consent                            was obtained. Prior Anticoagulants: The patient has                            taken no previous anticoagulant or antiplatelet                            agents. ASA Grade Assessment: II - A patient with  mild systemic disease. After reviewing the risks                            and benefits, the patient was deemed in                            satisfactory condition to undergo the procedure.                           After obtaining informed consent, the colonoscope                            was passed under direct vision. Throughout the                            procedure, the patient's blood pressure, pulse, and                             oxygen saturations were monitored continuously. The                            Colonoscope was introduced through the anus and                            advanced to the terminal ileum. The colonoscopy was                            performed without difficulty. The patient tolerated                            the procedure well. The quality of the bowel                            preparation was good. The terminal ileum, ileocecal                            valve, appendiceal orifice, and rectum were                            photographed. Scope In: 7:44:33 AM Scope Out: 8:05:40 AM Scope Withdrawal Time: 0 hours 18 minutes 40 seconds  Total Procedure Duration: 0 hours 21 minutes 7 seconds  Findings:                 The terminal ileum appeared normal.                           A 5 mm polyp was found in the cecum. The polyp was                            sessile. The polyp was removed with a cold snare.                            Resection and retrieval were complete.  Four sessile polyps were found in the ascending                            colon. The polyps were 2 to 5 mm in size. These                            polyps were removed with a cold snare. Resection                            and retrieval were complete.                           Five sessile polyps were found in the transverse                            colon. The polyps were 2 to 4 mm in size. These                            polyps were removed with a cold snare. Resection                            was complete, but the polyp tissue was only                            partially retrieved (4 of 5 polyps retrieved, 2 mm                            distal transverse polyp not retrieved).                           Internal hemorrhoids were found during                            retroflexion. The hemorrhoids were small.                           The exam was otherwise without  abnormality. Complications:            No immediate complications. Estimated Blood Loss:     Estimated blood loss was minimal. Impression:               - The examined portion of the ileum was normal.                           - One 5 mm polyp in the cecum, removed with a cold                            snare. Resected and retrieved.                           - Four 2 to 5 mm polyps in the ascending colon,  removed with a cold snare. Resected and retrieved.                           - Five 2 to 4 mm polyps in the transverse colon,                            removed with a cold snare. Resected and retrieved.                           - Small internal hemorrhoids.                           - The examination was otherwise normal. Recommendation:           - Patient has a contact number available for                            emergencies. The signs and symptoms of potential                            delayed complications were discussed with the                            patient. Return to normal activities tomorrow.                            Written discharge instructions were provided to the                            patient.                           - Resume previous diet.                           - Continue present medications.                           - Await pathology results.                           - Repeat colonoscopy is recommended for                            surveillance. The colonoscopy date will be                            determined after pathology results from today's                            exam become available for review.                           - Refer to an interventional radiologist at  appointment to be scheduled for percutaneous biopsy                            of suspicious liver lesions. Jerene Bears, MD 07/14/2018 8:15:27 AM This report has been signed electronically.

## 2018-07-14 NOTE — Telephone Encounter (Signed)
-----   Message from Jerene Bears, MD sent at 07/14/2018  8:18 AM EDT ----- Please arrange IR consult/percutaneous biopsy of suspicious liver masses EGD colonoscopy negative JMP

## 2018-07-14 NOTE — Telephone Encounter (Signed)
Patient notified that he will be contacted directly by IR to arrange liver bx

## 2018-07-14 NOTE — Progress Notes (Signed)
Report given to PACU, vss 

## 2018-07-14 NOTE — Progress Notes (Signed)
Pt's states no medical or surgical changes since previsit or office visit. 

## 2018-07-15 ENCOUNTER — Telehealth: Payer: Self-pay

## 2018-07-15 NOTE — Telephone Encounter (Signed)
Patient has been scheduled for 07/19/18

## 2018-07-15 NOTE — Telephone Encounter (Signed)
  Follow up Call-  Call back number 07/14/2018  Post procedure Call Back phone  # [phone]  Permission to leave phone message Yes  Some recent data might be hidden     Patient questions:  Do you have a fever, pain , or abdominal swelling? No. Pain Score  0 *  Have you tolerated food without any problems? Yes.    Have you been able to return to your normal activities? Yes.    Do you have any questions about your discharge instructions: Diet   No. Medications  No. Follow up visit  No.  Do you have questions or concerns about your Care? No.  Actions: * If pain score is 4 or above: No action needed, pain <4.

## 2018-07-18 ENCOUNTER — Other Ambulatory Visit: Payer: Self-pay | Admitting: Radiology

## 2018-07-19 ENCOUNTER — Other Ambulatory Visit: Payer: Self-pay

## 2018-07-19 ENCOUNTER — Encounter (HOSPITAL_COMMUNITY): Payer: Self-pay

## 2018-07-19 ENCOUNTER — Ambulatory Visit (HOSPITAL_COMMUNITY)
Admission: RE | Admit: 2018-07-19 | Discharge: 2018-07-19 | Disposition: A | Discharge status: Home / Self Care | Payer: BLUE CROSS/BLUE SHIELD | Source: Ambulatory Visit | Attending: Internal Medicine | Admitting: Internal Medicine

## 2018-07-19 DIAGNOSIS — Z800 Family history of malignant neoplasm of digestive organs: Secondary | ICD-10-CM | POA: Diagnosis not present

## 2018-07-19 DIAGNOSIS — K760 Fatty (change of) liver, not elsewhere classified: Secondary | ICD-10-CM | POA: Diagnosis not present

## 2018-07-19 DIAGNOSIS — Z79899 Other long term (current) drug therapy: Secondary | ICD-10-CM | POA: Diagnosis not present

## 2018-07-19 DIAGNOSIS — K769 Liver disease, unspecified: Secondary | ICD-10-CM | POA: Diagnosis not present

## 2018-07-19 DIAGNOSIS — K219 Gastro-esophageal reflux disease without esophagitis: Secondary | ICD-10-CM | POA: Insufficient documentation

## 2018-07-19 DIAGNOSIS — Z886 Allergy status to analgesic agent status: Secondary | ICD-10-CM | POA: Diagnosis not present

## 2018-07-19 DIAGNOSIS — R932 Abnormal findings on diagnostic imaging of liver and biliary tract: Secondary | ICD-10-CM | POA: Diagnosis not present

## 2018-07-19 LAB — CBC
HEMATOCRIT: 42.9 % (ref 39.0–52.0)
HEMOGLOBIN: 14 g/dL (ref 13.0–17.0)
MCH: 30 pg (ref 26.0–34.0)
MCHC: 32.6 g/dL (ref 30.0–36.0)
MCV: 92.1 fL (ref 80.0–100.0)
NRBC: 0 % (ref 0.0–0.2)
Platelets: 231 10*3/uL (ref 150–400)
RBC: 4.66 MIL/uL (ref 4.22–5.81)
RDW: 13 % (ref 11.5–15.5)
WBC: 5.9 10*3/uL (ref 4.0–10.5)

## 2018-07-19 LAB — PROTIME-INR
INR: 1
Prothrombin Time: 13.1 seconds (ref 11.4–15.2)

## 2018-07-19 MED ORDER — GELATIN ABSORBABLE 12-7 MM EX MISC
CUTANEOUS | Status: AC
  Filled 2018-07-19: qty 1

## 2018-07-19 MED ORDER — MIDAZOLAM HCL 2 MG/2ML IJ SOLN
INTRAMUSCULAR | Status: AC
  Filled 2018-07-19: qty 2

## 2018-07-19 MED ORDER — FENTANYL CITRATE (PF) 100 MCG/2ML IJ SOLN
INTRAMUSCULAR | Status: AC | PRN
  Administered 2018-07-19: 50 ug via INTRAVENOUS
  Administered 2018-07-19: 25 ug via INTRAVENOUS

## 2018-07-19 MED ORDER — MIDAZOLAM HCL 2 MG/2ML IJ SOLN
INTRAMUSCULAR | Status: AC | PRN
  Administered 2018-07-19: 1 mg via INTRAVENOUS

## 2018-07-19 MED ORDER — FENTANYL CITRATE (PF) 100 MCG/2ML IJ SOLN
INTRAMUSCULAR | Status: AC
  Filled 2018-07-19: qty 2

## 2018-07-19 MED ORDER — SODIUM CHLORIDE 0.9 % IV SOLN: INTRAVENOUS | Status: DC

## 2018-07-19 MED ORDER — LIDOCAINE HCL (PF) 1 % IJ SOLN
INTRAMUSCULAR | Status: AC
  Filled 2018-07-19: qty 30

## 2018-07-19 NOTE — Procedures (Signed)
Interventional Radiology Procedure Note  Procedure: US guided liver mass biopsy.    Complications: None Recommendations:  - 2 hours observation - Ok to shower tomorrow - Do not submerge for 7 days - Routine care   Signed,  Dulcy Fanny. Earleen Newport, DO

## 2018-07-19 NOTE — Discharge Instructions (Addendum)
Liver Biopsy, Care After °Refer to this sheet in the next few weeks. These instructions provide you with information on caring for yourself after your procedure. Your health care provider may also give you more specific instructions. Your treatment has been planned according to current medical practices, but problems sometimes occur. Call your health care provider if you have any problems or questions after your procedure. °What can I expect after the procedure? °After your procedure, it is typical to have the following: °· A small amount of discomfort in the area where the biopsy was done and in the right shoulder or shoulder blade. °· A small amount of bruising around the area where the biopsy was done and on the skin over the liver. °· Sleepiness and fatigue for the rest of the day. ° °Follow these instructions at home: °· Rest at home for 1-2 days or as directed by your health care provider. °· Have a friend or family member stay with you for at least 24 hours. °· Because of the medicines used during the procedure, you should not do the following things in the first 24 hours: °? Drive. °? Use machinery. °? Be responsible for the care of other people. °? Sign legal documents. °? Take a bath or shower. °· There are many different ways to close and cover an incision, including stitches, skin glue, and adhesive strips. Follow your health care provider's instructions on: °? Incision care. °? Bandage (dressing) changes and removal. °? Incision closure removal. °· Do not drink alcohol in the first week. °· Do not lift more than 5 pounds or play contact sports for 2 weeks after this test. °· Take medicines only as directed by your health care provider. Do not take medicine containing aspirin or non-steroidal anti-inflammatory medicines such as ibuprofen for 1 week after this test. °· It is your responsibility to get your test results. °Contact a health care provider if: °· You have increased bleeding from an incision  that results in more than a small spot of blood. °· You have redness, swelling, or increasing pain in any incisions. °· You notice a discharge or a bad smell coming from any of your incisions. °· You have a fever or chills. °Get help right away if: °· You develop swelling, bloating, or pain in your abdomen. °· You become dizzy or faint. °· You develop a rash. °· You are nauseous or vomit. °· You have difficulty breathing, feel short of breath, or feel faint. °· You develop chest pain. °· You have problems with your speech or vision. °· You have trouble balancing or moving your arms or legs. °This information is not intended to replace advice given to you by your health care provider. Make sure you discuss any questions you have with your health care provider. °Document Released: 03/20/2005 Document Revised: 02/06/2016 Document Reviewed: 10/27/2013 °Elsevier Interactive Patient Education © 2018 [employer] °Moderate Conscious Sedation, Adult, Care After °These instructions provide you with information about caring for yourself after your procedure. Your health care provider may also give you more specific instructions. Your treatment has been planned according to current medical practices, but problems sometimes occur. Call your health care provider if you have any problems or questions after your procedure. °What can I expect after the procedure? °After your procedure, it is common: °· To feel sleepy for several hours. °· To feel clumsy and have poor balance for several hours. °· To have poor judgment for several hours. °· To vomit if you eat   too soon. ° °Follow these instructions at home: °For at least 24 hours after the procedure: ° °· Do not: °? Participate in activities where you could fall or become injured. °? Drive. °? Use heavy machinery. °? Drink alcohol. °? Take sleeping pills or medicines that cause drowsiness. °? Make important decisions or sign legal documents. °? Take care of children on your  own. °· Rest. °Eating and drinking °· Follow the diet recommended by your health care provider. °· If you vomit: °? Drink water, juice, or soup when you can drink without vomiting. °? Make sure you have little or no nausea before eating solid foods. °General instructions °· Have a responsible adult stay with you until you are awake and alert. °· Take over-the-counter and prescription medicines only as told by your health care provider. °· If you smoke, do not smoke without supervision. °· Keep all follow-up visits as told by your health care provider. This is important. °Contact a health care provider if: °· You keep feeling nauseous or you keep vomiting. °· You feel light-headed. °· You develop a rash. °· You have a fever. °Get help right away if: °· You have trouble breathing. °This information is not intended to replace advice given to you by your health care provider. Make sure you discuss any questions you have with your health care provider. °Document Released: 06/21/2013 Document Revised: 02/03/2016 Document Reviewed: 12/21/2015 °Elsevier Interactive Patient Education © 2018 [employer] Inc[employer] ° °

## 2018-07-19 NOTE — H&P (Signed)
Chief Complaint: Patient was seen in consultation today for liver lesion biopsy.   Referring Physician(s): Pyrtle,Jay M  Supervising Physician: Corrie Mckusick  Patient Status: [facility] - Out-pt  History of Present Illness: Henry Lang is a 57 y.o. male with a past medical history significant for esophageal reflux, lumbago and sleep apnea who presents today for liver lesion biopsy. Patient was seen by his PCP on 06/06/18 for intermittent LUQ pain for several months with no alleviating or aggravating factors. Abdominal ultrasound was performed on 06/16/18 which showed hepatic steatosis, a small left hepatic lobe cyst and a 17 mm indeterminate hypechoic right lobe lesion. He underwent MR abdomen on 07/05/18 for further evaluation - MRI showed multiple small hypervascular masses showing peripheral rim enhancement in right and left hepatic lobes concerning for metastases. He was seen by GI on 10/30 and underwent EGD/colonoscopy on 10/31 which was essentially normal. He presents today for a biopsy of the liver lesion to direct further evaluation and treatment.  Patient states he still has occasional LUQ pain but none currently, he denies any other complaints. He states understanding to procedure and wishes to proceed.  Past Medical History:  Diagnosis Date  . Chest pain, unspecified   . Esophageal reflux   . Hepatic steatosis   . Lumbago   . Nonspecific abnormal electrocardiogram (ECG) (EKG)   . Nonspecific abnormal results of liver function study   . Other abnormal glucose   . Sleep apnea    hx .Marland Kitchen..doesnt wear mask    Past Surgical History:  Procedure Laterality Date  . EXCISION HAGLUND'S DEFORMITY WITH ACHILLES TENDON REPAIR Right 02/03/2018   Procedure: Right Haglund excision, Achilles tendon reconstruction;  Surgeon: Wylene Simmer, MD;  Location: [place];  Service: Orthopedics;  Laterality: Right;  . GASTROC RECESSION EXTREMITY Right 02/03/2018   Procedure: GASTROC  RECESSION EXTREMITY;  Surgeon: Wylene Simmer, MD;  Location: [place];  Service: Orthopedics;  Laterality: Right;  . PALATE / UVULA BIOPSY / EXCISION  2013  . VASECTOMY  1986  . VASECTOMY REVERSAL      Allergies: Motrin [ibuprofen]  Medications: Prior to Admission medications   Medication Sig Start Date End Date Taking? Authorizing Provider  pantoprazole (PROTONIX) 40 MG tablet TAKE 1 TABLET(40 MG) BY MOUTH TWICE DAILY Patient taking differently: Take 40 mg by mouth 2 (two) times daily.  07/04/18  Yes Marrian Salvage, FNP     Family History  Problem Relation Age of Onset  . Colon cancer Mother   . Colon cancer Father   . Diabetes Other   . Hypertension Other   . Cancer Other        Breast Cancer  . Colon cancer Paternal Uncle   . Early death Neg Hx   . Heart disease Neg Hx   . Hyperlipidemia Neg Hx   . Kidney disease Neg Hx   . Stroke Neg Hx   . Esophageal cancer Neg Hx   . Rectal cancer Neg Hx   . Stomach cancer Neg Hx     Social History   Socioeconomic History  . Marital status: Married    Spouse name: Not on file  . Number of children: Not on file  . Years of education: Not on file  . Highest education level: Not on file  Occupational History  . Not on file  Social Needs  . Financial resource strain: Not on file  . Food insecurity:    Worry: Not on file  Inability: Not on file  . Transportation needs:    Medical: Not on file    Non-medical: Not on file  Tobacco Use  . Smoking status: Never Smoker  . Smokeless tobacco: Never Used  Substance and Sexual Activity  . Alcohol use: Yes    Comment: rare  . Drug use: No  . Sexual activity: Yes    Partners: Female  Lifestyle  . Physical activity:    Days per week: Not on file    Minutes per session: Not on file  . Stress: Not on file  Relationships  . Social connections:    Talks on phone: Not on file    Gets together: Not on file    Attends religious service: Not on file     Active member of club or organization: Not on file    Attends meetings of clubs or organizations: Not on file    Relationship status: Not on file  Other Topics Concern  . Not on file  Social History Narrative  . Not on file     Review of Systems: A 12 point ROS discussed and pertinent positives are indicated in the HPI above.  All other systems are negative.  Review of Systems  Constitutional: Negative for appetite change, chills, fatigue and fever.  Respiratory: Negative for cough and shortness of breath.   Cardiovascular: Negative for chest pain.  Gastrointestinal: Negative for abdominal distention, abdominal pain, diarrhea, nausea and vomiting.  Skin: Negative for rash.  Neurological: Negative for dizziness and syncope.  Psychiatric/Behavioral: Negative for confusion.    Vital Signs: BP 131/85   Pulse 80   Temp 98.2 F (36.8 C) (Oral)   Resp 16   Ht 5\' 10"  (1.778 m)   Wt 248 lb (112.5 kg)   SpO2 98%   BMI 35.58 kg/m   Physical Exam  Constitutional: He is oriented to person, place, and time. No distress.  HENT:  Head: Normocephalic.  Cardiovascular: Normal rate, regular rhythm and normal heart sounds.  Pulmonary/Chest: Effort normal and breath sounds normal.  Abdominal: Soft. He exhibits no distension and no mass. There is no tenderness.  Neurological: He is alert and oriented to person, place, and time.  Skin: Skin is warm and dry. He is not diaphoretic.  Psychiatric: He has a normal mood and affect. His behavior is normal. Judgment and thought content normal.  Vitals reviewed.    MD Evaluation Airway: WNL Heart: WNL Abdomen: WNL Chest/ Lungs: WNL ASA  Classification: 2 Mallampati/Airway Score: One   Imaging: Mr Abdomen W Wo Contrast  Result Date: 07/06/2018 CLINICAL DATA:  Upper abdominal pain. Indeterminate liver lesion on recent ultrasound. EXAM: MRI ABDOMEN WITHOUT AND WITH CONTRAST (INCLUDING MRCP) TECHNIQUE: Multiplanar multisequence MR imaging of  the abdomen was performed both before and after the administration of intravenous contrast. Heavily T2-weighted images of the biliary and pancreatic ducts were obtained, and three-dimensional MRCP images were rendered by post processing. CONTRAST:  8mL MULTIHANCE GADOBENATE DIMEGLUMINE 529 MG/ML IV SOLN COMPARISON:  Ultrasound on 06/16/2018 FINDINGS: Lower chest: No acute findings. Hepatobiliary: Moderate diffuse hepatic steatosis. A small benign cyst is seen in segment 2 of the left lobe. However, there are multiple other small masses within the right and left lobes which show arterial phase hyperenhancement, multiple with pattern of peripheral rim enhancement. These lesions show moderate T2 hyperintensity. These characteristics are suspicious for hepatic metastases, and differential diagnosis also includes multifocal hepatocellular carcinoma. Gallbladder is unremarkable. No evidence of biliary ductal dilatation. Pancreas:  No  mass or inflammatory changes. Spleen:  Within normal limits in size and appearance. Adrenals/Urinary Tract: No masses identified. No evidence of hydronephrosis. Stomach/Bowel: Visualized portion unremarkable. Vascular/Lymphatic: No pathologically enlarged lymph nodes identified. No abdominal aortic aneurysm. Other:  None. Musculoskeletal:  No suspicious bone lesions identified. IMPRESSION: Multiple small hypervascular masses showing peripheral rim enhancement in both right and left hepatic lobes, suspicious for liver metastases, with differential diagnosis including multifocal hepatocellular carcinoma. Consider colonoscopy and PET-CT scan for further evaluation. No other sites of malignancy identified within the abdomen. Hepatic steatosis. These results will be called to the ordering clinician or representative by the Radiologist Assistant, and communication documented in the PACS or zVision Dashboard. Electronically Signed   By: Earle Gell M.D.   On: 07/06/2018 09:24     Labs:  CBC: Recent Labs    06/06/18 1009 07/19/18 0615  WBC 6.8 5.9  HGB 13.8 14.0  HCT 39.8 42.9  PLT 217.0 231    COAGS: Recent Labs    07/19/18 0615  INR 1.00    BMP: Recent Labs    06/06/18 1009  NA 139  K 3.9  CL 107  CO2 24  GLUCOSE 103*  BUN 13  CALCIUM 9.7  CREATININE 0.81    LIVER FUNCTION TESTS: Recent Labs    06/06/18 1009  BILITOT 0.5  AST 13  ALT 22  ALKPHOS 55  PROT 7.2  ALBUMIN 4.3    TUMOR MARKERS: No results for input(s): AFPTM, CEA, CA199, CHROMGRNA in the last 8760 hours.  Assessment and Plan:  Indeterminate liver lesion seen on abdominal US from 06/16/18, MR abdomen 10/22 showed multiple small hypervascular masses concerning for metastases, EGD/colonoscopy 10/31 without significant findings. Request to IR for liver lesion biopsy for further evaluation.   Patient is afebrile, NPO since last night, does not take blood thinning medications, WBC 5.9, H/H 14.0/42.9, INR 1.00. He understands indication for procedure and wishes to proceed.  Risks and benefits discussed with the patient including, but not limited to bleeding, infection, damage to adjacent structures or low yield requiring additional tests.  All of the patient's questions were answered, patient is agreeable to proceed.  Consent signed and in chart.  Thank you for this interesting consult.  I greatly enjoyed meeting Henry Lang and look forward to participating in their care.  A copy of this report was sent to the requesting provider on this date.  Electronically Signed: Joaquim Nam, PA-C 07/19/2018, 7:51 AM   I spent a total of  30 Minutes  in face to face in clinical consultation, greater than 50% of which was counseling/coordinating care for liver lesion biopsy.

## 2018-07-20 ENCOUNTER — Telehealth: Payer: Self-pay | Admitting: Internal Medicine

## 2018-07-20 NOTE — Telephone Encounter (Signed)
I spoke to the patient's wife by phone I explained that I do not have liver biopsy results back but either way I would like him to meet with oncology given suspicion for malignancy based on MRI findings The liver lesion which was biopsied was very small and thus the diagnostic yield may be low I will notify the patient and his wife once I have those results He has been referred to Dr. Benay Spice  She thanked me for the call

## 2018-07-20 NOTE — Telephone Encounter (Signed)
Noted  

## 2018-07-20 NOTE — Telephone Encounter (Signed)
Spoke with pts wife and let her know the results from liver bx are not back yet. She saw something on mychart and was to nervous to look. She does not want her husband to get a call with results and her not be there. She wanted to know if they could schedule an appt for Friday to come and discuss results. Explained to her that we have referred him to Dr. Benay Spice. She wants to discuss things further with Dr. Hilarie Fredrickson. She is leaning toward a referral to [place] a call from Dr. Hilarie Fredrickson. Dr. Hilarie Fredrickson notified.

## 2018-07-22 ENCOUNTER — Telehealth: Payer: Self-pay | Admitting: Oncology

## 2018-07-22 ENCOUNTER — Telehealth: Payer: Self-pay

## 2018-07-22 NOTE — Telephone Encounter (Signed)
Cld and spoke to Henry Lang. He's been scheduled to see Dr. Benay Spice on 11/13 at 2pm. Pt aware to arrive 30 minutes early.

## 2018-07-26 ENCOUNTER — Telehealth: Payer: Self-pay | Admitting: Internal Medicine

## 2018-07-26 NOTE — Telephone Encounter (Signed)
I called patient with liver biopsy results which did not show malignancy I spoke to the patient's wife Showed fatty liver with mild inflammation, no dysplasia or malignancy He has appointment tomorrow with Dr. Benay Spice given concern for possible metastatic lesions within the liver No evidence of malignancy found on recent GI evaluation or a liver biopsy, which is reassuring but will await Dr. Gearldine Shown opinion sHe thanked me for the call No path letter needed

## 2018-07-26 NOTE — Telephone Encounter (Signed)
Wife calling for path results from liver bx.

## 2018-07-27 ENCOUNTER — Inpatient Hospital Stay: Payer: BLUE CROSS/BLUE SHIELD

## 2018-07-27 ENCOUNTER — Inpatient Hospital Stay: Payer: BLUE CROSS/BLUE SHIELD | Attending: Oncology | Admitting: Oncology

## 2018-07-27 VITALS — BP 117/77 | HR 100 | Temp 98.4°F | Resp 15 | Ht 70.0 in | Wt 252.6 lb

## 2018-07-27 DIAGNOSIS — Z803 Family history of malignant neoplasm of breast: Secondary | ICD-10-CM | POA: Insufficient documentation

## 2018-07-27 DIAGNOSIS — K219 Gastro-esophageal reflux disease without esophagitis: Secondary | ICD-10-CM | POA: Diagnosis not present

## 2018-07-27 DIAGNOSIS — Z800 Family history of malignant neoplasm of digestive organs: Secondary | ICD-10-CM | POA: Insufficient documentation

## 2018-07-27 DIAGNOSIS — D123 Benign neoplasm of transverse colon: Secondary | ICD-10-CM | POA: Insufficient documentation

## 2018-07-27 DIAGNOSIS — C787 Secondary malignant neoplasm of liver and intrahepatic bile duct: Secondary | ICD-10-CM

## 2018-07-27 DIAGNOSIS — K769 Liver disease, unspecified: Secondary | ICD-10-CM | POA: Diagnosis not present

## 2018-07-27 DIAGNOSIS — K760 Fatty (change of) liver, not elsewhere classified: Secondary | ICD-10-CM | POA: Insufficient documentation

## 2018-07-27 DIAGNOSIS — D120 Benign neoplasm of cecum: Secondary | ICD-10-CM | POA: Insufficient documentation

## 2018-07-27 DIAGNOSIS — R1010 Upper abdominal pain, unspecified: Secondary | ICD-10-CM | POA: Insufficient documentation

## 2018-07-27 DIAGNOSIS — Z79899 Other long term (current) drug therapy: Secondary | ICD-10-CM | POA: Insufficient documentation

## 2018-07-27 DIAGNOSIS — M549 Dorsalgia, unspecified: Secondary | ICD-10-CM | POA: Diagnosis not present

## 2018-07-27 DIAGNOSIS — G4730 Sleep apnea, unspecified: Secondary | ICD-10-CM | POA: Insufficient documentation

## 2018-07-27 NOTE — Progress Notes (Signed)
[facility] New Patient Consult   Requesting MD: Jonatan Wilsey 57 y.o.  Mar 16, 1961    Reason for Consult: Liver lesions suspicious for metastases on abdomen MRI   HPI: Henry Lang reports intermittent upper abdominal pain for several months.  He takes ibuprofen intermittently for this pain and back pain.  He was prescribed Protonix by Henry primary physician and this did not help.  An abdominal ultrasound on 06/16/2018 revealed fatty liver and a benign-appearing cyst.  There was a 17 mm indeterminate lesion in the right liver.  An MRI of the liver on 07/05/2018 revealed multiple small hypervascular masses with peripheral rim enhancement in the right and left liver suspicious for metastases.  The differential diagnosis includes multifocal hepatocellular carcinoma.  He was referred to Dr. Hilarie Fredrickson.  Henry Lang has a history of colon polyps.  He was taken to an upper and lower endoscopy on 07/14/2018. The upper endoscopy was unremarkable.  The colonoscopy revealed polyps in the ascending, transverse colon, and cecum. The pathology from the colon polyps revealed multiple fragments of tubular adenomas and a sessile serrated polyp.  No -grade dysplasia or malignancy.  He underwent an ultrasound-guided biopsy of a 5 mm right liver lesion on 07/19/2018.  Only one lesion was identified by ultrasound.  The pathology ([id number]) revealed benign liver with steatosis.  No evidence of malignancy.  Past Medical History:  Diagnosis Date  . Chest pain, unspecified   . Esophageal reflux   . Hepatic steatosis   . Lumbago   . Nonspecific abnormal electrocardiogram (ECG) (EKG)   . Nonspecific abnormal results of liver function study   . Other abnormal glucose   . Sleep apnea    hx .Marland Kitchen..doesnt wear mask    Past Surgical History:  Procedure Laterality Date  . EXCISION HAGLUND'S DEFORMITY WITH ACHILLES TENDON REPAIR Right 02/03/2018   Procedure: Right Haglund excision, Achilles  tendon reconstruction;  Surgeon: Wylene Simmer, MD;  Location: [place];  Service: Orthopedics;  Laterality: Right;  . GASTROC RECESSION EXTREMITY Right 02/03/2018   Procedure: GASTROC RECESSION EXTREMITY;  Surgeon: Wylene Simmer, MD;  Location: [facility];  Service: Orthopedics;  Laterality: Right;  . PALATE / UVULA BIOPSY / EXCISION  2013  . VASECTOMY  1986  . VASECTOMY REVERSAL      Medications: Reviewed  Allergies:  Allergies  Allergen Reactions  . Motrin [Ibuprofen] Hives and Other (See Comments)    Hives; can take Ibuprofen; Motrin specific    Family history: Henry mother had colon cancer and breast cancer.  Henry father had colon cancer.  No other family history of cancer  Social History:   He lives with Henry Lang in [place].  He works in Theatre manager.  He does not smoke cigarettes.  He reports social alcohol use.  No transfusion history.  No risk factor for HIV or hepatitis.  ROS:   Positives include: Intermittent upper abdominal pain  A complete ROS was otherwise negative.  Physical Exam:  Blood pressure 117/77, pulse 100, temperature 98.4 F (36.9 C), temperature source Oral, resp. rate 15, height 5\' 10"  (1.778 m), weight 252 lb 9.6 oz (114.6 kg), SpO2 100 %.  HEENT: Oropharynx without visible mass, neck without mass, bluish discoloration at the lower right eye-retina?  Versus lens Lungs: Clear bilaterally Cardiac: Regular rate and rhythm Abdomen: No mass, nontender, no hepatosplenomegaly GU: Testes without mass Vascular: No leg edema Lymph nodes: No cervical, supraclavicular, axillary, or inguinal nodes Neurologic:  Alert and oriented, the motor exam appears intact in the upper and lower extremities Skin: No rash, no suspicious skin lesions Musculoskeletal: No spine tenderness   LAB:  CBC  Lab Results  Component Value Date   WBC 5.9 07/19/2018   HGB 14.0 07/19/2018   HCT 42.9 07/19/2018   MCV 92.1 07/19/2018   PLT 231  07/19/2018   NEUTROABS 4.3 06/06/2018        CMP  Lab Results  Component Value Date   NA 139 06/06/2018   K 3.9 06/06/2018   CL 107 06/06/2018   CO2 24 06/06/2018   GLUCOSE 103 (H) 06/06/2018   BUN 13 06/06/2018   CREATININE 0.81 06/06/2018   CALCIUM 9.7 06/06/2018   PROT 7.2 06/06/2018   ALBUMIN 4.3 06/06/2018   AST 13 06/06/2018   ALT 22 06/06/2018   ALKPHOS 55 06/06/2018   BILITOT 0.5 06/06/2018   GFRNONAA 96.06 10/25/2009      Imaging:  MRI abdomen 07/05/2018- images reviewed with Henry Lang and Henry Lang   Assessment/Plan:   1. Multiple enhancing liver lesions on an MRI abdomen 07/05/2018  Ultrasound biopsy of a right liver lesion 07/19/2018-no evidence of malignancy  Negative upper endoscopy 07/14/2018  Colonoscopy 07/14/2018 with multiple benign polyps removed  2. Upper abdominal discomfort- etiology unclear 3. History of adenomatous polyps 4. Sleep apnea 5. Discoloration noted at the lower right retina or lands on exam 07/27/2018   Disposition:   Henry Lang was found to have multiple small enhancing liver lesions when he underwent evaluation for abdominal pain.  The liver lesions are suspicious for metastases.  The differential diagnosis includes multifocal hepatocellular carcinoma and a benign process.  There is no apparent primary tumor site on review of Henry history and examination today.  I discussed the case with Dr. Earleen Newport.  He reports only one lesion was visualized at the time of the ultrasound biopsy and this lesion was difficult to locate for biopsy.  He recommends a CT-guided biopsy.  I ordered this today.  We will check an AFP, CEA, and chromogranin A level.  I will refer Henry Lang for an eye exam to rule out ocular melanoma.  He will return for an office visit in approximately 2 weeks.  Betsy Coder, MD  07/27/2018, 2:41 PM

## 2018-07-27 NOTE — Telephone Encounter (Signed)
Noted  

## 2018-07-28 ENCOUNTER — Encounter: Payer: BLUE CROSS/BLUE SHIELD | Admitting: Internal Medicine

## 2018-07-28 ENCOUNTER — Telehealth: Payer: Self-pay | Admitting: *Deleted

## 2018-07-28 LAB — CEA (IN HOUSE-CHCC): CEA (CHCC-In House): 1.18 ng/mL (ref 0.00–5.00)

## 2018-07-28 LAB — AFP TUMOR MARKER: AFP, SERUM, TUMOR MARKER: 2.3 ng/mL (ref 0.0–8.3)

## 2018-07-28 NOTE — Telephone Encounter (Signed)
Wife called to follow up on referral to ophthalmology. [facility] and was given appointment with Dr. Katy Apo for 07/29/18 at 0830/0845. Records faxed [phone]) and patient notified.

## 2018-07-29 DIAGNOSIS — D134 Benign neoplasm of liver: Secondary | ICD-10-CM | POA: Diagnosis not present

## 2018-07-29 LAB — CHROMOGRANIN A: Chromogranin A: 2 nmol/L (ref 0–5)

## 2018-08-01 ENCOUNTER — Telehealth: Payer: Self-pay | Admitting: *Deleted

## 2018-08-01 NOTE — Telephone Encounter (Signed)
Wife called to inquire about CT scan C/A/P. She thought Dr. Benay Spice wanted to order this. Please call her back or [place] at [phone] with decision. Has CT biopsy on 11/21 at 0800.

## 2018-08-02 ENCOUNTER — Ambulatory Visit: Payer: BLUE CROSS/BLUE SHIELD | Admitting: Internal Medicine

## 2018-08-02 NOTE — Telephone Encounter (Signed)
Per Dr. Benay Spice : Wait for CT biopsy results. Will base decision on CT scan afterwards. Wife notified.

## 2018-08-03 ENCOUNTER — Other Ambulatory Visit: Payer: Self-pay | Admitting: Radiology

## 2018-08-04 ENCOUNTER — Other Ambulatory Visit (HOSPITAL_COMMUNITY): Payer: Self-pay | Admitting: Diagnostic Radiology

## 2018-08-04 ENCOUNTER — Encounter (HOSPITAL_COMMUNITY): Payer: Self-pay

## 2018-08-04 ENCOUNTER — Other Ambulatory Visit: Payer: Self-pay | Admitting: Oncology

## 2018-08-04 ENCOUNTER — Ambulatory Visit (HOSPITAL_COMMUNITY)
Admission: RE | Admit: 2018-08-04 | Discharge: 2018-08-04 | Disposition: A | Discharge status: Home / Self Care | Payer: BLUE CROSS/BLUE SHIELD | Source: Ambulatory Visit | Attending: Oncology | Admitting: Oncology

## 2018-08-04 DIAGNOSIS — K769 Liver disease, unspecified: Secondary | ICD-10-CM

## 2018-08-04 DIAGNOSIS — Z9889 Other specified postprocedural states: Secondary | ICD-10-CM | POA: Insufficient documentation

## 2018-08-04 DIAGNOSIS — C787 Secondary malignant neoplasm of liver and intrahepatic bile duct: Secondary | ICD-10-CM | POA: Diagnosis not present

## 2018-08-04 DIAGNOSIS — Z886 Allergy status to analgesic agent status: Secondary | ICD-10-CM | POA: Diagnosis not present

## 2018-08-04 DIAGNOSIS — K7689 Other specified diseases of liver: Secondary | ICD-10-CM | POA: Diagnosis not present

## 2018-08-04 DIAGNOSIS — Z791 Long term (current) use of non-steroidal anti-inflammatories (NSAID): Secondary | ICD-10-CM | POA: Insufficient documentation

## 2018-08-04 DIAGNOSIS — G4730 Sleep apnea, unspecified: Secondary | ICD-10-CM | POA: Diagnosis not present

## 2018-08-04 LAB — CBC
HCT: 41.9 % (ref 39.0–52.0)
Hemoglobin: 13.2 g/dL (ref 13.0–17.0)
MCH: 29 pg (ref 26.0–34.0)
MCHC: 31.5 g/dL (ref 30.0–36.0)
MCV: 92.1 fL (ref 80.0–100.0)
Platelets: 252 K/uL (ref 150–400)
RBC: 4.55 MIL/uL (ref 4.22–5.81)
RDW: 12.8 % (ref 11.5–15.5)
WBC: 5.8 K/uL (ref 4.0–10.5)
nRBC: 0 % (ref 0.0–0.2)

## 2018-08-04 LAB — PROTIME-INR
INR: 0.99
Prothrombin Time: 13 s (ref 11.4–15.2)

## 2018-08-04 MED ORDER — HYDROCODONE-ACETAMINOPHEN 5-325 MG PO TABS: 1.0000 | ORAL_TABLET | ORAL | Status: DC | PRN

## 2018-08-04 MED ORDER — FENTANYL CITRATE (PF) 100 MCG/2ML IJ SOLN
INTRAMUSCULAR | Status: AC | PRN
  Administered 2018-08-04: 50 ug via INTRAVENOUS
  Administered 2018-08-04 (×2): 25 ug via INTRAVENOUS

## 2018-08-04 MED ORDER — LIDOCAINE HCL 1 % IJ SOLN
INTRAMUSCULAR | Status: AC
  Filled 2018-08-04: qty 20

## 2018-08-04 MED ORDER — SODIUM CHLORIDE 0.9 % IV SOLN
INTRAVENOUS | Status: AC | PRN
  Administered 2018-08-04: 10 mL/h via INTRAVENOUS

## 2018-08-04 MED ORDER — MIDAZOLAM HCL 2 MG/2ML IJ SOLN
INTRAMUSCULAR | Status: AC | PRN
  Administered 2018-08-04: 0.5 mg via INTRAVENOUS
  Administered 2018-08-04: 1 mg via INTRAVENOUS
  Administered 2018-08-04: 0.5 mg via INTRAVENOUS

## 2018-08-04 MED ORDER — MIDAZOLAM HCL 2 MG/2ML IJ SOLN
INTRAMUSCULAR | Status: AC
  Filled 2018-08-04: qty 4

## 2018-08-04 MED ORDER — SODIUM CHLORIDE 0.9 % IV SOLN: INTRAVENOUS | Status: DC

## 2018-08-04 MED ORDER — FENTANYL CITRATE (PF) 100 MCG/2ML IJ SOLN
INTRAMUSCULAR | Status: AC
  Filled 2018-08-04: qty 4

## 2018-08-04 NOTE — Procedures (Signed)
US guided core biopsy of liver lesion in right hepatic lobe.  5 cores obtained.  Minimal blood loss and no immediate complication.  See full report in Imaging.

## 2018-08-04 NOTE — Progress Notes (Signed)
Spoke with Rowe Robert, PA states its ok for pt to go back to work tomorrow, but no lifting. Walking and sitting ok.Pt and pt wife informed

## 2018-08-04 NOTE — Discharge Instructions (Signed)
Liver Biopsy °The liver is a large organ in the upper right-hand side of your abdomen. A liver biopsy is a procedure in which a tissue sample is taken from the liver and examined under a microscope. The procedure is done to confirm a suspected problem. °There are three types of liver biopsies: °· Percutaneous. In this type, an incision is made in your abdomen. The sample is removed through the incision with a needle. °· Laparoscopic. In this type, several incisions are made in the abdomen. A tiny camera is passed through one of the incisions to help guide the health care provider. The sample is removed through the other incision or incisions. °· Transjugular. In this type, an incision is made in the neck. A tube is passed through the incision to the liver. The sample is removed through the tube with a needle. ° °Tell a health care provider about: °· Any allergies you have. °· All medicines you are taking, including vitamins, herbs, eye drops, creams, and over-the-counter medicines. °· Any problems you or family members have had with anesthetic medicines. °· Any blood disorders you have. °· Any surgeries you have had. °· Any medical conditions you have. °· Possibility of pregnancy, if this applies. °What are the risks? °Generally, this is a safe procedure. However, problems can occur and include: °· Bleeding. °· Infection. °· Bruising. °· Collapsed lung. °· Leak of digestive juices (bile) from the liver or gallbladder. °· Problems with heart rhythm. °· Pain at the biopsy site or in the right shoulder. °· Low blood pressure (hypotension). °· Injury to nearby organs or tissues. ° °What happens before the procedure? °· Your health care provider may do some blood or urine tests. These will help your health care provider learn how well your kidneys and liver are working and how well your blood clots. °· Ask your health care provider if you will be able to go home the day of the procedure. Arrange for someone to take you  home and stay with you for at least 24 hours. °· Do not eat or drink anything after midnight on the night before the procedure or as directed by your health care provider. °· Ask your health care provider about: °? Changing or stopping your regular medicines. This is especially important if you are taking diabetes medicines or blood thinners. °? Taking medicines such as aspirin and ibuprofen. These medicines can thin your blood. Do not take these medicines before your procedure if your health care provider asks you not to. °What happens during the procedure? °Regardless of the type of biopsy that will be done, you will have an IV line placed. Through this line, you will receive fluids and medicine to relax you. If you will be having a laparoscopic biopsy, you may also receive medicine through this line to make you sleep during the procedure (general anesthetic). °Percutaneous Liver Biopsy °· You will positioned on your back, with your right hand over your head. °· A health care provider will locate your liver by tapping and pressing on the right side of your abdomen or with the help of an ultrasound machine or CT scan. °· An area at the bottom of your last right rib will be numbed. °· An incision will be made in the numbed area. °· The biopsy needle will be inserted into the incision. °· Several samples of liver tissue will be taken with the biopsy needle. You will be asked to hold your breath as each sample is taken. °Laparoscopic Liver   Biopsy °· You will be positioned on your back. °· Several small incisions will be made in your abdomen. °· Your doctor will pass a tiny camera through one incision. The camera will allow the liver to be viewed on a TV monitor in the operating room. °· Tools will be passed through the other incision or incisions. These tools will be used to remove samples of liver tissue. °Transjugular Liver Biopsy °· You will be positioned on your back on an X-ray table, with your head turned to  your left. °· An area on your neck just over your jugular vein will be numbed. °· An incision will be made in the numbed area. °· A tiny tube will be inserted through the incision. It will be pushed through the jugular vein to a blood vessel in the liver called the hepatic vein. °· Dye will be inserted through the tube, and X-rays will be taken. The dye will make the blood vessels in the liver light up on the X-rays. °· The biopsy needle will be pushed through the tube until it reaches the liver. °· Samples of liver tissue will be taken with the biopsy needle. °· The needle and the tube will be removed. °After the samples are obtained, the incision or incisions will be closed. °What happens after the procedure? °· You will be taken to a recovery area. °· You may have to lie on your right side for 1-2 hours. This will prevent bleeding from the biopsy site. °· Your progress will be watched. Your blood pressure, pulse, and the biopsy site will be checked often. °· You may have some pain or feel sick. If this happens, tell your health care provider. °· As you begin to feel better, you will be offered ice and beverages. °· You may be allowed to go home when the medicines have worn off and you can walk, drink, eat, and use the bathroom. °This information is not intended to replace advice given to you by your health care provider. Make sure you discuss any questions you have with your health care provider. °Document Released: 11/21/2003 Document Revised: 02/03/2016 Document Reviewed: 10/27/2013 °Elsevier Interactive Patient Education © 2018 [employer] °Moderate Conscious Sedation, Adult, Care After °These instructions provide you with information about caring for yourself after your procedure. Your health care provider may also give you more specific instructions. Your treatment has been planned according to current medical practices, but problems sometimes occur. Call your health care provider if you have any problems  or questions after your procedure. °What can I expect after the procedure? °After your procedure, it is common: °· To feel sleepy for several hours. °· To feel clumsy and have poor balance for several hours. °· To have poor judgment for several hours. °· To vomit if you eat too soon. ° °Follow these instructions at home: °For at least 24 hours after the procedure: ° °· Do not: °? Participate in activities where you could fall or become injured. °? Drive. °? Use heavy machinery. °? Drink alcohol. °? Take sleeping pills or medicines that cause drowsiness. °? Make important decisions or sign legal documents. °? Take care of children on your own. °· Rest. °Eating and drinking °· Follow the diet recommended by your health care provider. °· If you vomit: °? Drink water, juice, or soup when you can drink without vomiting. °? Make sure you have little or no nausea before eating solid foods. °General instructions °· Have a responsible adult stay with you until   you are awake and alert. °· Take over-the-counter and prescription medicines only as told by your health care provider. °· If you smoke, do not smoke without supervision. °· Keep all follow-up visits as told by your health care provider. This is important. °Contact a health care provider if: °· You keep feeling nauseous or you keep vomiting. °· You feel light-headed. °· You develop a rash. °· You have a fever. °Get help right away if: °· You have trouble breathing. °This information is not intended to replace advice given to you by your health care provider. Make sure you discuss any questions you have with your health care provider. °Document Released: 06/21/2013 Document Revised: 02/03/2016 Document Reviewed: 12/21/2015 °Elsevier Interactive Patient Education © 2018 [employer]vier Inc. ° °

## 2018-08-04 NOTE — H&P (Signed)
Referring Physician(s): Ladell Pier  Supervising Physician: Markus Daft  Patient Status:  [facility] OP  Chief Complaint:  "I'm here for another liver biopsy"  Subjective: Pt familiar to IR service from prior US guided liver lesion biopsy on 07/19/18 which was negative for malignancy. He has had past hx of abdominal pain and imaging which has shown multiple small hypervascular masses in both left and right hepatic lobes/hepatic steatosis. He has no hx of cancer and underwent EGD/colonoscopy recently with multiple polyps/tubular adenomas noted but no malignancy. He presents again today for CT guided liver lesion bx. He is currently asymptomatic.   Past Medical History:  Diagnosis Date  . Chest pain, unspecified   . Esophageal reflux   . Hepatic steatosis   . Lumbago   . Nonspecific abnormal electrocardiogram (ECG) (EKG)   . Nonspecific abnormal results of liver function study   . Other abnormal glucose   . Sleep apnea    hx .Marland Kitchen..doesnt wear mask   Past Surgical History:  Procedure Laterality Date  . EXCISION HAGLUND'S DEFORMITY WITH ACHILLES TENDON REPAIR Right 02/03/2018   Procedure: Right Haglund excision, Achilles tendon reconstruction;  Surgeon: Wylene Simmer, MD;  Location: [place];  Service: Orthopedics;  Laterality: Right;  . GASTROC RECESSION EXTREMITY Right 02/03/2018   Procedure: GASTROC RECESSION EXTREMITY;  Surgeon: Wylene Simmer, MD;  Location: [place];  Service: Orthopedics;  Laterality: Right;  . PALATE / UVULA BIOPSY / EXCISION  2013  . VASECTOMY  1986  . VASECTOMY REVERSAL        Allergies: Motrin [ibuprofen]  Medications: Prior to Admission medications   Medication Sig Start Date End Date Taking? Authorizing Provider  Ibuprofen 200 MG CAPS Take 800 mg by mouth.   Yes [provider]     Vital Signs: BP 103/79   Pulse 64   Temp 97.9 F (36.6 C) (Oral)   Resp 16   Ht 5\' 11"  (1.803 m)   Wt 248 lb (112.5 kg)    SpO2 100%   BMI 34.59 kg/m   Physical Exam awake/alert; chest - CTA bilat; heart- RRR; abd- soft,+BS,NT; no LE edema  Imaging: No results found.  Labs:  CBC: Recent Labs    06/06/18 1009 07/19/18 0615  WBC 6.8 5.9  HGB 13.8 14.0  HCT 39.8 42.9  PLT 217.0 231    COAGS: Recent Labs    07/19/18 0615  INR 1.00    BMP: Recent Labs    06/06/18 1009  NA 139  K 3.9  CL 107  CO2 24  GLUCOSE 103*  BUN 13  CALCIUM 9.7  CREATININE 0.81    LIVER FUNCTION TESTS: Recent Labs    06/06/18 1009  BILITOT 0.5  AST 13  ALT 22  ALKPHOS 55  PROT 7.2  ALBUMIN 4.3    Assessment and Plan: Pt with past hx of abdominal pain and imaging which has shown multiple small hypervascular masses in both left and right hepatic lobes/hepatic steatosis. He has no hx of cancer and underwent EGD/colonoscopy recently with multiple polyps/tubular adenomas noted but no malignancy. Previous US guided liver lesion bx on 07/19/18 was negative for malignancy. He presents again today for CT guided liver lesion bx. Risks and benefits discussed with the patient/spouse including, but not limited to bleeding, infection, damage to adjacent structures or low yield requiring additional tests.  All of the patient's questions were answered, patient is agreeable to proceed. Consent signed and in chart.  LABS PENDING  Electronically Signed: D. Rowe Robert, PA-C 08/04/2018, 7:31 AM   I spent a total of 20 minutes at the the patient's bedside AND on the patient's hospital floor or unit, greater than 50% of which was counseling/coordinating care for CT guided liver lesion biopsy

## 2018-08-09 ENCOUNTER — Other Ambulatory Visit: Payer: Self-pay | Admitting: Nurse Practitioner

## 2018-08-09 DIAGNOSIS — C787 Secondary malignant neoplasm of liver and intrahepatic bile duct: Secondary | ICD-10-CM

## 2018-08-09 DIAGNOSIS — C7A00 Malignant carcinoid tumor of unspecified site: Secondary | ICD-10-CM

## 2018-08-15 ENCOUNTER — Inpatient Hospital Stay: Payer: BLUE CROSS/BLUE SHIELD | Attending: Oncology | Admitting: Oncology

## 2018-08-15 ENCOUNTER — Telehealth: Payer: Self-pay

## 2018-08-15 VITALS — BP 112/89 | HR 102 | Temp 98.5°F | Resp 17 | Ht 71.0 in | Wt 250.2 lb

## 2018-08-15 DIAGNOSIS — C7A019 Malignant carcinoid tumor of the small intestine, unspecified portion: Secondary | ICD-10-CM | POA: Diagnosis not present

## 2018-08-15 DIAGNOSIS — C7A00 Malignant carcinoid tumor of unspecified site: Secondary | ICD-10-CM | POA: Diagnosis not present

## 2018-08-15 DIAGNOSIS — G4730 Sleep apnea, unspecified: Secondary | ICD-10-CM | POA: Insufficient documentation

## 2018-08-15 DIAGNOSIS — C7B02 Secondary carcinoid tumors of liver: Secondary | ICD-10-CM

## 2018-08-15 NOTE — Telephone Encounter (Signed)
Error opening  

## 2018-08-15 NOTE — Progress Notes (Signed)
  Henry Lang OFFICE PROGRESS NOTE   Diagnosis: Carcinoid tumor  INTERVAL HISTORY:   Henry Lang returns for a scheduled visit.  He underwent an ultrasound biopsy of a right liver lesion on 08/04/2018. 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  Immune histochemical stains found the tumor cells to be positive for synaptophysin, chromogranin, and CDX-2.  The Ki-67  index returned at less than 3%. Henry Lang denies flushing and diarrhea.  He has intermittent pain in the left subcostal region.  He reports the pain occurs with certain positions.  There are no associated symptoms.  This has been present for up to a year.  No nausea or vomiting. Objective:  Vital signs in last 24 hours:  Blood pressure 112/89, pulse (!) 102, temperature 98.5 F (36.9 C), temperature source Oral, resp. rate 17, height 5' 11" (1.803 m), weight 250 lb 3.2 oz (113.5 kg), SpO2 98 %.      Resp: Lungs clear bilaterally Cardio: Regular rate and rhythm GI: Nontender, no mass, no hepatosplenomegaly Vascular: No leg edema, the left lower leg is slightly larger than the right side  Lab Results:  Lab Results  Component Value Date   WBC 5.8 08/04/2018   HGB 13.2 08/04/2018   HCT 41.9 08/04/2018   MCV 92.1 08/04/2018   PLT 252 08/04/2018   NEUTROABS 4.3 06/06/2018    CMP  Lab Results  Component Value Date   NA 139 06/06/2018   K 3.9 06/06/2018   CL 107 06/06/2018   CO2 24 06/06/2018   GLUCOSE 103 (H) 06/06/2018   BUN 13 06/06/2018   CREATININE 0.81 06/06/2018   CALCIUM 9.7 06/06/2018   PROT 7.2 06/06/2018   ALBUMIN 4.3 06/06/2018   AST 13 06/06/2018   ALT 22 06/06/2018   ALKPHOS 55 06/06/2018   BILITOT 0.5 06/06/2018   GFRNONAA 96.06 10/25/2009    Lab Results  Component Value Date   CEA1 1.18 07/27/2018     Medications: I have reviewed the patient's current medications.   Assessment/Plan: 1. Multiple enhancing liver lesions on an MRI abdomen  07/05/2018 ? Ultrasound biopsy of a right liver lesion 07/19/2018-no evidence of malignancy ? Negative upper endoscopy 07/14/2018 ? Colonoscopy 07/14/2018 with multiple benign polyps removed ? Ultrasound-guided biopsy of a right liver lesion 08/04/2018- well-differentiated neuroendocrine tumor of GI origin ? Chromogranin A level normal on 07/27/2018  2. Upper abdominal discomfort- etiology unclear 3. History of adenomatous polyps 4. Sleep apnea 5. Discoloration noted at the lower right retina or lands on exam 07/27/2018   Disposition: Henry Lang appears well.  The liver biopsy confirmed a well-differentiated neuroendocrine tumor.  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  The intermittent abdominal pain may be related to the carcinoid tumor.  I discussed the diagnosis and treatment options with Henry Lang and his wife.  He will undergo a staging Netspot study within the next 1-2 weeks.  He will return a 24-hour urine 5 HIAA.  He will be scheduled for an office visit and further discussion after the Netspot study.  Betsy Coder, MD  08/15/2018  9:35 AM

## 2018-08-15 NOTE — Telephone Encounter (Signed)
Printed avs and calender of upcoming appointment. Per 12/2 los 

## 2018-08-22 ENCOUNTER — Other Ambulatory Visit: Payer: Self-pay | Admitting: *Deleted

## 2018-08-22 DIAGNOSIS — C7A019 Malignant carcinoid tumor of the small intestine, unspecified portion: Secondary | ICD-10-CM | POA: Diagnosis not present

## 2018-08-22 DIAGNOSIS — C7A00 Malignant carcinoid tumor of unspecified site: Secondary | ICD-10-CM | POA: Diagnosis not present

## 2018-08-22 DIAGNOSIS — G4730 Sleep apnea, unspecified: Secondary | ICD-10-CM | POA: Diagnosis not present

## 2018-08-24 LAB — HM DIABETES EYE EXAM

## 2018-08-26 LAB — 5 HIAA, QUANTITATIVE, URINE, 24 HOUR
5-HIAA, Ur: 5.2 mg/L
5-HIAA,QUANT.,24 HR URINE: 5.2 mg/(24.h) (ref 0.0–14.9)
TOTAL VOLUME: 1000

## 2018-08-30 ENCOUNTER — Ambulatory Visit (HOSPITAL_COMMUNITY)
Admission: RE | Admit: 2018-08-30 | Discharge: 2018-08-30 | Disposition: A | Discharge status: Home / Self Care | Payer: BLUE CROSS/BLUE SHIELD | Source: Ambulatory Visit | Attending: Nurse Practitioner | Admitting: Nurse Practitioner

## 2018-08-30 ENCOUNTER — Telehealth: Payer: Self-pay | Admitting: *Deleted

## 2018-08-30 ENCOUNTER — Other Ambulatory Visit (HOSPITAL_COMMUNITY): Payer: BLUE CROSS/BLUE SHIELD

## 2018-08-30 DIAGNOSIS — C7A00 Malignant carcinoid tumor of unspecified site: Secondary | ICD-10-CM | POA: Diagnosis not present

## 2018-08-30 DIAGNOSIS — C7A1 Malignant poorly differentiated neuroendocrine tumors: Secondary | ICD-10-CM | POA: Diagnosis not present

## 2018-08-30 DIAGNOSIS — C787 Secondary malignant neoplasm of liver and intrahepatic bile duct: Secondary | ICD-10-CM | POA: Diagnosis not present

## 2018-08-30 MED ORDER — GALLIUM GA 68 DOTATATE IV KIT
5.5000 | PACK | Freq: Once | INTRAVENOUS | Status: AC
  Administered 2018-08-30: 5.5 via INTRAVENOUS

## 2018-08-30 NOTE — Telephone Encounter (Signed)
"  Melissa [phone]) calling for Henry Lang.  We finished lunch after today's PET scan.  He just called me saying there is swelling in his lower lip.  Throat feels okay and doesn't feel any swelling anywhere else.  Should he take benadryl?  He can be reached at [phone]."    Henry Lang, "swelling started about 30 minutes ago to only my left side of lower lip.  I thought it was a fever blister but it continued.  Never had a PET scan before or lip swelling.  No trouble breathing or swallowing.  I ate chicken, pita bread, fries with two glasses tea."  Verbal order received and read back from Dr. Benay Spice to monitor.  Take benadryl and call if progresses.  Orders given to Henry Lang at this time.  Expressed if trouble swallowing, throat swelling or trouble breathing to call 911 for evaluation.  Denies further needs or questions at this time.  Reviewed next appointment 09-01-2018 at 9:45 am.

## 2018-09-01 ENCOUNTER — Encounter: Payer: Self-pay | Admitting: Nurse Practitioner

## 2018-09-01 ENCOUNTER — Other Ambulatory Visit (HOSPITAL_COMMUNITY): Payer: BLUE CROSS/BLUE SHIELD

## 2018-09-01 ENCOUNTER — Telehealth: Payer: Self-pay

## 2018-09-01 ENCOUNTER — Inpatient Hospital Stay (HOSPITAL_BASED_OUTPATIENT_CLINIC_OR_DEPARTMENT_OTHER): Payer: BLUE CROSS/BLUE SHIELD | Admitting: Nurse Practitioner

## 2018-09-01 VITALS — BP 126/88 | HR 77 | Temp 98.1°F | Resp 17 | Ht 71.0 in | Wt 255.6 lb

## 2018-09-01 DIAGNOSIS — C7A019 Malignant carcinoid tumor of the small intestine, unspecified portion: Secondary | ICD-10-CM

## 2018-09-01 DIAGNOSIS — C7B02 Secondary carcinoid tumors of liver: Secondary | ICD-10-CM | POA: Diagnosis not present

## 2018-09-01 DIAGNOSIS — C7A00 Malignant carcinoid tumor of unspecified site: Secondary | ICD-10-CM

## 2018-09-01 DIAGNOSIS — G4730 Sleep apnea, unspecified: Secondary | ICD-10-CM

## 2018-09-01 NOTE — Telephone Encounter (Signed)
Printed avs and calender of upcoming appointment. Per 12/019 los.

## 2018-09-01 NOTE — Progress Notes (Addendum)
[facility] OFFICE PROGRESS NOTE   Diagnosis: Carcinoid tumor  INTERVAL HISTORY:   Mr. Ogan returns as scheduled.  He overall feels well.  He has occasional mid abdominal pain.  This does not occur on a daily basis.  No diarrhea.  No flushing episodes.  He has a good appetite.  Objective:  Vital signs in last 24 hours:  Blood pressure 126/88, pulse 77, temperature 98.1 F (36.7 C), temperature source Oral, resp. rate 17, height 5\' 11"  (1.803 m), weight 255 lb 9.6 oz (115.9 kg), SpO2 99 %.    HEENT: No thrush or ulcers. Resp: Lungs clear bilaterally. Cardio: Regular rate and rhythm. GI: Abdomen soft and nontender.  No hepatomegaly.  No apparent ascites. Vascular: No leg edema.    Lab Results:  Lab Results  Component Value Date   WBC 5.8 08/04/2018   HGB 13.2 08/04/2018   HCT 41.9 08/04/2018   MCV 92.1 08/04/2018   PLT 252 08/04/2018   NEUTROABS 4.3 06/06/2018    Imaging:  Nm Pet (netspot Ga 9 Dotatate) Skull Base To Mid Thigh  Result Date: 08/30/2018 CLINICAL DATA:  Well differentiated neuroendocrine tumor. EXAM: NUCLEAR MEDICINE PET SKULL BASE TO THIGH TECHNIQUE: mCi Ga 46 DOTATATE was injected intravenously. Full-ring PET imaging was performed from the skull base to thigh after the radiotracer. CT data was obtained and used for attenuation correction and anatomic localization. COMPARISON:  None. FINDINGS: NECK No radiotracer activity in neck lymph nodes. Incidental CT findings: None CHEST No radiotracer accumulation within mediastinal or hilar lymph nodes. No suspicious pulmonary nodules on the CT scan. Incidental CT finding:4 mm RIGHT lower lobe nodule on image 88/4. No associated radiotracer activity. Posterior RIGHT lower lobe nodule measuring 4 mm on image 87/4 also without metabolic activity. Radiotracer activity ABDOMEN/PELVIS Intense radiotracer activity associated with a small mesenteric/small-bowel lesion in the RIGHT abdomen. Lesion is difficult  to identify on the noncontrast CT but does localize to a 14 mm lesion adjacent the small bowel on image 147/4. The radiotracer activity is intense with SUV max equal 15.6. Small adjacent mesenteric nodule measuring 8 mm (image 42/4) also with radiotracer activity (SUV max equal 9.7). A third focus of radiotracer activity associated small bowel in the RIGHT abdomen just anterior to the ascending colon with SUV max equal 12.0. No clear CT lesion identified. Lesion found on image 152 of the fused data set. Hepatic metastasis seen on comparison MRI (07/05/2018) are not as clearly depicted on the DOTATATE PET scan. There are 2 lesions in the RIGHT hepatic lobe with radiotracer activity above background measuring approximately 1 cm each on image 101 of the fused data set. SUV max equal 14.8 and 13.8 respectively. Background liver activity is 12.3. Physiologic activity noted in the adrenal glands, spleen and kidneys. Incidental CT findings:None SKELETON No focal activity to suggest skeletal metastasis. Incidental CT findings:None IMPRESSION: 1. Small (1.5) metastatic mesentery lesion versus primary small bowel lesion in the RIGHT abdomen with intense radiotracer activity. Findings consistent well differentiated neuroendocrine tumor. 2. Small nodular mesenteric implant adjacent to the above dominant lesion. 3. Small implant along the RIGHT abdomen associated with the small bowel without CT correlation. 4. At least 2 hepatic metastasis accumulate the somatostatin specific radiotracer radiotracer. The degree of radiotracer uptake within liver lesions is less than expected for well differentiated neuroendocrine tumor. This may be in part due to high background liver activity. Electronically Signed   By: Suzy Bouchard M.D.   On: 08/30/2018 15:58  Medications: I have reviewed the patient's current medications.  Assessment/Plan: 1. Metastatic small bowel carcinoid; multiple enhancing liver lesions on an MRI abdomen  07/05/2018 ? Ultrasound biopsy of a right liver lesion 07/19/2018-no evidence of malignancy ? Negative upper endoscopy 07/14/2018 ? Colonoscopy 07/14/2018 with multiple benign polyps removed ? Ultrasound-guided biopsy of a right liver lesion 08/04/2018- well-differentiated neuroendocrine tumor of GI origin ? Chromogranin A level normal on 07/27/2018 ? 24-hour urine 5-HIAA normal on 08/22/2018 ? Netspot study 08/30/2018- small metastatic mesentery lesion versus primary small bowel lesion right abdomen with intense radiotracer activity.  Small nodular mesenteric implant adjacent to the above dominant lesion.  Small implant along the right abdomen associated with the small bowel without CT correlation.  At least 2 hepatic metastasis accumulate the somatostatin specific radiotracer.  2. Upper abdominal discomfort- etiology unclear 3. History of adenomatous polyps 4. Sleep apnea 5. Discoloration noted at the lower right retina or lands on exam 07/27/2018  Disposition: Mr. Chadderdon appears stable.  Dr. Benay Spice reviewed the Netspot report/images with Mr. Champine and his wife.  They understand the primary tumor likely originated in the small bowel and that no therapy will be curative.  He appears asymptomatic.  At present Dr. Benay Spice recommends observation with repeat imaging at a 3 to 54-month interval.  We also discussed initiating monthly Sandostatin.  We are referring him for a second opinion at [facility].  He will return for a follow-up visit in 1 month for additional discussion.  He will contact the office in the interim with any problems.  Patient seen with Dr. Benay Spice.    Ned Card ANP/GNP-BC   09/01/2018  10:17 AM  This was a shared visit with Ned Card.  We reviewed the dotatate images with Mr. Duplantis and his wife.  He appears to have a primary tumor involving the small bowel with metastatic disease involving the liver.  He is asymptomatic at present.  We discussed the natural history of  metastatic carcinoid tumors and treatment options.  I explained the disease-free survival benefit associated with Sandostatin and lanreotide.  We discussed recent data supporting the use of Lutathera.  Treatment options at present include observation versus initiating treatment with Sandostatin or lanreotide.  He request a second medical oncology opinion.  We made a referral to [facility].  He will return for an office visit in 1 month.  Julieanne Manson, MD

## 2018-09-06 ENCOUNTER — Telehealth: Payer: Self-pay | Admitting: Oncology

## 2018-09-06 NOTE — Telephone Encounter (Signed)
Faxed medical records to [facility] Dr. Jimmy Footman, release [id number]

## 2018-09-12 ENCOUNTER — Telehealth: Payer: Self-pay | Admitting: *Deleted

## 2018-09-12 NOTE — Telephone Encounter (Signed)
[facility] called to say they have scheduled an appt with Dr Jimmy Footman on 1/29 @0900 

## 2018-09-14 HISTORY — PX: COLONOSCOPY: SHX174

## 2018-09-20 DIAGNOSIS — C7A1 Malignant poorly differentiated neuroendocrine tumors: Secondary | ICD-10-CM | POA: Diagnosis not present

## 2018-09-20 DIAGNOSIS — K760 Fatty (change of) liver, not elsewhere classified: Secondary | ICD-10-CM | POA: Diagnosis not present

## 2018-09-20 DIAGNOSIS — D122 Benign neoplasm of ascending colon: Secondary | ICD-10-CM | POA: Diagnosis not present

## 2018-09-20 DIAGNOSIS — D123 Benign neoplasm of transverse colon: Secondary | ICD-10-CM | POA: Diagnosis not present

## 2018-09-21 ENCOUNTER — Telehealth: Payer: Self-pay | Admitting: *Deleted

## 2018-09-21 NOTE — Telephone Encounter (Signed)
Called to follow up on referral to Dr. Carmelia Roller at [facility] and he reports they have an appointment on 10/12/18. Needs to move his f/u with Dr. Benay Spice from 1/16 to 2nd week in February due to being out of town and wanting to be seen here after the consult 10/12/18. Scheduling message sent out.

## 2018-09-22 ENCOUNTER — Telehealth: Payer: Self-pay | Admitting: Oncology

## 2018-09-22 NOTE — Telephone Encounter (Signed)
R/s appt per 1/8 sch message - spoke with patient , he is aware of apt date and time

## 2018-09-29 ENCOUNTER — Ambulatory Visit: Payer: BLUE CROSS/BLUE SHIELD | Admitting: Oncology

## 2018-10-12 DIAGNOSIS — C7A019 Malignant carcinoid tumor of the small intestine, unspecified portion: Secondary | ICD-10-CM | POA: Diagnosis not present

## 2018-10-24 ENCOUNTER — Inpatient Hospital Stay: Payer: BLUE CROSS/BLUE SHIELD | Admitting: Oncology

## 2018-10-24 ENCOUNTER — Telehealth: Payer: Self-pay | Admitting: *Deleted

## 2018-10-24 DIAGNOSIS — R229 Localized swelling, mass and lump, unspecified: Secondary | ICD-10-CM | POA: Diagnosis not present

## 2018-10-24 DIAGNOSIS — Z79899 Other long term (current) drug therapy: Secondary | ICD-10-CM | POA: Diagnosis not present

## 2018-10-24 DIAGNOSIS — C7A00 Malignant carcinoid tumor of unspecified site: Secondary | ICD-10-CM | POA: Diagnosis not present

## 2018-10-24 DIAGNOSIS — C7B02 Secondary carcinoid tumors of liver: Secondary | ICD-10-CM | POA: Diagnosis not present

## 2018-10-24 DIAGNOSIS — R109 Unspecified abdominal pain: Secondary | ICD-10-CM | POA: Diagnosis not present

## 2018-10-24 DIAGNOSIS — C7A019 Malignant carcinoid tumor of the small intestine, unspecified portion: Secondary | ICD-10-CM | POA: Diagnosis not present

## 2018-10-24 NOTE — Telephone Encounter (Signed)
Patient was "no show" today. Per Dr. Benay Spice, he may be moving his care to [facility] since his wife works there. Left VM for wife, Lenna Sciara to inquire about their plans for oncology care.

## 2018-10-26 NOTE — Telephone Encounter (Signed)
Wife called to confirm that Henry Lang did transfer his care to [facility].

## 2018-11-07 DIAGNOSIS — C7A00 Malignant carcinoid tumor of unspecified site: Secondary | ICD-10-CM | POA: Diagnosis not present

## 2018-11-07 DIAGNOSIS — R109 Unspecified abdominal pain: Secondary | ICD-10-CM | POA: Diagnosis not present

## 2018-11-07 DIAGNOSIS — Z79899 Other long term (current) drug therapy: Secondary | ICD-10-CM | POA: Diagnosis not present

## 2018-11-07 DIAGNOSIS — R220 Localized swelling, mass and lump, head: Secondary | ICD-10-CM | POA: Diagnosis not present

## 2018-11-07 DIAGNOSIS — C7A011 Malignant carcinoid tumor of the jejunum: Secondary | ICD-10-CM | POA: Diagnosis not present

## 2018-11-08 DIAGNOSIS — C7A019 Malignant carcinoid tumor of the small intestine, unspecified portion: Secondary | ICD-10-CM | POA: Diagnosis not present

## 2018-11-21 DIAGNOSIS — C7A011 Malignant carcinoid tumor of the jejunum: Secondary | ICD-10-CM | POA: Diagnosis not present

## 2018-11-21 DIAGNOSIS — R220 Localized swelling, mass and lump, head: Secondary | ICD-10-CM | POA: Diagnosis not present

## 2018-11-21 DIAGNOSIS — R109 Unspecified abdominal pain: Secondary | ICD-10-CM | POA: Diagnosis not present

## 2018-11-21 DIAGNOSIS — C7A019 Malignant carcinoid tumor of the small intestine, unspecified portion: Secondary | ICD-10-CM | POA: Diagnosis not present

## 2018-11-21 DIAGNOSIS — C7A098 Malignant carcinoid tumors of other sites: Secondary | ICD-10-CM | POA: Diagnosis not present

## 2018-11-29 DIAGNOSIS — C7A011 Malignant carcinoid tumor of the jejunum: Secondary | ICD-10-CM | POA: Diagnosis not present

## 2018-12-05 DIAGNOSIS — G4733 Obstructive sleep apnea (adult) (pediatric): Secondary | ICD-10-CM | POA: Diagnosis not present

## 2018-12-05 DIAGNOSIS — C7A8 Other malignant neuroendocrine tumors: Secondary | ICD-10-CM | POA: Diagnosis not present

## 2018-12-05 DIAGNOSIS — G893 Neoplasm related pain (acute) (chronic): Secondary | ICD-10-CM | POA: Diagnosis not present

## 2018-12-05 DIAGNOSIS — K6389 Other specified diseases of intestine: Secondary | ICD-10-CM | POA: Diagnosis not present

## 2018-12-05 DIAGNOSIS — R1084 Generalized abdominal pain: Secondary | ICD-10-CM | POA: Diagnosis not present

## 2018-12-05 DIAGNOSIS — Z800 Family history of malignant neoplasm of digestive organs: Secondary | ICD-10-CM | POA: Diagnosis not present

## 2018-12-05 DIAGNOSIS — K567 Ileus, unspecified: Secondary | ICD-10-CM | POA: Diagnosis not present

## 2018-12-05 DIAGNOSIS — C7A011 Malignant carcinoid tumor of the jejunum: Secondary | ICD-10-CM | POA: Diagnosis not present

## 2018-12-05 DIAGNOSIS — K8010 Calculus of gallbladder with chronic cholecystitis without obstruction: Secondary | ICD-10-CM | POA: Diagnosis not present

## 2018-12-05 DIAGNOSIS — R7303 Prediabetes: Secondary | ICD-10-CM | POA: Diagnosis not present

## 2018-12-05 DIAGNOSIS — Z9049 Acquired absence of other specified parts of digestive tract: Secondary | ICD-10-CM | POA: Diagnosis not present

## 2018-12-05 DIAGNOSIS — K560 Paralytic ileus: Secondary | ICD-10-CM | POA: Diagnosis not present

## 2018-12-05 DIAGNOSIS — C7A019 Malignant carcinoid tumor of the small intestine, unspecified portion: Secondary | ICD-10-CM | POA: Diagnosis not present

## 2018-12-05 DIAGNOSIS — C7B01 Secondary carcinoid tumors of distant lymph nodes: Secondary | ICD-10-CM | POA: Diagnosis not present

## 2018-12-05 DIAGNOSIS — R1013 Epigastric pain: Secondary | ICD-10-CM | POA: Diagnosis not present

## 2018-12-05 DIAGNOSIS — Z79899 Other long term (current) drug therapy: Secondary | ICD-10-CM | POA: Diagnosis not present

## 2018-12-05 DIAGNOSIS — R109 Unspecified abdominal pain: Secondary | ICD-10-CM | POA: Diagnosis not present

## 2018-12-05 DIAGNOSIS — G8918 Other acute postprocedural pain: Secondary | ICD-10-CM | POA: Diagnosis not present

## 2018-12-05 DIAGNOSIS — C787 Secondary malignant neoplasm of liver and intrahepatic bile duct: Secondary | ICD-10-CM | POA: Diagnosis not present

## 2018-12-05 DIAGNOSIS — K219 Gastro-esophageal reflux disease without esophagitis: Secondary | ICD-10-CM | POA: Diagnosis not present

## 2018-12-05 DIAGNOSIS — R110 Nausea: Secondary | ICD-10-CM | POA: Diagnosis not present

## 2018-12-05 DIAGNOSIS — Z4659 Encounter for fitting and adjustment of other gastrointestinal appliance and device: Secondary | ICD-10-CM | POA: Diagnosis not present

## 2018-12-05 DIAGNOSIS — K5900 Constipation, unspecified: Secondary | ICD-10-CM | POA: Diagnosis not present

## 2018-12-05 DIAGNOSIS — K56699 Other intestinal obstruction unspecified as to partial versus complete obstruction: Secondary | ICD-10-CM | POA: Diagnosis not present

## 2018-12-05 DIAGNOSIS — C7B8 Other secondary neuroendocrine tumors: Secondary | ICD-10-CM | POA: Diagnosis not present

## 2018-12-05 DIAGNOSIS — C179 Malignant neoplasm of small intestine, unspecified: Secondary | ICD-10-CM | POA: Diagnosis not present

## 2018-12-05 DIAGNOSIS — K56609 Unspecified intestinal obstruction, unspecified as to partial versus complete obstruction: Secondary | ICD-10-CM | POA: Diagnosis not present

## 2018-12-08 DIAGNOSIS — C7A011 Malignant carcinoid tumor of the jejunum: Secondary | ICD-10-CM | POA: Diagnosis not present

## 2018-12-08 DIAGNOSIS — C7B01 Secondary carcinoid tumors of distant lymph nodes: Secondary | ICD-10-CM | POA: Diagnosis not present

## 2018-12-08 DIAGNOSIS — K8010 Calculus of gallbladder with chronic cholecystitis without obstruction: Secondary | ICD-10-CM | POA: Diagnosis not present

## 2018-12-08 DIAGNOSIS — K56609 Unspecified intestinal obstruction, unspecified as to partial versus complete obstruction: Secondary | ICD-10-CM | POA: Diagnosis not present

## 2018-12-08 DIAGNOSIS — K56699 Other intestinal obstruction unspecified as to partial versus complete obstruction: Secondary | ICD-10-CM | POA: Diagnosis not present

## 2018-12-08 DIAGNOSIS — C7B8 Other secondary neuroendocrine tumors: Secondary | ICD-10-CM | POA: Diagnosis not present

## 2018-12-08 DIAGNOSIS — G8918 Other acute postprocedural pain: Secondary | ICD-10-CM | POA: Diagnosis not present

## 2018-12-08 DIAGNOSIS — C787 Secondary malignant neoplasm of liver and intrahepatic bile duct: Secondary | ICD-10-CM | POA: Diagnosis not present

## 2018-12-08 DIAGNOSIS — C7A8 Other malignant neuroendocrine tumors: Secondary | ICD-10-CM | POA: Diagnosis not present

## 2018-12-08 DIAGNOSIS — C179 Malignant neoplasm of small intestine, unspecified: Secondary | ICD-10-CM | POA: Diagnosis not present

## 2018-12-08 DIAGNOSIS — C7A019 Malignant carcinoid tumor of the small intestine, unspecified portion: Secondary | ICD-10-CM | POA: Diagnosis not present

## 2018-12-08 HISTORY — PX: CHOLECYSTECTOMY: SHX55

## 2018-12-08 HISTORY — PX: SMALL INTESTINE SURGERY: SHX150

## 2018-12-09 DIAGNOSIS — C787 Secondary malignant neoplasm of liver and intrahepatic bile duct: Secondary | ICD-10-CM | POA: Diagnosis not present

## 2018-12-09 DIAGNOSIS — C179 Malignant neoplasm of small intestine, unspecified: Secondary | ICD-10-CM | POA: Diagnosis not present

## 2018-12-09 DIAGNOSIS — R109 Unspecified abdominal pain: Secondary | ICD-10-CM | POA: Diagnosis not present

## 2018-12-10 DIAGNOSIS — C179 Malignant neoplasm of small intestine, unspecified: Secondary | ICD-10-CM | POA: Diagnosis not present

## 2018-12-10 DIAGNOSIS — R109 Unspecified abdominal pain: Secondary | ICD-10-CM | POA: Diagnosis not present

## 2018-12-10 DIAGNOSIS — C787 Secondary malignant neoplasm of liver and intrahepatic bile duct: Secondary | ICD-10-CM | POA: Diagnosis not present

## 2018-12-11 DIAGNOSIS — G8918 Other acute postprocedural pain: Secondary | ICD-10-CM | POA: Diagnosis not present

## 2018-12-11 DIAGNOSIS — R1084 Generalized abdominal pain: Secondary | ICD-10-CM | POA: Diagnosis not present

## 2018-12-11 DIAGNOSIS — R1013 Epigastric pain: Secondary | ICD-10-CM | POA: Diagnosis not present

## 2018-12-11 DIAGNOSIS — C7A8 Other malignant neuroendocrine tumors: Secondary | ICD-10-CM | POA: Diagnosis not present

## 2018-12-12 DIAGNOSIS — Z79899 Other long term (current) drug therapy: Secondary | ICD-10-CM | POA: Diagnosis not present

## 2018-12-12 DIAGNOSIS — R109 Unspecified abdominal pain: Secondary | ICD-10-CM | POA: Diagnosis not present

## 2018-12-12 DIAGNOSIS — K6389 Other specified diseases of intestine: Secondary | ICD-10-CM | POA: Diagnosis not present

## 2018-12-12 DIAGNOSIS — Z4659 Encounter for fitting and adjustment of other gastrointestinal appliance and device: Secondary | ICD-10-CM | POA: Diagnosis not present

## 2018-12-16 DIAGNOSIS — R1084 Generalized abdominal pain: Secondary | ICD-10-CM | POA: Diagnosis not present

## 2018-12-16 DIAGNOSIS — C7A011 Malignant carcinoid tumor of the jejunum: Secondary | ICD-10-CM | POA: Diagnosis not present

## 2018-12-16 DIAGNOSIS — K560 Paralytic ileus: Secondary | ICD-10-CM | POA: Diagnosis not present

## 2018-12-16 DIAGNOSIS — Z9049 Acquired absence of other specified parts of digestive tract: Secondary | ICD-10-CM | POA: Diagnosis not present

## 2018-12-21 MED ORDER — ACETAMINOPHEN 650 MG/20.3ML PO SOLN
650.00 | ORAL | Status: DC
Start: 2018-12-21 — End: 2018-12-21

## 2018-12-21 MED ORDER — DEXTROSE 10 % IV SOLN
125.00 | INTRAVENOUS | Status: DC
Start: ? — End: 2018-12-21

## 2018-12-21 MED ORDER — ENOXAPARIN SODIUM 40 MG/0.4ML ~~LOC~~ SOLN
40.00 | SUBCUTANEOUS | Status: DC
Start: 2018-12-21 — End: 2018-12-21

## 2018-12-21 MED ORDER — OXYCODONE HCL 5 MG/5ML PO SOLN
5.00 | ORAL | Status: DC
Start: ? — End: 2018-12-21

## 2018-12-21 MED ORDER — INSULIN LISPRO 100 UNIT/ML ~~LOC~~ SOLN
2.00 | SUBCUTANEOUS | Status: DC
Start: 2018-12-21 — End: 2018-12-21

## 2018-12-21 MED ORDER — ONDANSETRON HCL 4 MG/2ML IJ SOLN
4.00 | INTRAMUSCULAR | Status: DC
Start: ? — End: 2018-12-21

## 2018-12-21 MED ORDER — SODIUM CHLORIDE FLUSH 0.9 % IV SOLN
20.00 | INTRAVENOUS | Status: DC
Start: ? — End: 2018-12-21

## 2018-12-21 MED ORDER — SODIUM CHLORIDE FLUSH 0.9 % IV SOLN
20.00 | INTRAVENOUS | Status: DC
Start: 2018-12-22 — End: 2018-12-21

## 2019-01-04 DIAGNOSIS — C7A019 Malignant carcinoid tumor of the small intestine, unspecified portion: Secondary | ICD-10-CM | POA: Diagnosis not present

## 2019-01-04 DIAGNOSIS — R220 Localized swelling, mass and lump, head: Secondary | ICD-10-CM | POA: Diagnosis not present

## 2019-01-04 DIAGNOSIS — Z79899 Other long term (current) drug therapy: Secondary | ICD-10-CM | POA: Diagnosis not present

## 2019-01-04 DIAGNOSIS — R739 Hyperglycemia, unspecified: Secondary | ICD-10-CM | POA: Diagnosis not present

## 2019-01-04 DIAGNOSIS — C7A098 Malignant carcinoid tumors of other sites: Secondary | ICD-10-CM | POA: Diagnosis not present

## 2019-01-04 DIAGNOSIS — F1020 Alcohol dependence, uncomplicated: Secondary | ICD-10-CM | POA: Diagnosis not present

## 2019-01-04 DIAGNOSIS — Z5111 Encounter for antineoplastic chemotherapy: Secondary | ICD-10-CM | POA: Diagnosis not present

## 2019-02-01 DIAGNOSIS — Z79899 Other long term (current) drug therapy: Secondary | ICD-10-CM | POA: Diagnosis not present

## 2019-02-01 DIAGNOSIS — C7A098 Malignant carcinoid tumors of other sites: Secondary | ICD-10-CM | POA: Diagnosis not present

## 2019-03-01 DIAGNOSIS — Z79899 Other long term (current) drug therapy: Secondary | ICD-10-CM | POA: Diagnosis not present

## 2019-03-01 DIAGNOSIS — C7A098 Malignant carcinoid tumors of other sites: Secondary | ICD-10-CM | POA: Diagnosis not present

## 2019-03-29 DIAGNOSIS — C7A098 Malignant carcinoid tumors of other sites: Secondary | ICD-10-CM | POA: Diagnosis not present

## 2019-03-29 DIAGNOSIS — R739 Hyperglycemia, unspecified: Secondary | ICD-10-CM | POA: Diagnosis not present

## 2019-03-29 DIAGNOSIS — G44021 Chronic cluster headache, intractable: Secondary | ICD-10-CM | POA: Diagnosis not present

## 2019-03-29 DIAGNOSIS — C7A019 Malignant carcinoid tumor of the small intestine, unspecified portion: Secondary | ICD-10-CM | POA: Diagnosis not present

## 2019-03-29 DIAGNOSIS — R51 Headache: Secondary | ICD-10-CM | POA: Diagnosis not present

## 2019-03-29 DIAGNOSIS — Z79899 Other long term (current) drug therapy: Secondary | ICD-10-CM | POA: Diagnosis not present

## 2019-03-29 DIAGNOSIS — F1020 Alcohol dependence, uncomplicated: Secondary | ICD-10-CM | POA: Diagnosis not present

## 2019-04-26 DIAGNOSIS — Z79899 Other long term (current) drug therapy: Secondary | ICD-10-CM | POA: Diagnosis not present

## 2019-04-26 DIAGNOSIS — C7A098 Malignant carcinoid tumors of other sites: Secondary | ICD-10-CM | POA: Diagnosis not present

## 2019-05-19 DIAGNOSIS — C7A019 Malignant carcinoid tumor of the small intestine, unspecified portion: Secondary | ICD-10-CM | POA: Diagnosis not present

## 2019-05-19 DIAGNOSIS — R51 Headache: Secondary | ICD-10-CM | POA: Diagnosis not present

## 2019-05-19 DIAGNOSIS — C7B8 Other secondary neuroendocrine tumors: Secondary | ICD-10-CM | POA: Diagnosis not present

## 2019-05-19 DIAGNOSIS — R918 Other nonspecific abnormal finding of lung field: Secondary | ICD-10-CM | POA: Diagnosis not present

## 2019-05-23 LAB — BASIC METABOLIC PANEL WITH GFR
BUN: 13 (ref 4–21)
Creatinine: 0.8 (ref 0.6–1.3)
Potassium: 3.7 (ref 3.4–5.3)
Sodium: 138 (ref 137–147)

## 2019-05-23 LAB — HEPATIC FUNCTION PANEL
AST: 32 (ref 14–40)
Alkaline Phosphatase: 70 (ref 25–125)

## 2019-05-24 DIAGNOSIS — R739 Hyperglycemia, unspecified: Secondary | ICD-10-CM | POA: Diagnosis not present

## 2019-05-24 DIAGNOSIS — D3A098 Benign carcinoid tumors of other sites: Secondary | ICD-10-CM | POA: Diagnosis not present

## 2019-05-24 DIAGNOSIS — Z801 Family history of malignant neoplasm of trachea, bronchus and lung: Secondary | ICD-10-CM | POA: Diagnosis not present

## 2019-05-24 DIAGNOSIS — F1020 Alcohol dependence, uncomplicated: Secondary | ICD-10-CM | POA: Diagnosis not present

## 2019-05-24 DIAGNOSIS — C787 Secondary malignant neoplasm of liver and intrahepatic bile duct: Secondary | ICD-10-CM | POA: Diagnosis not present

## 2019-05-24 DIAGNOSIS — Z800 Family history of malignant neoplasm of digestive organs: Secondary | ICD-10-CM | POA: Diagnosis not present

## 2019-05-24 DIAGNOSIS — Z79899 Other long term (current) drug therapy: Secondary | ICD-10-CM | POA: Diagnosis not present

## 2019-05-24 DIAGNOSIS — C7A011 Malignant carcinoid tumor of the jejunum: Secondary | ICD-10-CM | POA: Diagnosis not present

## 2019-05-24 DIAGNOSIS — Z803 Family history of malignant neoplasm of breast: Secondary | ICD-10-CM | POA: Diagnosis not present

## 2019-05-24 DIAGNOSIS — R918 Other nonspecific abnormal finding of lung field: Secondary | ICD-10-CM | POA: Diagnosis not present

## 2019-05-24 DIAGNOSIS — C7A019 Malignant carcinoid tumor of the small intestine, unspecified portion: Secondary | ICD-10-CM | POA: Diagnosis not present

## 2019-06-05 ENCOUNTER — Encounter: Payer: Self-pay | Admitting: Internal Medicine

## 2019-06-05 ENCOUNTER — Ambulatory Visit (INDEPENDENT_AMBULATORY_CARE_PROVIDER_SITE_OTHER): Payer: BC Managed Care – PPO | Admitting: Internal Medicine

## 2019-06-05 ENCOUNTER — Other Ambulatory Visit (INDEPENDENT_AMBULATORY_CARE_PROVIDER_SITE_OTHER): Payer: BC Managed Care – PPO

## 2019-06-05 ENCOUNTER — Other Ambulatory Visit: Payer: Self-pay

## 2019-06-05 VITALS — BP 154/92 | HR 64 | Temp 98.2°F | Resp 16 | Ht 71.0 in | Wt 252.0 lb

## 2019-06-05 DIAGNOSIS — R739 Hyperglycemia, unspecified: Secondary | ICD-10-CM

## 2019-06-05 DIAGNOSIS — Z0000 Encounter for general adult medical examination without abnormal findings: Secondary | ICD-10-CM

## 2019-06-05 DIAGNOSIS — E785 Hyperlipidemia, unspecified: Secondary | ICD-10-CM | POA: Diagnosis not present

## 2019-06-05 DIAGNOSIS — I10 Essential (primary) hypertension: Secondary | ICD-10-CM

## 2019-06-05 DIAGNOSIS — E118 Type 2 diabetes mellitus with unspecified complications: Secondary | ICD-10-CM | POA: Diagnosis not present

## 2019-06-05 LAB — LIPID PANEL
Cholesterol: 123 mg/dL (ref 0–200)
HDL: 32.6 mg/dL — ABNORMAL LOW (ref >39.00)
LDL Cholesterol: 62 mg/dL (ref 0–99)
NonHDL: 90.29
Total CHOL/HDL Ratio: 4
Triglycerides: 142 mg/dL (ref 0.0–149.0)
VLDL: 28.4 mg/dL (ref 0.0–40.0)

## 2019-06-05 LAB — URINALYSIS, ROUTINE W REFLEX MICROSCOPIC
Bilirubin Urine: NEGATIVE
Hgb urine dipstick: NEGATIVE
Leukocytes,Ua: NEGATIVE
Nitrite: NEGATIVE
RBC / HPF: NONE SEEN (ref 0–?)
Specific Gravity, Urine: 1.025 (ref 1.000–1.030)
Total Protein, Urine: NEGATIVE
Urine Glucose: >=1000 — AB
Urobilinogen, UA: 1 (ref 0.0–1.0)
WBC, UA: NONE SEEN (ref 0–?)
pH: 6 (ref 5.0–8.0)

## 2019-06-05 LAB — POCT GLYCOSYLATED HEMOGLOBIN (HGB A1C): Hemoglobin A1C: 8.5 % — AB (ref 4.0–5.6)

## 2019-06-05 LAB — MICROALBUMIN / CREATININE URINE RATIO
Creatinine,U: 115.8 mg/dL
Microalb Creat Ratio: 0.6 mg/g (ref 0.0–30.0)
Microalb, Ur: 0.7 mg/dL (ref 0.0–1.9)

## 2019-06-05 LAB — TSH: TSH: 2.74 u[IU]/mL (ref 0.35–4.50)

## 2019-06-05 LAB — C-PEPTIDE: C-Peptide: 2.21 ng/mL (ref 0.80–3.85)

## 2019-06-05 LAB — POCT GLUCOSE (DEVICE FOR HOME USE): Glucose Fasting, POC: 201 mg/dL — AB (ref 70–99)

## 2019-06-05 LAB — PSA: PSA: 0.47 ng/mL (ref 0.10–4.00)

## 2019-06-05 MED ORDER — NOVOFINE 32G X 6 MM MISC: 1.0000 | Freq: Every day | 3 refills | Status: DC

## 2019-06-05 MED ORDER — CONTOUR NEXT ONE KIT: 1.0000 | PACK | Freq: Two times a day (BID) | 0 refills | Status: DC

## 2019-06-05 MED ORDER — SYNJARDY XR 5-1000 MG PO TB24: 2.0000 | ORAL_TABLET | Freq: Every day | ORAL | 0 refills | Status: DC

## 2019-06-05 MED ORDER — ROSUVASTATIN CALCIUM 5 MG PO TABS: 5.0000 mg | ORAL_TABLET | Freq: Every day | ORAL | 1 refills | Status: DC

## 2019-06-05 MED ORDER — TRESIBA FLEXTOUCH 100 UNIT/ML ~~LOC~~ SOPN: 20.0000 [IU] | PEN_INJECTOR | Freq: Every day | SUBCUTANEOUS | 1 refills | Status: DC

## 2019-06-05 NOTE — Patient Instructions (Signed)
Type 2 Diabetes Mellitus, Diagnosis, Adult Type 2 diabetes (type 2 diabetes mellitus) is a long-term (chronic) disease. In type 2 diabetes, one or both of these problems may be present:  The pancreas does not make enough of a hormone called insulin.  Cells in the body do not respond properly to insulin that the body makes (insulin resistance). Normally, insulin allows blood sugar (glucose) to enter cells in the body. The cells use glucose for energy. Insulin resistance or lack of insulin causes excess glucose to build up in the blood instead of going into cells. As a result, high blood glucose (hyperglycemia) develops. What increases the risk? The following factors may make you more likely to develop type 2 diabetes:  Having a family member with type 2 diabetes.  Being overweight or obese.  Having an inactive (sedentary) lifestyle.  Having been diagnosed with insulin resistance.  Having a history of prediabetes, gestational diabetes, or polycystic ovary syndrome (PCOS).  Being of American-Indian, African-American, Hispanic/Latino, or Asian/Pacific Islander descent. What are the signs or symptoms? In the early stage of this condition, you may not have symptoms. Symptoms develop slowly and may include:  Increased thirst (polydipsia).  Increased hunger(polyphagia).  Increased urination (polyuria).  Increased urination during the night (nocturia).  Unexplained weight loss.  Frequent infections that keep coming back (recurring).  Fatigue.  Weakness.  Vision changes, such as blurry vision.  Cuts or bruises that are slow to heal.  Tingling or numbness in the hands or feet.  Dark patches on the skin (acanthosis nigricans). How is this diagnosed? This condition is diagnosed based on your symptoms, your medical history, a physical exam, and your blood glucose level. Your blood glucose may be checked with one or more of the following blood tests:  A fasting blood glucose (FBG)  test. You will not be allowed to eat (you will fast) for 8 hours or longer before a blood sample is taken.  A random blood glucose test. This test checks blood glucose at any time of day regardless of when you ate.  An A1c (hemoglobin A1c) blood test. This test provides information about blood glucose control over the previous 2-3 months.  An oral glucose tolerance test (OGTT). This test measures your blood glucose at two times: ? After fasting. This is your baseline blood glucose level. ? Two hours after drinking a beverage that contains glucose. You may be diagnosed with type 2 diabetes if:  Your FBG level is 126 mg/dL (7.0 mmol/L) or higher.  Your random blood glucose level is 200 mg/dL (11.1 mmol/L) or higher.  Your A1c level is 6.5% or higher.  Your OGTT result is higher than 200 mg/dL (11.1 mmol/L). These blood tests may be repeated to confirm your diagnosis. How is this treated? Your treatment may be managed by a specialist called an endocrinologist. Type 2 diabetes may be treated by following instructions from your health care provider about:  Making diet and lifestyle changes. This may include: ? Following an individualized nutrition plan that is developed by a diet and nutrition specialist (registered dietitian). ? Exercising regularly. ? Finding ways to manage stress.  Checking your blood glucose level as often as told.  Taking diabetes medicines or insulin daily. This helps to keep your blood glucose levels in the healthy range. ? If you use insulin, you may need to adjust the dosage depending on how physically active you are and what foods you eat. Your health care provider will tell you how to adjust your dosage.    Taking medicines to help prevent complications from diabetes, such as: ? Aspirin. ? Medicine to lower cholesterol. ? Medicine to control blood pressure. Your health care provider will set individualized treatment goals for you. Your goals will be based on  your age, other medical conditions you have, and how you respond to diabetes treatment. Generally, the goal of treatment is to maintain the following blood glucose levels:  Before meals (preprandial): 80-130 mg/dL (4.4-7.2 mmol/L).  After meals (postprandial): below 180 mg/dL (10 mmol/L).  A1c level: less than 7%. Follow these instructions at home: Questions to ask your health care provider  Consider asking the following questions: ? Do I need to meet with a diabetes educator? ? Where can I find a support group for people with diabetes? ? What equipment will I need to manage my diabetes at home? ? What diabetes medicines do I need, and when should I take them? ? How often do I need to check my blood glucose? ? What number can I call if I have questions? ? When is my next appointment? General instructions  Take over-the-counter and prescription medicines only as told by your health care provider.  Keep all follow-up visits as told by your health care provider. This is important.  For more information about diabetes, visit: ? American Diabetes Association (ADA): www.diabetes.org ? American Association of Diabetes Educators (AADE): www.diabeteseducator.org Contact a health care provider if:  Your blood glucose is at or above 240 mg/dL (13.3 mmol/L) for 2 days in a row.  You have been sick or have had a fever for 2 days or longer, and you are not getting better.  You have any of the following problems for more than 6 hours: ? You cannot eat or drink. ? You have nausea and vomiting. ? You have diarrhea. Get help right away if:  Your blood glucose is lower than 54 mg/dL (3.0 mmol/L).  You become confused or you have trouble thinking clearly.  You have difficulty breathing.  You have moderate or large ketone levels in your urine. Summary  Type 2 diabetes (type 2 diabetes mellitus) is a long-term (chronic) disease. In type 2 diabetes, the pancreas does not make enough of a  hormone called insulin, or cells in the body do not respond properly to insulin that the body makes (insulin resistance).  This condition is treated by making diet and lifestyle changes and taking diabetes medicines or insulin.  Your health care provider will set individualized treatment goals for you. Your goals will be based on your age, other medical conditions you have, and how you respond to diabetes treatment.  Keep all follow-up visits as told by your health care provider. This is important. This information is not intended to replace advice given to you by your health care provider. Make sure you discuss any questions you have with your health care provider. Document Released: 08/31/2005 Document Revised: 10/29/2017 Document Reviewed: 10/04/2015 Elsevier Patient Education  2020 Elsevier Inc.  

## 2019-06-05 NOTE — Progress Notes (Signed)
Subjective:  Patient ID: Henry Lang, male    DOB: April 20, 1961  Age: 58 y.o. MRN: [id number]  CC: Annual Exam, Diabetes, Hyperlipidemia, and Hypertension   HPI Henry Lang presents for a CPX.  He is being treated for a neuroendocrine tumor in his small bowel.  He is having a good response to lanreotide but he has been told that his blood sugar has recently been elevated.  He denies polys.  He is active and denies any recent episodes of CP, DOE, palpitations, edema, or fatigue.  Outpatient Medications Prior to Visit  Medication Sig Dispense Refill  . Ibuprofen 200 MG CAPS Take 800 mg by mouth.     No facility-administered medications prior to visit.     ROS Review of Systems  Constitutional: Negative for diaphoresis, fatigue and unexpected weight change.  Eyes: Negative for visual disturbance.  Respiratory: Negative for cough, chest tightness, shortness of breath and wheezing.   Cardiovascular: Negative for chest pain, palpitations and leg swelling.  Gastrointestinal: Negative for abdominal pain, constipation, diarrhea, nausea and vomiting.  Endocrine: Negative.   Genitourinary: Negative.  Negative for difficulty urinating.  Musculoskeletal: Negative.  Negative for arthralgias.  Skin: Negative.   Neurological: Negative.  Negative for dizziness, weakness and light-headedness.  Hematological: Negative for adenopathy. Does not bruise/bleed easily.  Psychiatric/Behavioral: Negative.     Objective:  BP (!) 154/92 (BP Location: Left Arm, Patient Position: Sitting, Cuff Size: Large)   Pulse 64   Temp 98.2 F (36.8 C) (Oral)   Resp 16   Ht '5\' 11"'  (1.803 m)   Wt 252 lb (114.3 kg)   SpO2 98%   BMI 35.15 kg/m   BP Readings from Last 3 Encounters:  06/05/19 (!) 154/92  09/01/18 126/88  08/15/18 112/89    Wt Readings from Last 3 Encounters:  06/05/19 252 lb (114.3 kg)  09/01/18 255 lb 9.6 oz (115.9 kg)  08/15/18 250 lb 3.2 oz (113.5 kg)    Physical Exam Vitals signs  reviewed.  Constitutional:      Appearance: He is obese. He is not ill-appearing or diaphoretic.  HENT:     Nose: Nose normal.     Mouth/Throat:     Mouth: Mucous membranes are moist.     Pharynx: No oropharyngeal exudate.  Eyes:     General: No scleral icterus.    Conjunctiva/sclera: Conjunctivae normal.  Neck:     Musculoskeletal: Normal range of motion. No muscular tenderness.  Cardiovascular:     Rate and Rhythm: Regular rhythm. Bradycardia present.     Heart sounds: No murmur.     Comments: EKG ---  Sinus  Bradycardia  WITHIN NORMAL LIMITS Pulmonary:     Effort: Pulmonary effort is normal.     Breath sounds: No stridor. No wheezing, rhonchi or rales.  Abdominal:     General: Abdomen is protuberant. Bowel sounds are normal. There is no distension.     Palpations: There is no hepatomegaly, splenomegaly or mass.     Tenderness: There is no abdominal tenderness.     Hernia: No hernia is present. There is no hernia in the left inguinal area or right inguinal area.  Genitourinary:    Pubic Area: No rash.      Penis: Normal. No swelling or lesions.      Scrotum/Testes: Normal.        Right: Mass, tenderness or swelling not present.        Left: Mass, tenderness or swelling not present.  Epididymis:     Right: Normal. Not inflamed or enlarged. No mass.     Left: Normal. Not enlarged. No mass.     Prostate: Normal. Not enlarged, not tender and no nodules present.     Rectum: Normal. Guaiac result negative. No mass, tenderness, anal fissure, external hemorrhoid or internal hemorrhoid. Normal anal tone.  Musculoskeletal:     Right lower leg: No edema.     Left lower leg: No edema.  Lymphadenopathy:     Cervical: No cervical adenopathy.     Lower Body: No right inguinal adenopathy. No left inguinal adenopathy.  Neurological:     Mental Status: He is alert.     Lab Results  Component Value Date   WBC 5.8 08/04/2018   HGB 13.2 08/04/2018   HCT 41.9 08/04/2018   PLT  252 08/04/2018   GLUCOSE 103 (H) 06/06/2018   CHOL 123 06/05/2019   TRIG 142.0 06/05/2019   HDL 32.60 (L) 06/05/2019   LDLCALC 62 06/05/2019   ALT 22 06/06/2018   AST 32 05/23/2019   NA 138 05/23/2019   K 3.7 05/23/2019   CL 107 06/06/2018   CREATININE 0.8 05/23/2019   BUN 13 05/23/2019   CO2 24 06/06/2018   TSH 2.74 06/05/2019   PSA 0.47 06/05/2019   INR 0.99 08/04/2018   HGBA1C 8.5 (A) 06/05/2019   MICROALBUR 0.7 06/05/2019    Nm Pet (netspot Ga 31 Dotatate) Skull Base To Mid Thigh  Result Date: 08/30/2018 CLINICAL DATA:  Well differentiated neuroendocrine tumor. EXAM: NUCLEAR MEDICINE PET SKULL BASE TO THIGH TECHNIQUE: mCi Ga 97 DOTATATE was injected intravenously. Full-ring PET imaging was performed from the skull base to thigh after the radiotracer. CT data was obtained and used for attenuation correction and anatomic localization. COMPARISON:  None. FINDINGS: NECK No radiotracer activity in neck lymph nodes. Incidental CT findings: None CHEST No radiotracer accumulation within mediastinal or hilar lymph nodes. No suspicious pulmonary nodules on the CT scan. Incidental CT finding:4 mm RIGHT lower lobe nodule on image 88/4. No associated radiotracer activity. Posterior RIGHT lower lobe nodule measuring 4 mm on image 87/4 also without metabolic activity. Radiotracer activity ABDOMEN/PELVIS Intense radiotracer activity associated with a small mesenteric/small-bowel lesion in the RIGHT abdomen. Lesion is difficult to identify on the noncontrast CT but does localize to a 14 mm lesion adjacent the small bowel on image 147/4. The radiotracer activity is intense with SUV max equal 15.6. Small adjacent mesenteric nodule measuring 8 mm (image 42/4) also with radiotracer activity (SUV max equal 9.7). A third focus of radiotracer activity associated small bowel in the RIGHT abdomen just anterior to the ascending colon with SUV max equal 12.0. No clear CT lesion identified. Lesion found on image 152  of the fused data set. Hepatic metastasis seen on comparison MRI (07/05/2018) are not as clearly depicted on the DOTATATE PET scan. There are 2 lesions in the RIGHT hepatic lobe with radiotracer activity above background measuring approximately 1 cm each on image 101 of the fused data set. SUV max equal 14.8 and 13.8 respectively. Background liver activity is 12.3. Physiologic activity noted in the adrenal glands, spleen and kidneys. Incidental CT findings:None SKELETON No focal activity to suggest skeletal metastasis. Incidental CT findings:None IMPRESSION: 1. Small (1.5) metastatic mesentery lesion versus primary small bowel lesion in the RIGHT abdomen with intense radiotracer activity. Findings consistent well differentiated neuroendocrine tumor. 2. Small nodular mesenteric implant adjacent to the above dominant lesion. 3. Small implant along the RIGHT abdomen  associated with the small bowel without CT correlation. 4. At least 2 hepatic metastasis accumulate the somatostatin specific radiotracer radiotracer. The degree of radiotracer uptake within liver lesions is less than expected for well differentiated neuroendocrine tumor. This may be in part due to high background liver activity. Electronically Signed   By: Suzy Bouchard M.D.   On: 08/30/2018 15:58    Assessment & Plan:   Keilan was seen today for annual exam, diabetes, hyperlipidemia and hypertension.  Diagnoses and all orders for this visit:  Routine general medical examination at a health care facility- Exam completed, labs reviewed, he refused a flu vaccine and a pneumonia vaccine, colon cancer screening is up-to-date, patient education material was given. -     Lipid panel; Future -     PSA; Future  Hyperglycemia -     POCT glycosylated hemoglobin (Hb A1C) -     POCT Glucose (Device for Home Use) -     C-peptide; Future  Type II diabetes mellitus with manifestations ([place])- His A1c is 8.5% with trace ketones in his urine, glucosuria,  and a low normal C-peptide.  I have asked him to start using basal insulin, metformin, and an SGLT2 inhibitor. -     C-peptide; Future -     Microalbumin / creatinine urine ratio; Future -     HM Diabetes Foot Exam -     Empagliflozin-metFORMIN HCl ER (SYNJARDY XR) 01-999 MG TB24; Take 2 tablets by mouth daily. -     Blood Glucose Monitoring Suppl (CONTOUR NEXT ONE) KIT; 1 Act by Does not apply route 2 (two) times daily. -     insulin degludec (TRESIBA FLEXTOUCH) 100 UNIT/ML SOPN FlexTouch Pen; Inject 0.2 mLs (20 Units total) into the skin daily. -     Amb Referral to Nutrition and Diabetic E -     Insulin Pen Needle (NOVOFINE) 32G X 6 MM MISC; 1 Act by Does not apply route daily.  Hyperlipidemia LDL goal <130- I have asked him to start taking a statin for CV risk reduction. -     TSH; Future -     rosuvastatin (CRESTOR) 5 MG tablet; Take 1 tablet (5 mg total) by mouth daily.  Essential hypertension- His blood pressure is not adequately well controlled.  He will improve his lifestyle modifications.  He is going to start taking an SGLT2 inhibitor so this may help him reach his blood pressure goal of 130/80. -     Urinalysis, Routine w reflex microscopic; Future -     EKG 12-Lead   I am having Baltazar Apo start on Synjardy XR, Contour Next One, rosuvastatin, Tresiba FlexTouch, and NovoFine. I am also having him maintain his Ibuprofen.  Meds ordered this encounter  Medications  . Empagliflozin-metFORMIN HCl ER (SYNJARDY XR) 01-999 MG TB24    Sig: Take 2 tablets by mouth daily.    Dispense:  140 tablet    Refill:  0  . Blood Glucose Monitoring Suppl (CONTOUR NEXT ONE) KIT    Sig: 1 Act by Does not apply route 2 (two) times daily.    Dispense:  2 kit    Refill:  0  . rosuvastatin (CRESTOR) 5 MG tablet    Sig: Take 1 tablet (5 mg total) by mouth daily.    Dispense:  90 tablet    Refill:  1  . insulin degludec (TRESIBA FLEXTOUCH) 100 UNIT/ML SOPN FlexTouch Pen    Sig: Inject 0.2 mLs  (20 Units total) into the  skin daily.    Dispense:  9 mL    Refill:  1  . Insulin Pen Needle (NOVOFINE) 32G X 6 MM MISC    Sig: 1 Act by Does not apply route daily.    Dispense:  100 each    Refill:  3     Follow-up: Return in about 4 months (around 10/05/2019).  Scarlette Calico, MD

## 2019-06-21 DIAGNOSIS — C787 Secondary malignant neoplasm of liver and intrahepatic bile duct: Secondary | ICD-10-CM | POA: Diagnosis not present

## 2019-06-21 DIAGNOSIS — C7A019 Malignant carcinoid tumor of the small intestine, unspecified portion: Secondary | ICD-10-CM | POA: Diagnosis not present

## 2019-06-21 DIAGNOSIS — Z79899 Other long term (current) drug therapy: Secondary | ICD-10-CM | POA: Diagnosis not present

## 2019-07-17 ENCOUNTER — Encounter: Payer: Self-pay | Admitting: Internal Medicine

## 2019-07-19 DIAGNOSIS — C787 Secondary malignant neoplasm of liver and intrahepatic bile duct: Secondary | ICD-10-CM | POA: Diagnosis not present

## 2019-07-19 DIAGNOSIS — Z79899 Other long term (current) drug therapy: Secondary | ICD-10-CM | POA: Diagnosis not present

## 2019-07-19 DIAGNOSIS — C7A019 Malignant carcinoid tumor of the small intestine, unspecified portion: Secondary | ICD-10-CM | POA: Diagnosis not present

## 2019-08-15 ENCOUNTER — Telehealth: Payer: Self-pay | Admitting: *Deleted

## 2019-08-15 NOTE — Telephone Encounter (Signed)
Henry Lang,  This pt had a colon here 06-16-2018- reviewing his chart I see 12-08-2018 he had a small bowel resection and they used a Glipdescope to intubate him. Can you please review his chart and make sure he is ok for another [facility] colon?  Thanks, Lelan Pons

## 2019-08-18 NOTE — Telephone Encounter (Signed)
Per verbal conversation with Osvaldo Angst CRNA , pt is okay to have a colonoscopy procedure in the [place] also states he spoke to [employer] , who will be sedating this pt, and he said okay for Propofol.

## 2019-08-21 DIAGNOSIS — Z800 Family history of malignant neoplasm of digestive organs: Secondary | ICD-10-CM | POA: Diagnosis not present

## 2019-08-21 DIAGNOSIS — Z5111 Encounter for antineoplastic chemotherapy: Secondary | ICD-10-CM | POA: Diagnosis not present

## 2019-08-21 DIAGNOSIS — R918 Other nonspecific abnormal finding of lung field: Secondary | ICD-10-CM | POA: Diagnosis not present

## 2019-08-21 DIAGNOSIS — Z801 Family history of malignant neoplasm of trachea, bronchus and lung: Secondary | ICD-10-CM | POA: Diagnosis not present

## 2019-08-21 DIAGNOSIS — R739 Hyperglycemia, unspecified: Secondary | ICD-10-CM | POA: Diagnosis not present

## 2019-08-21 DIAGNOSIS — C7A019 Malignant carcinoid tumor of the small intestine, unspecified portion: Secondary | ICD-10-CM | POA: Diagnosis not present

## 2019-08-21 DIAGNOSIS — Z79899 Other long term (current) drug therapy: Secondary | ICD-10-CM | POA: Diagnosis not present

## 2019-08-21 DIAGNOSIS — C7A011 Malignant carcinoid tumor of the jejunum: Secondary | ICD-10-CM | POA: Diagnosis not present

## 2019-08-21 DIAGNOSIS — F1099 Alcohol use, unspecified with unspecified alcohol-induced disorder: Secondary | ICD-10-CM | POA: Diagnosis not present

## 2019-08-21 DIAGNOSIS — C7B02 Secondary carcinoid tumors of liver: Secondary | ICD-10-CM | POA: Diagnosis not present

## 2019-08-21 DIAGNOSIS — Z803 Family history of malignant neoplasm of breast: Secondary | ICD-10-CM | POA: Diagnosis not present

## 2019-08-21 DIAGNOSIS — R519 Headache, unspecified: Secondary | ICD-10-CM | POA: Diagnosis not present

## 2019-08-21 DIAGNOSIS — C787 Secondary malignant neoplasm of liver and intrahepatic bile duct: Secondary | ICD-10-CM | POA: Diagnosis not present

## 2019-08-23 ENCOUNTER — Ambulatory Visit (AMBULATORY_SURGERY_CENTER): Payer: BC Managed Care – PPO

## 2019-08-23 ENCOUNTER — Other Ambulatory Visit: Payer: Self-pay

## 2019-08-23 VITALS — Temp 97.3°F | Ht 71.0 in | Wt 223.4 lb

## 2019-08-23 DIAGNOSIS — Z800 Family history of malignant neoplasm of digestive organs: Secondary | ICD-10-CM

## 2019-08-23 DIAGNOSIS — Z1159 Encounter for screening for other viral diseases: Secondary | ICD-10-CM

## 2019-08-23 DIAGNOSIS — Z86010 Personal history of colonic polyps: Secondary | ICD-10-CM

## 2019-08-23 MED ORDER — PLENVU 140 G PO SOLR: 1.0000 | Freq: Once | ORAL | 0 refills | Status: AC

## 2019-08-23 NOTE — Progress Notes (Signed)
No egg or soy allergy known to patient  No issues with past sedation with any surgeries  or procedures, no intubation problems  No diet pills per patient No home 02 use per patient  No blood thinners per patient  Pt denies issues with constipation  No A fib or A flutter  EMMI video sent to pt's e mail  Coupon for plenvu given  Due to the COVID-19 pandemic we are asking patients to follow these guidelines. Please only bring one care partner. Please be aware that your care partner may wait in the car in the parking lot or if they feel like they will be too hot to wait in the car, they may wait in the lobby on the 4th floor. All care partners are required to wear a mask the entire time (we do not have any that we can provide them), they need to practice social distancing, and we will do a Covid check for all patient's and care partners when you arrive. Also we will check their temperature and your temperature. If the care partner waits in their car they need to stay in the parking lot the entire time and we will call them on their cell phone when the patient is ready for discharge so they can bring the car to the front of the building. Also all patient's will need to wear a mask into building.

## 2019-08-24 ENCOUNTER — Encounter: Payer: Self-pay | Admitting: Internal Medicine

## 2019-09-01 ENCOUNTER — Ambulatory Visit (INDEPENDENT_AMBULATORY_CARE_PROVIDER_SITE_OTHER): Payer: BC Managed Care – PPO

## 2019-09-01 ENCOUNTER — Other Ambulatory Visit: Payer: Self-pay | Admitting: Internal Medicine

## 2019-09-01 DIAGNOSIS — Z1159 Encounter for screening for other viral diseases: Secondary | ICD-10-CM | POA: Diagnosis not present

## 2019-09-01 LAB — SARS CORONAVIRUS 2 (TAT 6-24 HRS): SARS Coronavirus 2: NEGATIVE

## 2019-09-02 IMAGING — US CT ABDOMEN LIMITED W/O CM
1 series · 7 of 7 positions shown · non-contrast
Comparison: MRI 07/05/2018

CLINICAL DATA: 56-year-old with numerous small liver lesions on
MRI. Evaluate for CT-guided liver biopsy.

EXAM:
CT ABDOMEN WITHOUT CONTRAST LIMITED
TECHNIQUE: Multidetector CT imaging of the abdomen was performed following the
standard protocol without IV contrast.

[Series 1: ct abdomen limited w/o cm · 7 of 7 slices shown]
[im 1/7]
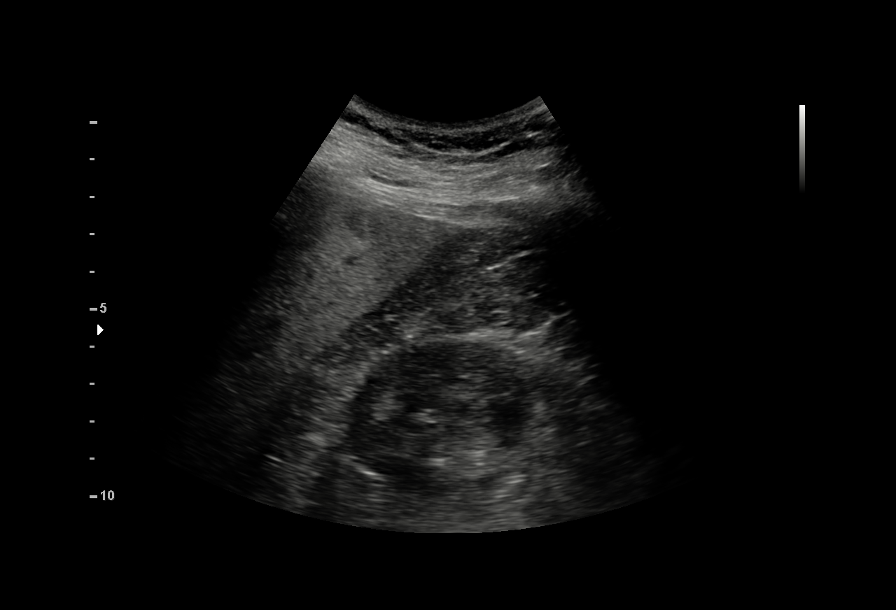
[im 2/7]
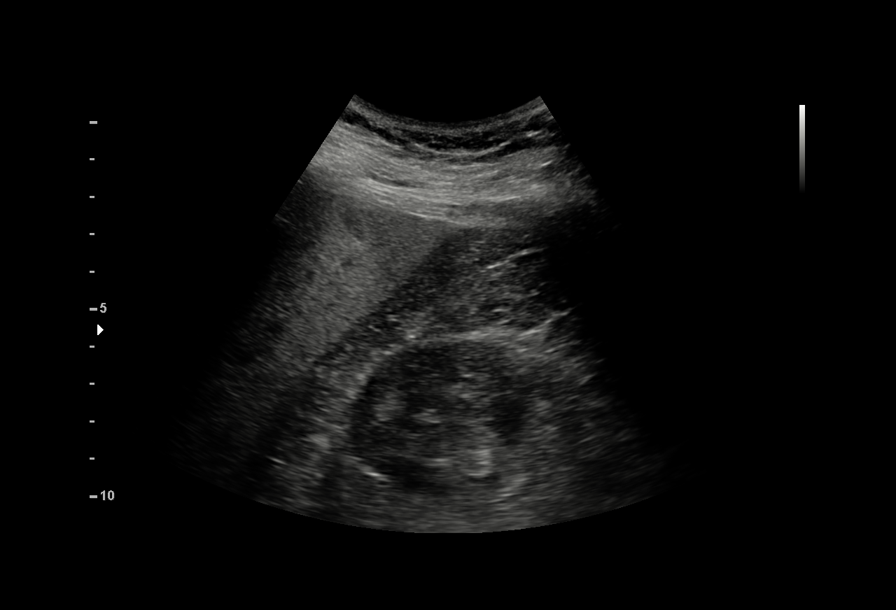
[im 3/7]
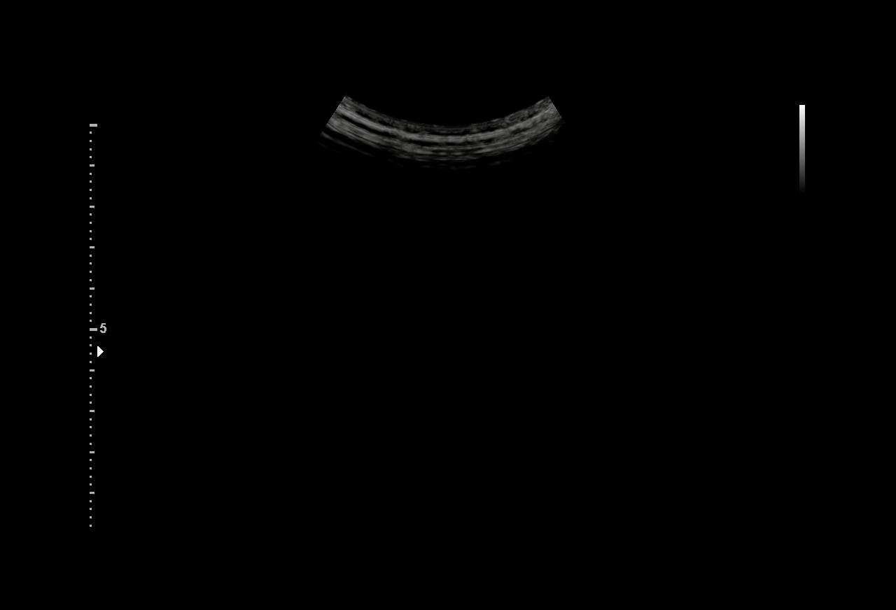
[im 4/7]
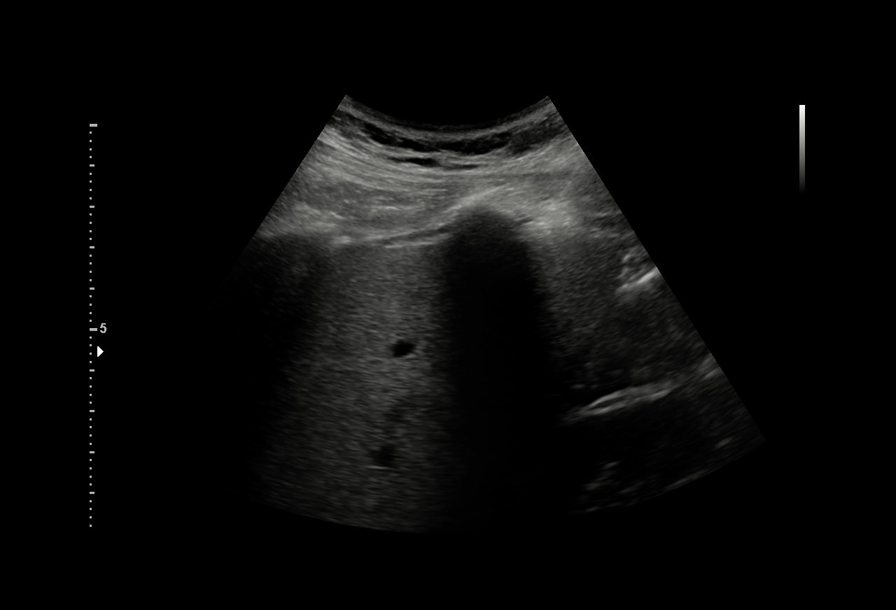
[im 5/7]
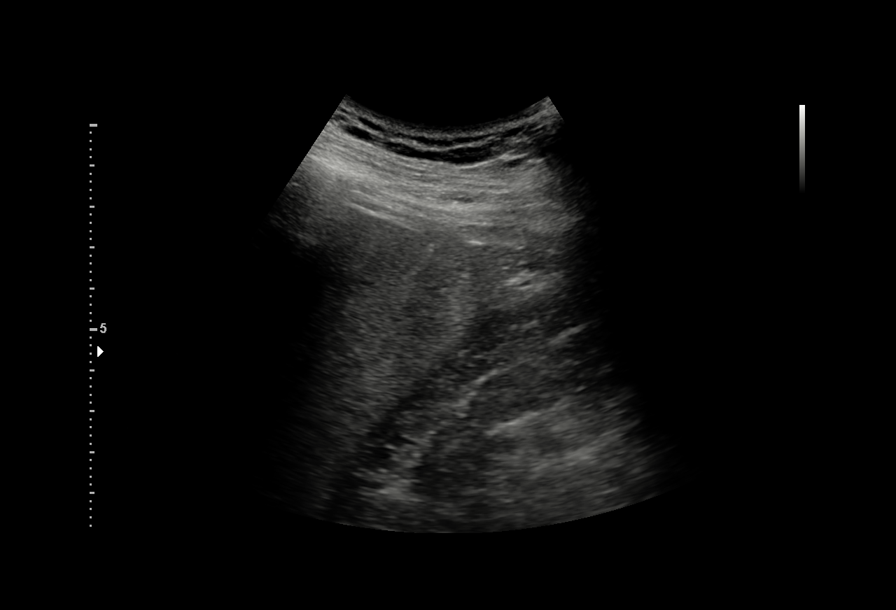
[im 6/7]
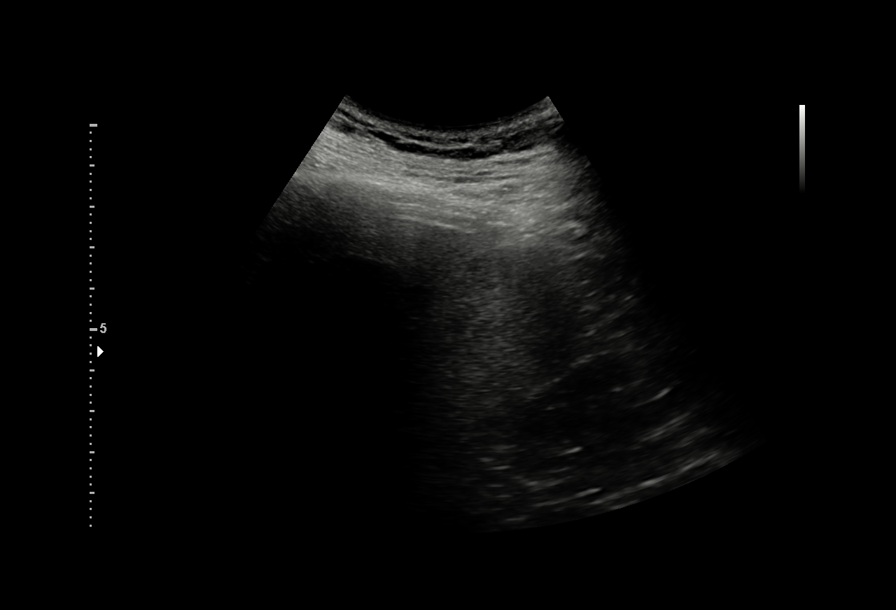
[im 7/7]
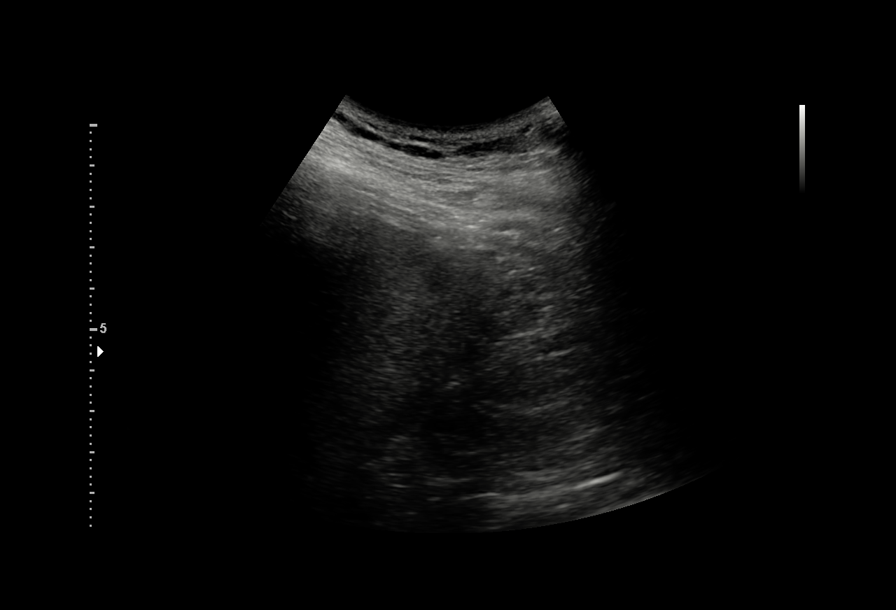

[7 of 7 positions shown; findings below may reference images not displayed]

FINDINGS: Lower chest: Dependent atelectasis in the lung bases. There is a 5
mm peripheral nodule in the right lower lobe near the right major
fissure.

Hepatobiliary: The small liver lesions are not confidently
identified. Again noted is a cystic structure in the left hepatic
lobe. Gallbladder is unremarkable.

Pancreas: Visualized pancreas is unremarkable.

Spleen: Normal in size without focal abnormality.

Adrenals/Urinary Tract: Normal adrenal glands. Kidneys are partially
visualized without gross abnormality and no hydronephrosis.

Stomach/Bowel: Normal appearance of the stomach. The other
visualized bowel structures are unremarkable.

Vascular/Lymphatic: Visualized vascular structures are unremarkable.
No enlarged lymph nodes in the upper abdomen.

Other: No ascites in the upper abdomen.

Musculoskeletal: Visualized structures are unremarkable.
IMPRESSION: 1. The suspicious liver lesions are not clearly identified on this
non contrast CT of the abdomen. Small lesions were identified with
ultrasound and ultrasound-guided liver biopsy was performed.
2. Indeterminate 5 mm nodule in the right lower lobe. This nodule
may require follow-up based on the etiology of the small liver
lesions.

## 2019-09-06 ENCOUNTER — Encounter: Payer: Self-pay | Admitting: Internal Medicine

## 2019-09-06 ENCOUNTER — Ambulatory Visit (AMBULATORY_SURGERY_CENTER): Payer: BC Managed Care – PPO | Admitting: Internal Medicine

## 2019-09-06 ENCOUNTER — Other Ambulatory Visit: Payer: Self-pay

## 2019-09-06 VITALS — BP 114/83 | HR 77 | Temp 98.6°F | Resp 19 | Ht 71.0 in | Wt 223.0 lb

## 2019-09-06 DIAGNOSIS — Z86010 Personal history of colonic polyps: Secondary | ICD-10-CM | POA: Diagnosis not present

## 2019-09-06 DIAGNOSIS — Z1211 Encounter for screening for malignant neoplasm of colon: Secondary | ICD-10-CM | POA: Diagnosis not present

## 2019-09-06 MED ORDER — SODIUM CHLORIDE 0.9 % IV SOLN: 500.0000 mL | INTRAVENOUS | Status: DC

## 2019-09-06 NOTE — Progress Notes (Signed)
PT taken to PACU. Monitors in place. VSS. Report given to RN. 

## 2019-09-06 NOTE — Patient Instructions (Signed)
Your colonoscpy was normal.  Due to your family history, you will  need a repeat colonoscopy in 3 years.  Thank-you for choosing [place] for your healthcare needs today.  YOU HAD AN ENDOSCOPIC PROCEDURE TODAY AT [facility]:   Refer to the procedure report that was given to you for any specific questions about what was found during the examination.  If the procedure report does not answer your questions, please call your gastroenterologist to clarify.  If you requested that your care partner not be given the details of your procedure findings, then the procedure report has been included in a sealed envelope for you to review at your convenience later.  YOU SHOULD EXPECT: Some feelings of bloating in the abdomen. Passage of more gas than usual.  Walking can help get rid of the air that was put into your GI tract during the procedure and reduce the bloating. If you had a lower endoscopy (such as a colonoscopy or flexible sigmoidoscopy) you may notice spotting of blood in your stool or on the toilet paper. If you underwent a bowel prep for your procedure, you may not have a normal bowel movement for a few days.  Please Note:  You might notice some irritation and congestion in your nose or some drainage.  This is from the oxygen used during your procedure.  There is no need for concern and it should clear up in a day or so.  SYMPTOMS TO REPORT IMMEDIATELY:   Following lower endoscopy (colonoscopy or flexible sigmoidoscopy):  Excessive amounts of blood in the stool  Significant tenderness or worsening of abdominal pains  Swelling of the abdomen that is new, acute  Fever of 100F or higher   For urgent or emergent issues, a gastroenterologist can be reached at any hour by calling [phone].   DIET:  We do recommend a small meal at first, but then you may proceed to your regular diet.  Drink plenty of fluids but you should avoid alcoholic beverages for 24 hours.  ACTIVITY:  You  should plan to take it easy for the rest of today and you should NOT DRIVE or use heavy machinery until tomorrow (because of the sedation medicines used during the test).    FOLLOW UP: Our staff will call the number listed on your records 48-72 hours following your procedure to check on you and address any questions or concerns that you may have regarding the information given to you following your procedure. If we do not reach you, we will leave a message.  We will attempt to reach you two times.  During this call, we will ask if you have developed any symptoms of COVID 19. If you develop any symptoms (ie: fever, flu-like symptoms, shortness of breath, cough etc.) before then, please call [phone].  If you test positive for Covid 19 in the 2 weeks post procedure, please call and report this information to [place].     SIGNATURES/CONFIDENTIALITY: You and/or your care partner have signed paperwork which will be entered into your electronic medical record.  These signatures attest to the fact that that the information above on your After Visit Summary has been reviewed and is understood.  Full responsibility of the confidentiality of this discharge information lies with you and/or your care-partner.

## 2019-09-06 NOTE — Op Note (Signed)
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 The patient's tolerance of                            previous anesthesia was also reviewed. The risks                            and benefits of the procedure and the sedation                            options and risks were discussed with the patient.                            All questions were answered, and informed consent                            was obtained. Prior Anticoagulants: The patient has                            taken no previous anticoagulant or antiplatelet  agents. ASA Grade Assessment: II - A patient with                            mild systemic disease. After reviewing the risks                            and benefits, the patient was deemed in                            satisfactory condition to undergo the procedure.                           After obtaining informed consent, the colonoscope                            was passed under direct vision. Throughout the          procedure, the patient's blood pressure, pulse, and                            oxygen saturations were monitored continuously. The                            Colonoscope was introduced through the anus and                            advanced to the terminal ileum. The colonoscopy was                            performed without difficulty. The patient tolerated                            the procedure well. The quality of the bowel                            preparation was good. The terminal ileum, ileocecal                            valve, appendiceal orifice, and rectum were                            photographed. Scope In: 9:31:35 AM Scope Out: 9:48:49 AM Scope Withdrawal Time: 0 hours 12 minutes 43 seconds  Total Procedure Duration: 0 hours 17 minutes 14 seconds  Findings:                 The digital rectal exam was normal.                           The terminal ileum appeared normal.                           The colon (entire examined portion) appeared normal.                           Internal hemorrhoids  were found during                            retroflexion. The hemorrhoids were small. Complications:            No immediate complications. Estimated Blood Loss:     Estimated blood loss: none. Impression:               - The examined portion of the ileum was normal.                           - The entire examined colon is normal.                           - Internal hemorrhoids.                           - No specimens collected. Recommendation:           - Patient has a contact number available for                            emergencies. The signs and symptoms of potential                            delayed complications were discussed with the                            patient. Return to normal activities tomorrow.                            Written discharge instructions were provided to the                            patient.                           - Resume  previous diet.                           - Continue present medications.                           - Repeat colonoscopy in 3 years for surveillance                            (this recommendation will be dependent on results                            from upcoming medical genetics evaluation given                            personal history of multiple colon polyps and                            family history of colon cancer; if genetic syndrome  found such as Lynch Syndrome, then annual                            colonoscopy will be recommended. If genetics                            evaluation is negative, then repeat colonoscopy is                            recommended in 3 years given personal history of                            multiple precancerous colon polyps and family                            history). Jerene Bears, MD 09/06/2019 9:54:31 AM This report has been signed electronically.

## 2019-09-11 ENCOUNTER — Telehealth: Payer: Self-pay | Admitting: *Deleted

## 2019-09-11 NOTE — Telephone Encounter (Signed)
  Follow up Call-  Call back number 09/06/2019 07/14/2018  Post procedure Call Back phone  # [phone]  Permission to leave phone message Yes Yes  Some recent data might be hidden     Patient questions:  Do you have a fever, pain , or abdominal swelling? No. Pain Score  0 *  Have you tolerated food without any problems? Yes.    Have you been able to return to your normal activities? Yes.    Do you have any questions about your discharge instructions: Diet   No. Medications  No. Follow up visit  No.  Do you have questions or concerns about your Care? No.  Actions: * If pain score is 4 or above: No action needed, pain <4.   1. Have you developed a fever since your procedure? no  2.   Have you had an respiratory symptoms (SOB or cough) since your procedure? no  3.   Have you tested positive for COVID 19 since your procedure no  4.   Have you had any family members/close contacts diagnosed with the COVID 19 since your procedure?  no   If yes to any of these questions please route to Joylene John, RN and Alphonsa Gin, Therapist, sports.

## 2019-09-12 ENCOUNTER — Telehealth: Payer: Self-pay | Admitting: Internal Medicine

## 2019-09-12 NOTE — Telephone Encounter (Signed)
Copied from [place] [id number]. Topic: General - Other >> Sep 12, 2019  4:13 PM Keene Breath wrote: Reason for CRM: Patient would like to have his labs drawn before his appt. On 10/02/19.  Please call to discuss at [phone] (wife Missy)

## 2019-09-13 NOTE — Telephone Encounter (Signed)
Spoke with patients wife Missy, she was advised that we don't do labs prior to appts and she verbalized understanding.

## 2019-09-18 DIAGNOSIS — Z79899 Other long term (current) drug therapy: Secondary | ICD-10-CM | POA: Diagnosis not present

## 2019-09-18 DIAGNOSIS — C7A019 Malignant carcinoid tumor of the small intestine, unspecified portion: Secondary | ICD-10-CM | POA: Diagnosis not present

## 2019-09-18 DIAGNOSIS — C787 Secondary malignant neoplasm of liver and intrahepatic bile duct: Secondary | ICD-10-CM | POA: Diagnosis not present

## 2019-09-28 IMAGING — PT NM PET NOPR SKULL BASE TO THIGH
1 of 8 series · 2 of 25 positions shown · non-contrast
Comparison: None.

CLINICAL DATA: Well differentiated neuroendocrine tumor.

EXAM:
NUCLEAR MEDICINE PET SKULL BASE TO THIGH
TECHNIQUE: mCi Ga 68 DOTATATE was injected intravenously. Full-ring PET imaging
was performed from the skull base to thigh after the radiotracer. CT
data was obtained and used for attenuation correction and anatomic
localization.

[Series 4: ct sk_thigh 5.0 b31f · axial · 5.0mm · 0.98mm/px · z∈[-1308,-1060]mm · 2 of 249 slices shown]
[im 63/249  brain]
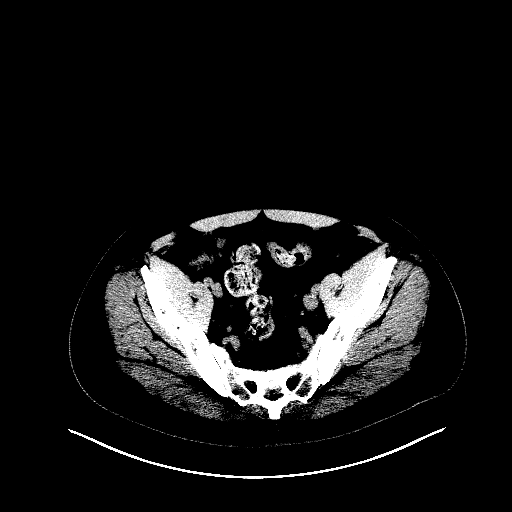
[im 125/249  brain]
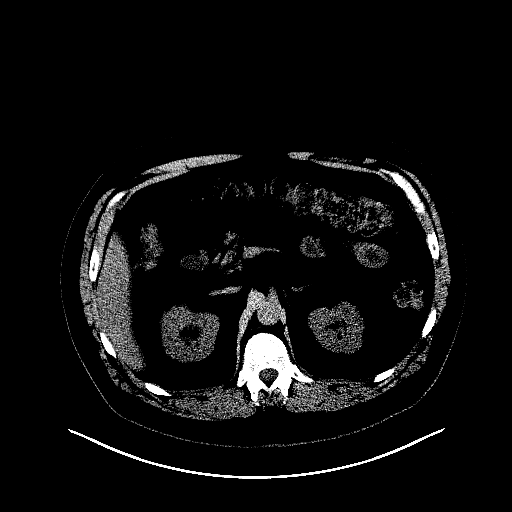

[2 of 25 positions shown; findings below may reference images not displayed]

FINDINGS: NECK

No radiotracer activity in neck lymph nodes.

Incidental CT findings: None

CHEST

No radiotracer accumulation within mediastinal or hilar lymph nodes.
No suspicious pulmonary nodules on the CT scan.

Incidental CT finding:4 mm RIGHT lower lobe nodule on image 88/4. No
associated radiotracer activity. Posterior RIGHT lower lobe nodule
measuring 4 mm on image 87/4 also without metabolic activity.
Radiotracer activity

ABDOMEN/PELVIS

Intense radiotracer activity associated with a small
mesenteric/small-bowel lesion in the RIGHT abdomen. Lesion is
difficult to identify on the noncontrast CT but does localize to a
14 mm lesion adjacent the small bowel on image 147/4. The
radiotracer activity is intense with SUV max equal 15.6. Small
adjacent mesenteric nodule measuring 8 mm (image 42/4) also with
radiotracer activity (SUV max equal 9.7).

A third focus of radiotracer activity associated small bowel in the
RIGHT abdomen just anterior to the ascending colon with SUV max
equal 12.0. No clear CT lesion identified. Lesion found on image 152
of the fused data set.

Hepatic metastasis seen on comparison MRI (07/05/2018) are not as
clearly depicted on the DOTATATE PET scan. There are 2 lesions in
the RIGHT hepatic lobe with radiotracer activity above background
measuring approximately 1 cm each on image 101 of the fused data
set. SUV max equal 14.8 and 13.8 respectively. Background liver
activity is 12.3.

Physiologic activity noted in the adrenal glands, spleen and
kidneys.

Incidental CT findings:None

SKELETON

No focal activity to suggest skeletal metastasis.

Incidental CT findings:None
IMPRESSION: 1. Small (1.5) metastatic mesentery lesion versus primary small
bowel lesion in the RIGHT abdomen with intense radiotracer activity.
Findings consistent well differentiated neuroendocrine tumor.
2. Small nodular mesenteric implant adjacent to the above dominant
lesion.
3. Small implant along the RIGHT abdomen associated with the small
bowel without CT correlation.
4. At least 2 hepatic metastasis accumulate the somatostatin
specific radiotracer radiotracer.. The degree of radiotracer uptake
within liver lesions is less than expected for well differentiated
neuroendocrine tumor. This may be in part due to high background
liver activity.

## 2019-10-02 ENCOUNTER — Other Ambulatory Visit: Payer: Self-pay

## 2019-10-02 ENCOUNTER — Encounter: Payer: Self-pay | Admitting: Internal Medicine

## 2019-10-02 ENCOUNTER — Ambulatory Visit (INDEPENDENT_AMBULATORY_CARE_PROVIDER_SITE_OTHER): Payer: BC Managed Care – PPO

## 2019-10-02 ENCOUNTER — Ambulatory Visit (INDEPENDENT_AMBULATORY_CARE_PROVIDER_SITE_OTHER): Payer: BC Managed Care – PPO | Admitting: Internal Medicine

## 2019-10-02 VITALS — BP 124/82 | HR 70 | Temp 97.8°F | Ht 71.0 in | Wt 213.0 lb

## 2019-10-02 DIAGNOSIS — E118 Type 2 diabetes mellitus with unspecified complications: Secondary | ICD-10-CM | POA: Diagnosis not present

## 2019-10-02 DIAGNOSIS — I10 Essential (primary) hypertension: Secondary | ICD-10-CM | POA: Diagnosis not present

## 2019-10-02 DIAGNOSIS — R109 Unspecified abdominal pain: Secondary | ICD-10-CM

## 2019-10-02 LAB — CBC WITH DIFFERENTIAL/PLATELET
Basophils Absolute: 0 10*3/uL (ref 0.0–0.1)
Basophils Relative: 0.6 % (ref 0.0–3.0)
Eosinophils Absolute: 0.1 10*3/uL (ref 0.0–0.7)
Eosinophils Relative: 1.8 % (ref 0.0–5.0)
HCT: 43.7 % (ref 39.0–52.0)
Hemoglobin: 14.8 g/dL (ref 13.0–17.0)
Lymphocytes Relative: 31.8 % (ref 12.0–46.0)
Lymphs Abs: 1.9 10*3/uL (ref 0.7–4.0)
MCHC: 33.9 g/dL (ref 30.0–36.0)
MCV: 87.9 fl (ref 78.0–100.0)
Monocytes Absolute: 0.4 10*3/uL (ref 0.1–1.0)
Monocytes Relative: 6.6 % (ref 3.0–12.0)
Neutro Abs: 3.6 10*3/uL (ref 1.4–7.7)
Neutrophils Relative %: 59.2 % (ref 43.0–77.0)
Platelets: 204 10*3/uL (ref 150.0–400.0)
RBC: 4.97 Mil/uL (ref 4.22–5.81)
RDW: 14.1 % (ref 11.5–15.5)
WBC: 6 10*3/uL (ref 4.0–10.5)

## 2019-10-02 LAB — URINALYSIS, ROUTINE W REFLEX MICROSCOPIC
Bilirubin Urine: NEGATIVE
Hgb urine dipstick: NEGATIVE
Ketones, ur: 15 — AB
Leukocytes,Ua: NEGATIVE
Nitrite: NEGATIVE
RBC / HPF: NONE SEEN (ref 0–?)
Specific Gravity, Urine: >=1.03 — AB (ref 1.000–1.030)
Total Protein, Urine: NEGATIVE
Urine Glucose: >=1000 — AB
Urobilinogen, UA: 0.2 (ref 0.0–1.0)
pH: 5.5 (ref 5.0–8.0)

## 2019-10-02 LAB — BASIC METABOLIC PANEL
BUN: 17 mg/dL (ref 6–23)
CO2: 24 mEq/L (ref 19–32)
Calcium: 9.6 mg/dL (ref 8.4–10.5)
Chloride: 106 mEq/L (ref 96–112)
Creatinine, Ser: 0.71 mg/dL (ref 0.40–1.50)
GFR: 114.31 mL/min (ref >60.00)
Glucose, Bld: 87 mg/dL (ref 70–99)
Potassium: 3.6 mEq/L (ref 3.5–5.1)
Sodium: 141 mEq/L (ref 135–145)

## 2019-10-02 LAB — HEMOGLOBIN A1C: Hgb A1c MFr Bld: 6 % (ref 4.6–6.5)

## 2019-10-02 LAB — HEPATIC FUNCTION PANEL
ALT: 36 U/L (ref 0–53)
AST: 30 U/L (ref 0–37)
Albumin: 4.5 g/dL (ref 3.5–5.2)
Alkaline Phosphatase: 70 U/L (ref 39–117)
Bilirubin, Direct: 0.2 mg/dL (ref 0.0–0.3)
Total Bilirubin: 0.7 mg/dL (ref 0.2–1.2)
Total Protein: 7.4 g/dL (ref 6.0–8.3)

## 2019-10-02 NOTE — Patient Instructions (Signed)
Flank Pain, Adult Flank pain is pain that is located on the side of the body between the upper abdomen and the back. This area is called the flank. The pain may occur over a short period of time (acute), or it may be long-term or recurring (chronic). It may be mild or severe. Flank pain can be caused by many things, including:  Muscle soreness or injury.  Kidney stones or kidney disease.  Stress.  A disease of the spine (vertebral disk disease).  A lung infection (pneumonia).  Fluid around the lungs (pulmonary edema).  A skin rash caused by the chickenpox virus (shingles).  Tumors that affect the back of the abdomen.  Gallbladder disease. Follow these instructions at home:   Drink enough fluid to keep your urine clear or pale yellow.  Rest as told by your health care provider.  Take over-the-counter and prescription medicines only as told by your health care provider.  Keep a journal to track what has caused your flank pain and what has made it feel better.  Keep all follow-up visits as told by your health care provider. This is important. Contact a health care provider if:  Your pain is not controlled with medicine.  You have new symptoms.  Your pain gets worse.  You have a fever.  Your symptoms last longer than 2-3 days.  You have trouble urinating or you are urinating very frequently. Get help right away if:  You have trouble breathing or you are short of breath.  Your abdomen hurts or it is swollen or red.  You have nausea or vomiting.  You feel faint or you pass out.  You have blood in your urine. Summary  Flank pain is pain that is located on the side of the body between the upper abdomen and the back.  The pain may occur over a short period of time (acute), or it may be long-term or recurring (chronic). It may be mild or severe.  Flank pain can be caused by many things.  Contact your health care provider if your symptoms get worse or they last  longer than 2-3 days. This information is not intended to replace advice given to you by your health care provider. Make sure you discuss any questions you have with your health care provider. Document Revised: 08/13/2017 Document Reviewed: 11/13/2016 Elsevier Patient Education  2020 [employer]  

## 2019-10-02 NOTE — Progress Notes (Signed)
Subjective:  Patient ID: Henry Lang, male    DOB: September 08, 1961  Age: 59 y.o. MRN: [id number]  CC: Diabetes, Hypertension, and Flank Pain  This visit occurred during the SARS-CoV-2 public health emergency.  Safety protocols were in place, including screening questions prior to the visit, additional usage of staff PPE, and extensive cleaning of exam room while observing appropriate contact time as indicated for disinfecting solutions.    HPI Henry Lang presents for f/up - He is lost weight with lifestyle modifications.  He tells me his blood sugars have been well controlled.  He complains of recent episodes of dizziness.  He has also had intermittent flank pain for about 3 or 4 months.  He describes it as an intermittent stabbing sensation in both lateral flanks.  The pain is sharp and related to position and activity.  He denies abdominal pain, nausea, vomiting, loss of appetite, dysuria, or hematuria.  Outpatient Medications Prior to Visit  Medication Sig Dispense Refill   Blood Glucose Monitoring Suppl (CONTOUR NEXT ONE) KIT 1 Act by Does not apply route 2 (two) times daily. 2 kit 0   Insulin Pen Needle (NOVOFINE) 32G X 6 MM MISC 1 Act by Does not apply route daily. 100 each 3   Empagliflozin-metFORMIN HCl ER (SYNJARDY XR) 01-999 MG TB24 Take 2 tablets by mouth daily. 140 tablet 0   Ibuprofen 200 MG CAPS Take 800 mg by mouth.     rosuvastatin (CRESTOR) 5 MG tablet Take 1 tablet (5 mg total) by mouth daily. 90 tablet 1   acyclovir cream (ZOVIRAX) 5 % Apply topically.     oxyCODONE (OXY IR/ROXICODONE) 5 MG immediate release tablet oxycodone 5 mg tablet     insulin degludec (TRESIBA FLEXTOUCH) 100 UNIT/ML SOPN FlexTouch Pen Inject 0.2 mLs (20 Units total) into the skin daily. (Patient not taking: Reported on 10/02/2019) 9 mL 1   Melatonin 10 MG CAPS Take by mouth.     No facility-administered medications prior to visit.    ROS Review of Systems  Constitutional: Negative for  diaphoresis, fatigue, fever and unexpected weight change.  HENT: Negative.  Negative for trouble swallowing.   Respiratory: Negative for cough, chest tightness, shortness of breath and wheezing.   Cardiovascular: Negative for chest pain, palpitations and leg swelling.  Gastrointestinal: Negative for abdominal pain, constipation, diarrhea, nausea and vomiting.  Genitourinary: Positive for flank pain. Negative for difficulty urinating, dysuria, hematuria and urgency.  Musculoskeletal: Negative for back pain.  Skin: Negative.  Negative for rash.  Neurological: Positive for dizziness. Negative for weakness, light-headedness and headaches.  Hematological: Negative for adenopathy. Does not bruise/bleed easily.  Psychiatric/Behavioral: Negative.     Objective:  BP 124/82 (BP Location: Right Arm, Patient Position: Sitting, Cuff Size: Normal)    Pulse 70    Temp 97.8 F (36.6 C) (Oral)    Ht '5\' 11"'  (1.803 m)    Wt 213 lb (96.6 kg)    SpO2 97%    BMI 29.71 kg/m   BP Readings from Last 3 Encounters:  10/02/19 124/82  09/06/19 114/83  06/05/19 (!) 154/92    Wt Readings from Last 3 Encounters:  10/02/19 213 lb (96.6 kg)  09/06/19 223 lb (101.2 kg)  08/23/19 223 lb 6.4 oz (101.3 kg)    Physical Exam Vitals reviewed.  Constitutional:      Appearance: Normal appearance.  HENT:     Nose: Nose normal.     Mouth/Throat:     Mouth: Mucous membranes are  moist.  Eyes:     General: No scleral icterus.    Conjunctiva/sclera: Conjunctivae normal.  Cardiovascular:     Rate and Rhythm: Normal rate and regular rhythm.     Heart sounds: No murmur.  Pulmonary:     Effort: Pulmonary effort is normal.     Breath sounds: No stridor. No wheezing, rhonchi or rales.  Abdominal:     General: Abdomen is flat. Bowel sounds are normal. There is no distension.     Palpations: Abdomen is soft. There is no hepatomegaly, splenomegaly or mass.     Tenderness: There is no abdominal tenderness. There is no right  CVA tenderness or left CVA tenderness.     Hernia: No hernia is present.  Musculoskeletal:        General: Normal range of motion.     Cervical back: Neck supple.     Right lower leg: No edema.     Left lower leg: No edema.  Lymphadenopathy:     Cervical: No cervical adenopathy.  Skin:    General: Skin is warm and dry.  Neurological:     General: No focal deficit present.     Mental Status: He is alert.  Psychiatric:        Mood and Affect: Mood normal.        Behavior: Behavior normal.     Lab Results  Component Value Date   WBC 6.0 10/02/2019   HGB 14.8 10/02/2019   HCT 43.7 10/02/2019   PLT 204.0 10/02/2019   GLUCOSE 87 10/02/2019   CHOL 123 06/05/2019   TRIG 142.0 06/05/2019   HDL 32.60 (L) 06/05/2019   LDLCALC 62 06/05/2019   ALT 36 10/02/2019   AST 30 10/02/2019   NA 141 10/02/2019   K 3.6 10/02/2019   CL 106 10/02/2019   CREATININE 0.71 10/02/2019   BUN 17 10/02/2019   CO2 24 10/02/2019   TSH 2.74 06/05/2019   PSA 0.47 06/05/2019   INR 0.99 08/04/2018   HGBA1C 6.0 10/02/2019   MICROALBUR 0.7 06/05/2019    NM PET (NETSPOT GA 68 DOTATATE) SKULL BASE TO MID THIGH  Result Date: 08/30/2018 CLINICAL DATA:  Well differentiated neuroendocrine tumor. EXAM: NUCLEAR MEDICINE PET SKULL BASE TO THIGH TECHNIQUE: mCi Ga 41 DOTATATE was injected intravenously. Full-ring PET imaging was performed from the skull base to thigh after the radiotracer. CT data was obtained and used for attenuation correction and anatomic localization. COMPARISON:  None. FINDINGS: NECK No radiotracer activity in neck lymph nodes. Incidental CT findings: None CHEST No radiotracer accumulation within mediastinal or hilar lymph nodes. No suspicious pulmonary nodules on the CT scan. Incidental CT finding:4 mm RIGHT lower lobe nodule on image 88/4. No associated radiotracer activity. Posterior RIGHT lower lobe nodule measuring 4 mm on image 87/4 also without metabolic activity. Radiotracer activity  ABDOMEN/PELVIS Intense radiotracer activity associated with a small mesenteric/small-bowel lesion in the RIGHT abdomen. Lesion is difficult to identify on the noncontrast CT but does localize to a 14 mm lesion adjacent the small bowel on image 147/4. The radiotracer activity is intense with SUV max equal 15.6. Small adjacent mesenteric nodule measuring 8 mm (image 42/4) also with radiotracer activity (SUV max equal 9.7). A third focus of radiotracer activity associated small bowel in the RIGHT abdomen just anterior to the ascending colon with SUV max equal 12.0. No clear CT lesion identified. Lesion found on image 152 of the fused data set. Hepatic metastasis seen on comparison MRI (07/05/2018) are not as  clearly depicted on the DOTATATE PET scan. There are 2 lesions in the RIGHT hepatic lobe with radiotracer activity above background measuring approximately 1 cm each on image 101 of the fused data set. SUV max equal 14.8 and 13.8 respectively. Background liver activity is 12.3. Physiologic activity noted in the adrenal glands, spleen and kidneys. Incidental CT findings:None SKELETON No focal activity to suggest skeletal metastasis. Incidental CT findings:None IMPRESSION: 1. Small (1.5) metastatic mesentery lesion versus primary small bowel lesion in the RIGHT abdomen with intense radiotracer activity. Findings consistent well differentiated neuroendocrine tumor. 2. Small nodular mesenteric implant adjacent to the above dominant lesion. 3. Small implant along the RIGHT abdomen associated with the small bowel without CT correlation. 4. At least 2 hepatic metastasis accumulate the somatostatin specific radiotracer radiotracer. The degree of radiotracer uptake within liver lesions is less than expected for well differentiated neuroendocrine tumor. This may be in part due to high background liver activity. Electronically Signed   By: Suzy Bouchard M.D.   On: 08/30/2018 15:58    DG Abd Acute W/Chest  Result Date:  10/02/2019 CLINICAL DATA:  Bilateral flank pain EXAM: DG ABDOMEN ACUTE W/ 1V CHEST COMPARISON:  Chest 10/21/2012 FINDINGS: Cardiac and mediastinal contours normal.  Lungs are clear Normal bowel gas pattern. No bowel obstruction or mass. No urinary tract calculi. No skeletal abnormality. IMPRESSION: Negative abdominal radiographs.  No acute cardiopulmonary disease. Electronically Signed   By: Franchot Gallo M.D.   On: 10/02/2019 12:42    Assessment & Plan:   Onofre was seen today for diabetes, hypertension and flank pain.  Diagnoses and all orders for this visit:  Bilateral flank pain- Based on his description of the pain, normal examination, normal labs, and normal plain films this is consistent with musculoskeletal pain.  He is getting adequate symptom relief with his current medications.  He will let me know if he develops any new or worsening symptoms at which time we may consider doing a CT scan of the abdomen and pelvis. -     DG Abd Acute W/Chest; Future -     Hepatic function panel -     Urinalysis, Routine w reflex microscopic  Essential hypertension- His blood pressure is adequately well controlled. -     CBC with Differential -     Urinalysis, Routine w reflex microscopic -     Basic metabolic panel  Type II diabetes mellitus with manifestations ([place])- His A1c is down to 6.0%.  He complains of dizziness.  He has improved his lifestyle modifications. I have recommended that he stop taking Metformin and SGLT2 inhibitor. -     Hemoglobin A1c -     Basic metabolic panel   I have discontinued Gomez Cleverly. [place]'s Ibuprofen, Synjardy XR, rosuvastatin, Tyler Aas FlexTouch, and Melatonin. I am also having him maintain his Contour Next One, NovoFine, acyclovir cream, and oxyCODONE.  No orders of the defined types were placed in this encounter.    Follow-up: Return in about 3 months (around 12/31/2019).  Scarlette Calico, MD

## 2019-10-03 ENCOUNTER — Telehealth: Payer: Self-pay

## 2019-10-03 NOTE — Telephone Encounter (Signed)
Pt informed of lab results that were sent via mychart. Pt agreed to stop the [place]. Pt will let us know how is doing and if his sugars become elevated.

## 2019-10-03 NOTE — Telephone Encounter (Signed)
Copied from [place] [id number]. Topic: General - Other >> Oct 03, 2019  1:44 PM Sheran Luz wrote: Patient states that a medication was supposed to be called into pharmacy after last visit. Per pharmacy, nothing new has been sent in. Patient requesting to speak with CMA.

## 2019-10-16 DIAGNOSIS — Z79899 Other long term (current) drug therapy: Secondary | ICD-10-CM | POA: Diagnosis not present

## 2019-10-16 DIAGNOSIS — C7A019 Malignant carcinoid tumor of the small intestine, unspecified portion: Secondary | ICD-10-CM | POA: Diagnosis not present

## 2019-10-16 DIAGNOSIS — C787 Secondary malignant neoplasm of liver and intrahepatic bile duct: Secondary | ICD-10-CM | POA: Diagnosis not present

## 2019-10-21 IMAGING — US US BIOPSY CORE LIVER
1 series · 10 of 10 positions shown · non-contrast
Comparison: none

INDICATION: 56-year-old male with a history of small lesions on MRI concerning
for metastases.

[Series 1: us biopsy core liver · 0.17mm/px · 10 of 10 slices shown]
[im 1/10]
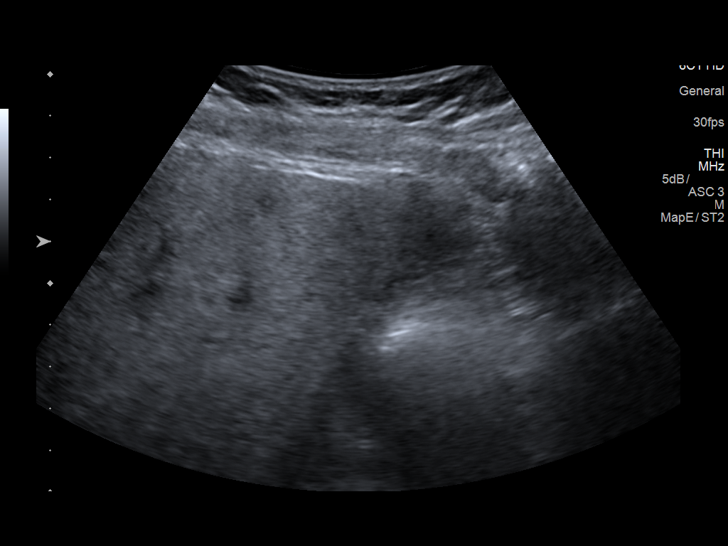
[im 2/10]
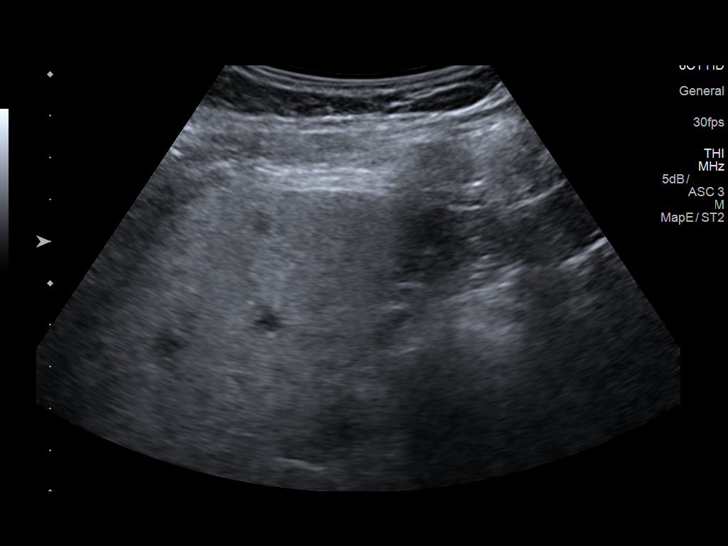
[im 3/10]
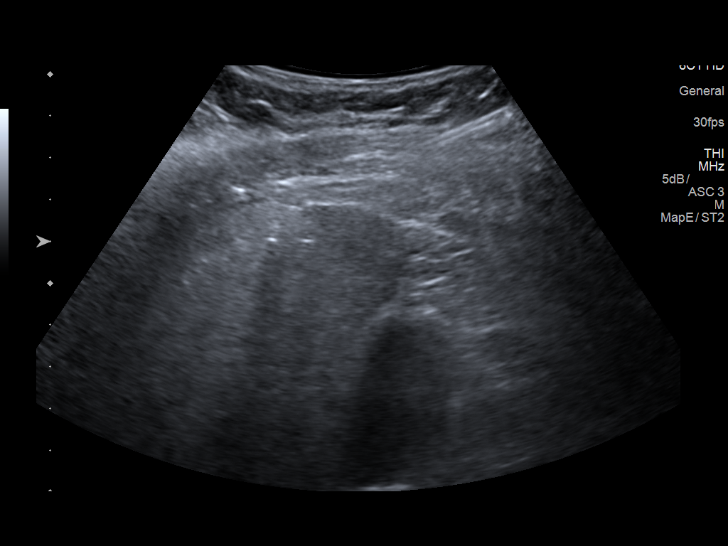
[im 4/10]
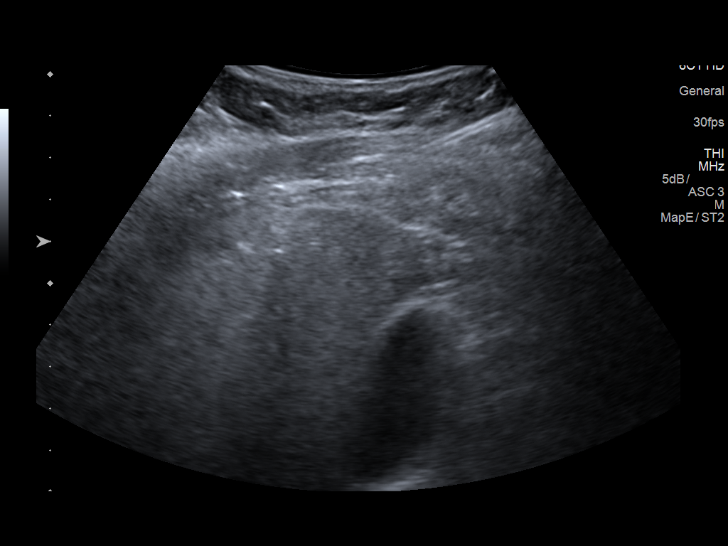
[im 5/10]
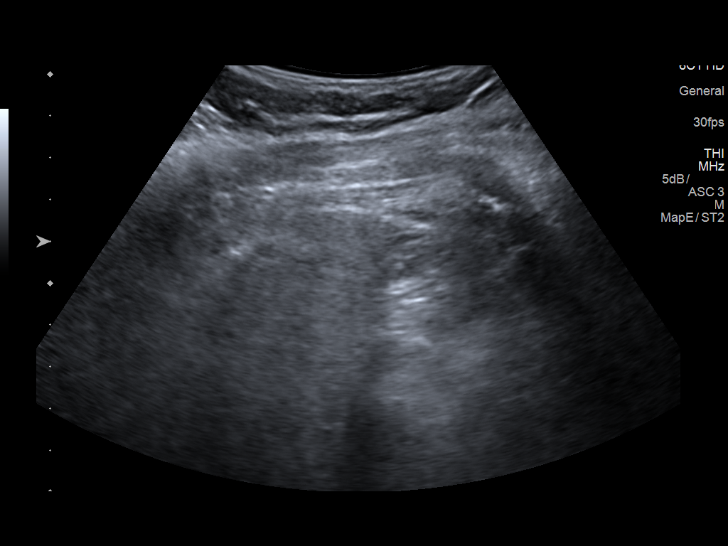
[im 6/10]
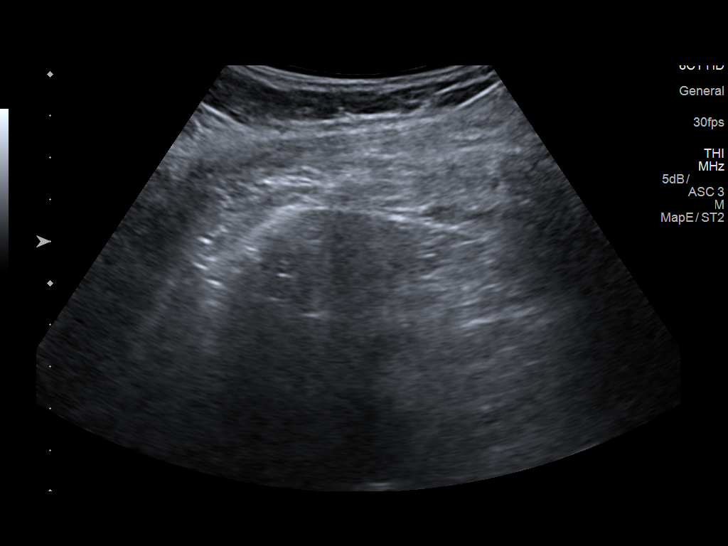
[im 7/10]
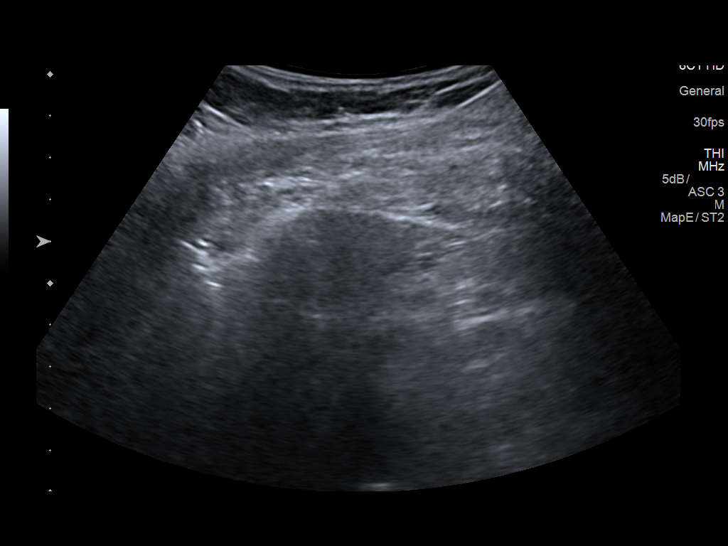
[im 8/10]
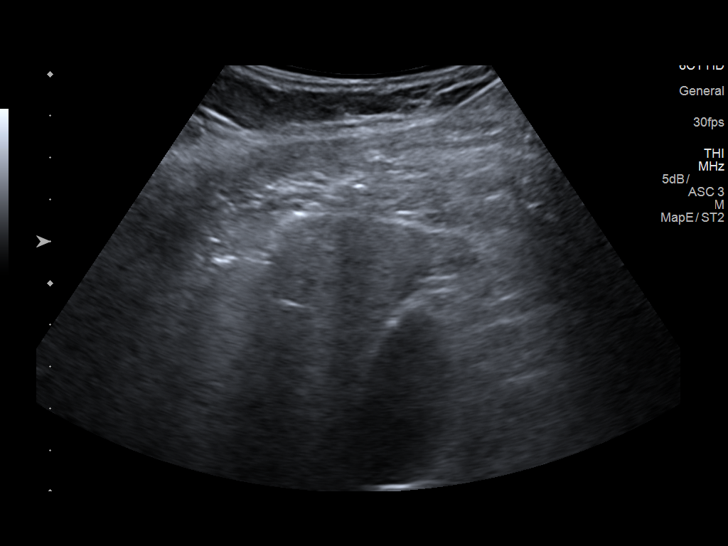
[im 9/10]
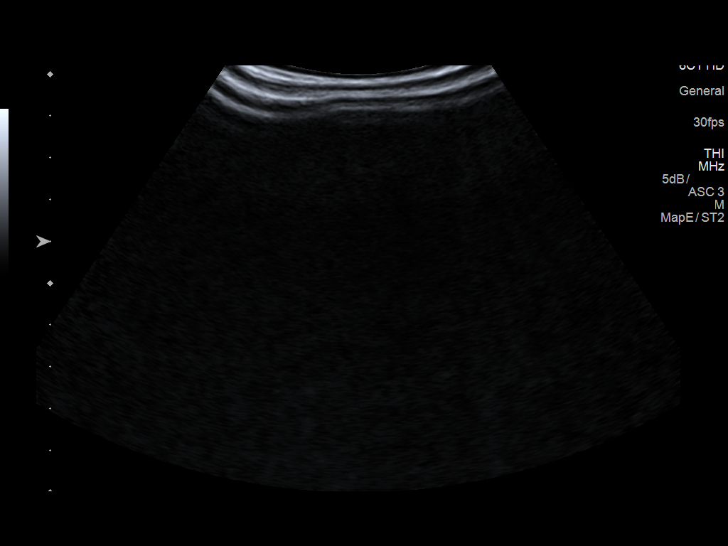
[im 10/10]
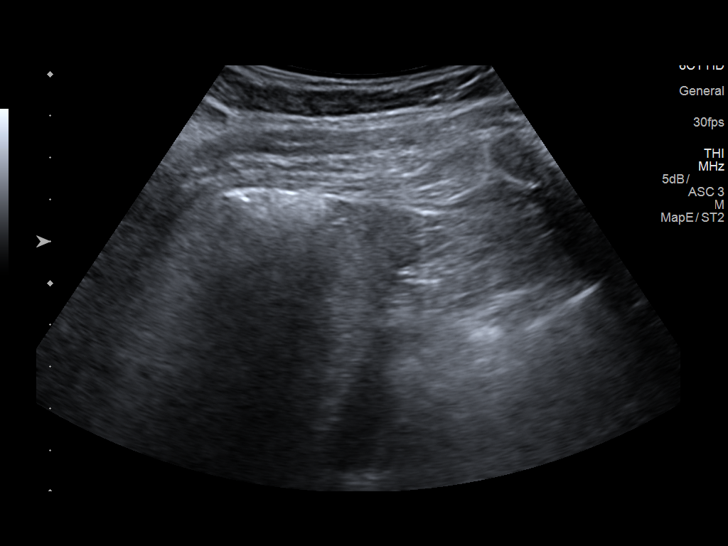

[10 of 10 positions shown; findings below may reference images not displayed]

EXAM:
ULTRASOUND-GUIDED BIOPSY OF LIVER LESION

MEDICATIONS:
None.

ANESTHESIA/SEDATION:
Moderate (conscious) sedation was employed during this procedure. A
total of Versed 1.0 mg and Fentanyl 75 mcg was administered
intravenously.

Moderate Sedation Time: 16 minutes. The patient's level of
consciousness and vital signs were monitored continuously by
radiology nursing throughout the procedure under my direct
supervision.

FLUOROSCOPY TIME:  NONE

COMPLICATIONS:
None immediate.

PROCEDURE:
Informed written consent was obtained from the patient after a
thorough discussion of the procedural risks, benefits and
alternatives. All questions were addressed. Maximal Sterile Barrier
Technique was utilized including caps, mask, sterile gowns, sterile
gloves, sterile drape, hand hygiene and skin antiseptic. A timeout
was performed prior to the initiation of the procedure.

Patient positioned supine position on the ultrasound table. Images
of the upper abdomen were performed with images stored sent to PACs.

The patient is then prepped and draped in the usual sterile fashion.
1% lidocaine was used for local anesthesia.

Using ultrasound guidance, 17 gauge guide needle was advanced into
the right liver, targeting the selected mass target. Once the needle
tip was confirmed, multiple 18 gauge core biopsy were performed.

Gel-Foam was infused upon withdrawal.

Final image was stored.

Patient tolerated the procedure well and remained hemodynamically
stable throughout.

No complications were encountered and no significant blood loss.
FINDINGS: Ultrasound survey demonstrates heterogeneous liver. There is only 1
lesion identified on ultrasound images today's survey. The target is
a 5 mm hypoechoic lesion within the right liver, just subjacent to
the liver capsule.

After biopsy, no complicating features identified.
IMPRESSION: Status post ultrasound-guided biopsy of right liver lobe lesion. The
lesion targeted is 5 mm and was the only lesion identified on
today's ultrasound survey.

## 2019-10-23 ENCOUNTER — Encounter: Payer: Self-pay | Admitting: Internal Medicine

## 2019-10-23 DIAGNOSIS — E118 Type 2 diabetes mellitus with unspecified complications: Secondary | ICD-10-CM

## 2019-10-25 MED ORDER — FREESTYLE LIBRE 14 DAY READER DEVI: 1.0000 | 5 refills | Status: DC

## 2019-10-25 MED ORDER — FREESTYLE LIBRE 14 DAY SENSOR MISC: 5 refills | Status: DC

## 2019-10-26 ENCOUNTER — Telehealth: Payer: Self-pay

## 2019-10-26 NOTE — Telephone Encounter (Signed)
New message   Prior authorization on Dexcom machine.  Henry Lang is stating they have faxed authorization over several times.

## 2019-10-26 NOTE — Telephone Encounter (Signed)
I will start as soon as I have the time.

## 2019-10-27 NOTE — Telephone Encounter (Signed)
Key: [id number])

## 2019-11-01 MED ORDER — BLOOD GLUCOSE MONITOR KIT: PACK | 0 refills | Status: DC

## 2019-11-13 DIAGNOSIS — R739 Hyperglycemia, unspecified: Secondary | ICD-10-CM | POA: Diagnosis not present

## 2019-11-13 DIAGNOSIS — C787 Secondary malignant neoplasm of liver and intrahepatic bile duct: Secondary | ICD-10-CM | POA: Diagnosis not present

## 2019-11-13 DIAGNOSIS — K760 Fatty (change of) liver, not elsewhere classified: Secondary | ICD-10-CM | POA: Diagnosis not present

## 2019-11-13 DIAGNOSIS — Z800 Family history of malignant neoplasm of digestive organs: Secondary | ICD-10-CM | POA: Diagnosis not present

## 2019-11-13 DIAGNOSIS — F1099 Alcohol use, unspecified with unspecified alcohol-induced disorder: Secondary | ICD-10-CM | POA: Diagnosis not present

## 2019-11-13 DIAGNOSIS — C7A00 Malignant carcinoid tumor of unspecified site: Secondary | ICD-10-CM | POA: Diagnosis not present

## 2019-11-13 DIAGNOSIS — R918 Other nonspecific abnormal finding of lung field: Secondary | ICD-10-CM | POA: Diagnosis not present

## 2019-11-13 DIAGNOSIS — C7A019 Malignant carcinoid tumor of the small intestine, unspecified portion: Secondary | ICD-10-CM | POA: Diagnosis not present

## 2019-11-13 DIAGNOSIS — Z7984 Long term (current) use of oral hypoglycemic drugs: Secondary | ICD-10-CM | POA: Diagnosis not present

## 2019-11-13 DIAGNOSIS — Z801 Family history of malignant neoplasm of trachea, bronchus and lung: Secondary | ICD-10-CM | POA: Diagnosis not present

## 2019-11-13 DIAGNOSIS — Z79899 Other long term (current) drug therapy: Secondary | ICD-10-CM | POA: Diagnosis not present

## 2019-11-13 DIAGNOSIS — Z803 Family history of malignant neoplasm of breast: Secondary | ICD-10-CM | POA: Diagnosis not present

## 2019-11-13 DIAGNOSIS — K769 Liver disease, unspecified: Secondary | ICD-10-CM | POA: Diagnosis not present

## 2019-11-26 ENCOUNTER — Other Ambulatory Visit: Payer: Self-pay | Admitting: Internal Medicine

## 2019-12-11 DIAGNOSIS — Z79899 Other long term (current) drug therapy: Secondary | ICD-10-CM | POA: Diagnosis not present

## 2019-12-11 DIAGNOSIS — C7A019 Malignant carcinoid tumor of the small intestine, unspecified portion: Secondary | ICD-10-CM | POA: Diagnosis not present

## 2019-12-11 DIAGNOSIS — C787 Secondary malignant neoplasm of liver and intrahepatic bile duct: Secondary | ICD-10-CM | POA: Diagnosis not present

## 2020-01-01 ENCOUNTER — Other Ambulatory Visit: Payer: Self-pay

## 2020-01-01 ENCOUNTER — Encounter: Payer: Self-pay | Admitting: Internal Medicine

## 2020-01-01 ENCOUNTER — Ambulatory Visit (INDEPENDENT_AMBULATORY_CARE_PROVIDER_SITE_OTHER): Payer: BC Managed Care – PPO | Admitting: Internal Medicine

## 2020-01-01 VITALS — BP 128/90 | HR 71 | Temp 97.9°F | Ht 71.0 in | Wt 215.0 lb

## 2020-01-01 DIAGNOSIS — I10 Essential (primary) hypertension: Secondary | ICD-10-CM

## 2020-01-01 DIAGNOSIS — E118 Type 2 diabetes mellitus with unspecified complications: Secondary | ICD-10-CM

## 2020-01-01 LAB — POCT GLYCOSYLATED HEMOGLOBIN (HGB A1C): Hemoglobin A1C: 6 % — AB (ref 4.0–5.6)

## 2020-01-01 NOTE — Patient Instructions (Signed)
Type 2 Diabetes Mellitus, Diagnosis, Adult Type 2 diabetes (type 2 diabetes mellitus) is a long-term (chronic) disease. In type 2 diabetes, one or both of these problems may be present:  The pancreas does not make enough of a hormone called insulin.  Cells in the body do not respond properly to insulin that the body makes (insulin resistance). Normally, insulin allows blood sugar (glucose) to enter cells in the body. The cells use glucose for energy. Insulin resistance or lack of insulin causes excess glucose to build up in the blood instead of going into cells. As a result, high blood glucose (hyperglycemia) develops. What increases the risk? The following factors may make you more likely to develop type 2 diabetes:  Having a family member with type 2 diabetes.  Being overweight or obese.  Having an inactive (sedentary) lifestyle.  Having been diagnosed with insulin resistance.  Having a history of prediabetes, gestational diabetes, or polycystic ovary syndrome (PCOS).  Being of American-Indian, African-American, Hispanic/Latino, or Asian/Pacific Islander descent. What are the signs or symptoms? In the early stage of this condition, you may not have symptoms. Symptoms develop slowly and may include:  Increased thirst (polydipsia).  Increased hunger(polyphagia).  Increased urination (polyuria).  Increased urination during the night (nocturia).  Unexplained weight loss.  Frequent infections that keep coming back (recurring).  Fatigue.  Weakness.  Vision changes, such as blurry vision.  Cuts or bruises that are slow to heal.  Tingling or numbness in the hands or feet.  Dark patches on the skin (acanthosis nigricans). How is this diagnosed? This condition is diagnosed based on your symptoms, your medical history, a physical exam, and your blood glucose level. Your blood glucose may be checked with one or more of the following blood tests:  A fasting blood glucose (FBG)  test. You will not be allowed to eat (you will fast) for 8 hours or longer before a blood sample is taken.  A random blood glucose test. This test checks blood glucose at any time of day regardless of when you ate.  An A1c (hemoglobin A1c) blood test. This test provides information about blood glucose control over the previous 2-3 months.  An oral glucose tolerance test (OGTT). This test measures your blood glucose at two times: ? After fasting. This is your baseline blood glucose level. ? Two hours after drinking a beverage that contains glucose. You may be diagnosed with type 2 diabetes if:  Your FBG level is 126 mg/dL (7.0 mmol/L) or higher.  Your random blood glucose level is 200 mg/dL (11.1 mmol/L) or higher.  Your A1c level is 6.5% or higher.  Your OGTT result is higher than 200 mg/dL (11.1 mmol/L). These blood tests may be repeated to confirm your diagnosis. How is this treated? Your treatment may be managed by a specialist called an endocrinologist. Type 2 diabetes may be treated by following instructions from your health care provider about:  Making diet and lifestyle changes. This may include: ? Following an individualized nutrition plan that is developed by a diet and nutrition specialist (registered dietitian). ? Exercising regularly. ? Finding ways to manage stress.  Checking your blood glucose level as often as told.  Taking diabetes medicines or insulin daily. This helps to keep your blood glucose levels in the healthy range. ? If you use insulin, you may need to adjust the dosage depending on how physically active you are and what foods you eat. Your health care provider will tell you how to adjust your dosage.    Taking medicines to help prevent complications from diabetes, such as: ? Aspirin. ? Medicine to lower cholesterol. ? Medicine to control blood pressure. Your health care provider will set individualized treatment goals for you. Your goals will be based on  your age, other medical conditions you have, and how you respond to diabetes treatment. Generally, the goal of treatment is to maintain the following blood glucose levels:  Before meals (preprandial): 80-130 mg/dL (4.4-7.2 mmol/L).  After meals (postprandial): below 180 mg/dL (10 mmol/L).  A1c level: less than 7%. Follow these instructions at home: Questions to ask your health care provider  Consider asking the following questions: ? Do I need to meet with a diabetes educator? ? Where can I find a support group for people with diabetes? ? What equipment will I need to manage my diabetes at home? ? What diabetes medicines do I need, and when should I take them? ? How often do I need to check my blood glucose? ? What number can I call if I have questions? ? When is my next appointment? General instructions  Take over-the-counter and prescription medicines only as told by your health care provider.  Keep all follow-up visits as told by your health care provider. This is important.  For more information about diabetes, visit: ? American Diabetes Association (ADA): www.diabetes.org ? American Association of Diabetes Educators (AADE): www.diabeteseducator.org Contact a health care provider if:  Your blood glucose is at or above 240 mg/dL (13.3 mmol/L) for 2 days in a row.  You have been sick or have had a fever for 2 days or longer, and you are not getting better.  You have any of the following problems for more than 6 hours: ? You cannot eat or drink. ? You have nausea and vomiting. ? You have diarrhea. Get help right away if:  Your blood glucose is lower than 54 mg/dL (3.0 mmol/L).  You become confused or you have trouble thinking clearly.  You have difficulty breathing.  You have moderate or large ketone levels in your urine. Summary  Type 2 diabetes (type 2 diabetes mellitus) is a long-term (chronic) disease. In type 2 diabetes, the pancreas does not make enough of a  hormone called insulin, or cells in the body do not respond properly to insulin that the body makes (insulin resistance).  This condition is treated by making diet and lifestyle changes and taking diabetes medicines or insulin.  Your health care provider will set individualized treatment goals for you. Your goals will be based on your age, other medical conditions you have, and how you respond to diabetes treatment.  Keep all follow-up visits as told by your health care provider. This is important. This information is not intended to replace advice given to you by your health care provider. Make sure you discuss any questions you have with your health care provider. Document Revised: 10/29/2017 Document Reviewed: 10/04/2015 Elsevier Patient Education  2020 [employer]  

## 2020-01-01 NOTE — Progress Notes (Signed)
Subjective:  Patient ID: Henry Lang, male    DOB: 10/03/1960  Age: 58 y.o. MRN: [id number]  CC: Hypertension and Diabetes   This visit occurred during the SARS-CoV-2 public health emergency.  Safety protocols were in place, including screening questions prior to the visit, additional usage of staff PPE, and extensive cleaning of exam room while observing appropriate contact time as indicated for disinfecting solutions.    HPI Henry Lang presents for f/up - Based on his Dexcom his average blood sugar over the last month has been 132.  He tells me he feels well and offers no complaints today.  He continues to work on his lifestyle modifications.  He has gained 2 pounds in the last 3 or 4 months.  Outpatient Medications Prior to Visit  Medication Sig Dispense Refill  . acyclovir cream (ZOVIRAX) 5 % Apply topically.    . Continuous Blood Gluc Sensor (DEXCOM G6 SENSOR) MISC USE AS DIRECTED TO TEST BLOOD SUGAR 3 each 5  . Continuous Blood Gluc Transmit (DEXCOM G6 TRANSMITTER) MISC USE AS DIRECTED TO TEST BLOOD SUGAR 1 each 11  . oxyCODONE (OXY IR/ROXICODONE) 5 MG immediate release tablet oxycodone 5 mg tablet    . blood glucose meter kit and supplies KIT Use to check blood sugar. DX E11.8 1 each 0  . Blood Glucose Monitoring Suppl (CONTOUR NEXT ONE) KIT 1 Act by Does not apply route 2 (two) times daily. 2 kit 0  . Insulin Pen Needle (NOVOFINE) 32G X 6 MM MISC 1 Act by Does not apply route daily. 100 each 3   No facility-administered medications prior to visit.    ROS Review of Systems  Constitutional: Negative for diaphoresis, fatigue and unexpected weight change.  HENT: Negative.   Eyes: Negative for visual disturbance.  Respiratory: Negative for cough, chest tightness, shortness of breath and wheezing.   Cardiovascular: Negative for chest pain, palpitations and leg swelling.  Gastrointestinal: Negative for abdominal pain, constipation, diarrhea, nausea and vomiting.  Endocrine:  Negative.  Negative for polydipsia, polyphagia and polyuria.  Genitourinary: Negative.  Negative for difficulty urinating and frequency.  Musculoskeletal: Negative for arthralgias and myalgias.  Skin: Negative.  Negative for color change and pallor.  Neurological: Negative.  Negative for dizziness, weakness and light-headedness.  Hematological: Negative for adenopathy. Does not bruise/bleed easily.  Psychiatric/Behavioral: Negative.     Objective:  BP 128/90 (BP Location: Left Arm, Patient Position: Sitting, Cuff Size: Large)   Pulse 71   Temp 97.9 F (36.6 C) (Oral)   Ht '5\' 11"'  (1.803 m)   Wt 215 lb (97.5 kg)   SpO2 98%   BMI 29.99 kg/m   BP Readings from Last 3 Encounters:  01/01/20 128/90  10/02/19 124/82  09/06/19 114/83    Wt Readings from Last 3 Encounters:  01/01/20 215 lb (97.5 kg)  10/02/19 213 lb (96.6 kg)  09/06/19 223 lb (101.2 kg)    Physical Exam Vitals reviewed.  Constitutional:      Appearance: Normal appearance.  HENT:     Nose: Nose normal.     Mouth/Throat:     Mouth: Mucous membranes are moist.  Eyes:     General: No scleral icterus.    Conjunctiva/sclera: Conjunctivae normal.  Cardiovascular:     Rate and Rhythm: Normal rate and regular rhythm.     Heart sounds: No murmur.  Pulmonary:     Effort: Pulmonary effort is normal.     Breath sounds: No stridor. No wheezing or rhonchi.  Abdominal:     General: Abdomen is flat. Bowel sounds are normal. There is no distension.     Palpations: Abdomen is soft. There is no hepatomegaly, splenomegaly or mass.     Tenderness: There is no abdominal tenderness.  Musculoskeletal:        General: Normal range of motion.     Cervical back: Neck supple.     Right lower leg: No edema.     Left lower leg: No edema.  Lymphadenopathy:     Cervical: No cervical adenopathy.  Skin:    General: Skin is warm and dry.  Neurological:     General: No focal deficit present.     Mental Status: He is alert.    Psychiatric:        Mood and Affect: Mood normal.        Behavior: Behavior normal.     Lab Results  Component Value Date   WBC 6.0 10/02/2019   HGB 14.8 10/02/2019   HCT 43.7 10/02/2019   PLT 204.0 10/02/2019   GLUCOSE 87 10/02/2019   CHOL 123 06/05/2019   TRIG 142.0 06/05/2019   HDL 32.60 (L) 06/05/2019   LDLCALC 62 06/05/2019   ALT 36 10/02/2019   AST 30 10/02/2019   NA 141 10/02/2019   K 3.6 10/02/2019   CL 106 10/02/2019   CREATININE 0.71 10/02/2019   BUN 17 10/02/2019   CO2 24 10/02/2019   TSH 2.74 06/05/2019   PSA 0.47 06/05/2019   INR 0.99 08/04/2018   HGBA1C 6.0 (A) 01/01/2020   MICROALBUR 0.7 06/05/2019    NM PET (NETSPOT GA 44 DOTATATE) SKULL BASE TO MID THIGH  Result Date: 08/30/2018 CLINICAL DATA:  Well differentiated neuroendocrine tumor. EXAM: NUCLEAR MEDICINE PET SKULL BASE TO THIGH TECHNIQUE: mCi Ga 60 DOTATATE was injected intravenously. Full-ring PET imaging was performed from the skull base to thigh after the radiotracer. CT data was obtained and used for attenuation correction and anatomic localization. COMPARISON:  None. FINDINGS: NECK No radiotracer activity in neck lymph nodes. Incidental CT findings: None CHEST No radiotracer accumulation within mediastinal or hilar lymph nodes. No suspicious pulmonary nodules on the CT scan. Incidental CT finding:4 mm RIGHT lower lobe nodule on image 88/4. No associated radiotracer activity. Posterior RIGHT lower lobe nodule measuring 4 mm on image 87/4 also without metabolic activity. Radiotracer activity ABDOMEN/PELVIS Intense radiotracer activity associated with a small mesenteric/small-bowel lesion in the RIGHT abdomen. Lesion is difficult to identify on the noncontrast CT but does localize to a 14 mm lesion adjacent the small bowel on image 147/4. The radiotracer activity is intense with SUV max equal 15.6. Small adjacent mesenteric nodule measuring 8 mm (image 42/4) also with radiotracer activity (SUV max equal  9.7). A third focus of radiotracer activity associated small bowel in the RIGHT abdomen just anterior to the ascending colon with SUV max equal 12.0. No clear CT lesion identified. Lesion found on image 152 of the fused data set. Hepatic metastasis seen on comparison MRI (07/05/2018) are not as clearly depicted on the DOTATATE PET scan. There are 2 lesions in the RIGHT hepatic lobe with radiotracer activity above background measuring approximately 1 cm each on image 101 of the fused data set. SUV max equal 14.8 and 13.8 respectively. Background liver activity is 12.3. Physiologic activity noted in the adrenal glands, spleen and kidneys. Incidental CT findings:None SKELETON No focal activity to suggest skeletal metastasis. Incidental CT findings:None IMPRESSION: 1. Small (1.5) metastatic mesentery lesion versus primary small bowel  lesion in the RIGHT abdomen with intense radiotracer activity. Findings consistent well differentiated neuroendocrine tumor. 2. Small nodular mesenteric implant adjacent to the above dominant lesion. 3. Small implant along the RIGHT abdomen associated with the small bowel without CT correlation. 4. At least 2 hepatic metastasis accumulate the somatostatin specific radiotracer radiotracer. The degree of radiotracer uptake within liver lesions is less than expected for well differentiated neuroendocrine tumor. This may be in part due to high background liver activity. Electronically Signed   By: Suzy Bouchard M.D.   On: 08/30/2018 15:58    Assessment & Plan:   Vicent was seen today for hypertension and diabetes.  Diagnoses and all orders for this visit:  Essential hypertension- His blood pressure is adequately well controlled with lifestyle modifications.  Medical therapy is not indicated.  Type II diabetes mellitus with manifestations ([place])- His A1c remains at 6.0%.  His blood sugars are well controlled.  Medical therapy is not indicated. -     POCT glycosylated hemoglobin (Hb  A1C)   I have discontinued Gomez Cleverly Hagan's Contour Next One, NovoFine, and blood glucose meter kit and supplies. I am also having him maintain his acyclovir cream, oxyCODONE, Dexcom G6 Sensor, and Dexcom G6 Transmitter.  No orders of the defined types were placed in this encounter.    Follow-up: Return in about 6 months (around 07/02/2020).  Scarlette Calico, MD

## 2020-01-08 DIAGNOSIS — C7A019 Malignant carcinoid tumor of the small intestine, unspecified portion: Secondary | ICD-10-CM | POA: Diagnosis not present

## 2020-01-08 DIAGNOSIS — C787 Secondary malignant neoplasm of liver and intrahepatic bile duct: Secondary | ICD-10-CM | POA: Diagnosis not present

## 2020-02-05 DIAGNOSIS — C787 Secondary malignant neoplasm of liver and intrahepatic bile duct: Secondary | ICD-10-CM | POA: Diagnosis not present

## 2020-02-05 DIAGNOSIS — Z8639 Personal history of other endocrine, nutritional and metabolic disease: Secondary | ICD-10-CM | POA: Diagnosis not present

## 2020-02-05 DIAGNOSIS — C7A019 Malignant carcinoid tumor of the small intestine, unspecified portion: Secondary | ICD-10-CM | POA: Diagnosis not present

## 2020-03-04 DIAGNOSIS — Z79899 Other long term (current) drug therapy: Secondary | ICD-10-CM | POA: Diagnosis not present

## 2020-03-04 DIAGNOSIS — C7A019 Malignant carcinoid tumor of the small intestine, unspecified portion: Secondary | ICD-10-CM | POA: Diagnosis not present

## 2020-04-01 DIAGNOSIS — C7A019 Malignant carcinoid tumor of the small intestine, unspecified portion: Secondary | ICD-10-CM | POA: Diagnosis not present

## 2020-04-01 DIAGNOSIS — Z79899 Other long term (current) drug therapy: Secondary | ICD-10-CM | POA: Diagnosis not present

## 2020-04-22 DIAGNOSIS — C7A019 Malignant carcinoid tumor of the small intestine, unspecified portion: Secondary | ICD-10-CM | POA: Diagnosis not present

## 2020-04-22 DIAGNOSIS — C787 Secondary malignant neoplasm of liver and intrahepatic bile duct: Secondary | ICD-10-CM | POA: Diagnosis not present

## 2020-04-22 DIAGNOSIS — R918 Other nonspecific abnormal finding of lung field: Secondary | ICD-10-CM | POA: Diagnosis not present

## 2020-04-29 DIAGNOSIS — Z5111 Encounter for antineoplastic chemotherapy: Secondary | ICD-10-CM | POA: Diagnosis not present

## 2020-04-29 DIAGNOSIS — Z800 Family history of malignant neoplasm of digestive organs: Secondary | ICD-10-CM | POA: Diagnosis not present

## 2020-04-29 DIAGNOSIS — Z85038 Personal history of other malignant neoplasm of large intestine: Secondary | ICD-10-CM | POA: Diagnosis not present

## 2020-04-29 DIAGNOSIS — C787 Secondary malignant neoplasm of liver and intrahepatic bile duct: Secondary | ICD-10-CM | POA: Diagnosis not present

## 2020-04-29 DIAGNOSIS — C7A011 Malignant carcinoid tumor of the jejunum: Secondary | ICD-10-CM | POA: Diagnosis not present

## 2020-04-29 DIAGNOSIS — Z803 Family history of malignant neoplasm of breast: Secondary | ICD-10-CM | POA: Diagnosis not present

## 2020-04-29 DIAGNOSIS — C7B02 Secondary carcinoid tumors of liver: Secondary | ICD-10-CM | POA: Diagnosis not present

## 2020-04-29 DIAGNOSIS — Z801 Family history of malignant neoplasm of trachea, bronchus and lung: Secondary | ICD-10-CM | POA: Diagnosis not present

## 2020-04-29 DIAGNOSIS — C7A019 Malignant carcinoid tumor of the small intestine, unspecified portion: Secondary | ICD-10-CM | POA: Diagnosis not present

## 2020-04-29 DIAGNOSIS — Z9049 Acquired absence of other specified parts of digestive tract: Secondary | ICD-10-CM | POA: Diagnosis not present

## 2020-05-17 DIAGNOSIS — R05 Cough: Secondary | ICD-10-CM | POA: Diagnosis not present

## 2020-05-17 DIAGNOSIS — R5383 Other fatigue: Secondary | ICD-10-CM | POA: Diagnosis not present

## 2020-05-17 DIAGNOSIS — Z20828 Contact with and (suspected) exposure to other viral communicable diseases: Secondary | ICD-10-CM | POA: Diagnosis not present

## 2020-05-17 DIAGNOSIS — R509 Fever, unspecified: Secondary | ICD-10-CM | POA: Diagnosis not present

## 2020-05-23 DIAGNOSIS — C7B00 Secondary carcinoid tumors, unspecified site: Secondary | ICD-10-CM | POA: Diagnosis not present

## 2020-05-23 DIAGNOSIS — U071 COVID-19: Secondary | ICD-10-CM | POA: Diagnosis not present

## 2020-05-23 DIAGNOSIS — R918 Other nonspecific abnormal finding of lung field: Secondary | ICD-10-CM | POA: Diagnosis not present

## 2020-05-23 DIAGNOSIS — C7A019 Malignant carcinoid tumor of the small intestine, unspecified portion: Secondary | ICD-10-CM | POA: Diagnosis not present

## 2020-05-23 DIAGNOSIS — J1282 Pneumonia due to coronavirus disease 2019: Secondary | ICD-10-CM | POA: Diagnosis not present

## 2020-05-23 DIAGNOSIS — Z8505 Personal history of malignant neoplasm of liver: Secondary | ICD-10-CM | POA: Diagnosis not present

## 2020-05-23 DIAGNOSIS — R6520 Severe sepsis without septic shock: Secondary | ICD-10-CM | POA: Diagnosis not present

## 2020-05-23 DIAGNOSIS — J984 Other disorders of lung: Secondary | ICD-10-CM | POA: Diagnosis not present

## 2020-05-23 DIAGNOSIS — R0602 Shortness of breath: Secondary | ICD-10-CM | POA: Diagnosis not present

## 2020-05-23 DIAGNOSIS — Z283 Underimmunization status: Secondary | ICD-10-CM | POA: Diagnosis not present

## 2020-05-23 DIAGNOSIS — J9601 Acute respiratory failure with hypoxia: Secondary | ICD-10-CM | POA: Diagnosis not present

## 2020-05-23 DIAGNOSIS — A4189 Other specified sepsis: Secondary | ICD-10-CM | POA: Diagnosis not present

## 2020-05-23 DIAGNOSIS — E1165 Type 2 diabetes mellitus with hyperglycemia: Secondary | ICD-10-CM | POA: Diagnosis not present

## 2020-05-23 DIAGNOSIS — R509 Fever, unspecified: Secondary | ICD-10-CM | POA: Diagnosis not present

## 2020-05-23 DIAGNOSIS — D649 Anemia, unspecified: Secondary | ICD-10-CM | POA: Diagnosis not present

## 2020-05-24 LAB — HEMOGLOBIN A1C: Hemoglobin A1C: 6.7

## 2020-05-28 LAB — CBC AND DIFFERENTIAL
Hemoglobin: 12.8 — AB (ref 13.5–17.5)
WBC: 10.2

## 2020-06-05 DIAGNOSIS — C7A019 Malignant carcinoid tumor of the small intestine, unspecified portion: Secondary | ICD-10-CM | POA: Diagnosis not present

## 2020-06-05 DIAGNOSIS — Z79899 Other long term (current) drug therapy: Secondary | ICD-10-CM | POA: Diagnosis not present

## 2020-06-05 DIAGNOSIS — C787 Secondary malignant neoplasm of liver and intrahepatic bile duct: Secondary | ICD-10-CM | POA: Diagnosis not present

## 2020-06-05 LAB — BASIC METABOLIC PANEL
BUN: 14 (ref 4–21)
CO2: 29 — AB (ref 13–22)
Chloride: 101 (ref 99–108)
Creatinine: 0.8 (ref 0.6–1.3)
Glucose: 210
Potassium: 3.9 (ref 3.4–5.3)
Sodium: 137 (ref 137–147)

## 2020-06-05 LAB — HEPATIC FUNCTION PANEL
ALT: 21 (ref 10–40)
AST: 12 — AB (ref 14–40)
Alkaline Phosphatase: 61 (ref 25–125)
Bilirubin, Total: 0.5

## 2020-06-05 LAB — CBC AND DIFFERENTIAL
HCT: 37 — AB (ref 41–53)
Platelets: 300 (ref 150–399)

## 2020-06-05 LAB — COMPREHENSIVE METABOLIC PANEL
Albumin: 3.8 (ref 3.5–5.0)
Calcium: 8.9 (ref 8.7–10.7)

## 2020-06-13 ENCOUNTER — Ambulatory Visit (INDEPENDENT_AMBULATORY_CARE_PROVIDER_SITE_OTHER): Payer: BC Managed Care – PPO | Admitting: Internal Medicine

## 2020-06-13 ENCOUNTER — Other Ambulatory Visit: Payer: Self-pay

## 2020-06-13 ENCOUNTER — Encounter: Payer: Self-pay | Admitting: Internal Medicine

## 2020-06-13 VITALS — BP 122/80 | HR 67 | Temp 98.0°F | Ht 71.0 in | Wt 215.6 lb

## 2020-06-13 DIAGNOSIS — E118 Type 2 diabetes mellitus with unspecified complications: Secondary | ICD-10-CM

## 2020-06-13 DIAGNOSIS — I10 Essential (primary) hypertension: Secondary | ICD-10-CM

## 2020-06-13 DIAGNOSIS — E785 Hyperlipidemia, unspecified: Secondary | ICD-10-CM | POA: Diagnosis not present

## 2020-06-13 DIAGNOSIS — Z0000 Encounter for general adult medical examination without abnormal findings: Secondary | ICD-10-CM | POA: Diagnosis not present

## 2020-06-13 DIAGNOSIS — M48061 Spinal stenosis, lumbar region without neurogenic claudication: Secondary | ICD-10-CM

## 2020-06-13 NOTE — Patient Instructions (Signed)

## 2020-06-13 NOTE — Progress Notes (Signed)
Subjective:  Patient ID: Henry Lang, male    DOB: 07/28/1961  Age: 59 y.o. MRN: [id number]  CC: Hospitalization Follow-up, Hyperlipidemia, Hypertension, Annual Exam, and Diabetes  This visit occurred during the SARS-CoV-2 public health emergency.  Safety protocols were in place, including screening questions prior to the visit, additional usage of staff PPE, and extensive cleaning of exam room while observing appropriate contact time as indicated for disinfecting solutions.    HPI Henry Lang presents for a CPX.  He was admitted elsewhere about 3 weeks ago for COVID-19 infection.  He tells me he was in the hospital for 6 days and was treated with steroids, remdesivir, and a monoclonal antibody.  He tells me all of his symptoms have resolved.  His blood sugars were high when he was on the steroids but he said that since he stopped taking the steroids they have normalized.  During the admission his A1c was 6.7%.  Outpatient Medications Prior to Visit  Medication Sig Dispense Refill  . acyclovir cream (ZOVIRAX) 5 % Apply topically.    . Continuous Blood Gluc Sensor (DEXCOM G6 SENSOR) MISC USE AS DIRECTED TO TEST BLOOD SUGAR 3 each 5  . Continuous Blood Gluc Transmit (DEXCOM G6 TRANSMITTER) MISC USE AS DIRECTED TO TEST BLOOD SUGAR 1 each 11  . ibuprofen (ADVIL) 200 MG tablet Take by mouth.    . oxyCODONE (OXY IR/ROXICODONE) 5 MG immediate release tablet oxycodone 5 mg tablet     No facility-administered medications prior to visit.    ROS Review of Systems  Constitutional: Negative.  Negative for chills, diaphoresis, fatigue and fever.  HENT: Negative.  Negative for trouble swallowing.   Eyes: Negative.   Respiratory: Negative for cough, chest tightness, shortness of breath and wheezing.   Cardiovascular: Negative for chest pain, palpitations and leg swelling.  Gastrointestinal: Negative for abdominal pain, constipation, diarrhea, nausea and vomiting.  Endocrine: Negative.  Negative  for polydipsia, polyphagia and polyuria.  Genitourinary: Negative.  Negative for difficulty urinating.  Musculoskeletal: Negative.  Negative for arthralgias and myalgias.  Skin: Negative for color change.  Neurological: Negative.  Negative for dizziness, weakness and light-headedness.  Hematological: Negative for adenopathy. Does not bruise/bleed easily.  Psychiatric/Behavioral: Negative.     Objective:  BP 122/80 (BP Location: Right Arm, Patient Position: Sitting, Cuff Size: Normal)   Pulse 67   Temp 98 F (36.7 C) (Oral)   Ht 5\' 11"  (1.803 m)   Wt 215 lb 9.6 oz (97.8 kg)   SpO2 98%   BMI 30.07 kg/m   BP Readings from Last 3 Encounters:  06/13/20 122/80  01/01/20 128/90  10/02/19 124/82    Wt Readings from Last 3 Encounters:  06/13/20 215 lb 9.6 oz (97.8 kg)  01/01/20 215 lb (97.5 kg)  10/02/19 213 lb (96.6 kg)    Physical Exam Vitals reviewed.  Constitutional:      Appearance: Normal appearance.  HENT:     Nose: Nose normal.     Mouth/Throat:     Mouth: Mucous membranes are moist.  Eyes:     General: No scleral icterus.    Conjunctiva/sclera: Conjunctivae normal.  Cardiovascular:     Rate and Rhythm: Normal rate and regular rhythm.     Heart sounds: No murmur heard.   Pulmonary:     Effort: Pulmonary effort is normal.     Breath sounds: No stridor. No wheezing, rhonchi or rales.  Abdominal:     General: Abdomen is flat. Bowel sounds are normal.  There is no distension.     Palpations: Abdomen is soft. There is no hepatomegaly, splenomegaly or mass.     Tenderness: There is no abdominal tenderness.     Hernia: There is no hernia in the left inguinal area or right inguinal area.  Genitourinary:    Pubic Area: No rash.      Penis: Normal. No swelling or lesions.      Testes: Normal.     Epididymis:     Right: Normal.     Left: Normal.     Prostate: Normal. Not enlarged, not tender and no nodules present.     Rectum: Normal. Guaiac result negative. No mass,  tenderness, anal fissure, external hemorrhoid or internal hemorrhoid. Normal anal tone.  Musculoskeletal:        General: Normal range of motion.     Cervical back: Neck supple.     Right lower leg: No edema.     Left lower leg: No edema.  Lymphadenopathy:     Cervical: No cervical adenopathy.     Lower Body: No right inguinal adenopathy. No left inguinal adenopathy.  Skin:    General: Skin is warm and dry.     Coloration: Skin is not pale.  Neurological:     General: No focal deficit present.     Mental Status: He is alert and oriented to person, place, and time. Mental status is at baseline.  Psychiatric:        Mood and Affect: Mood normal.        Behavior: Behavior normal.     Lab Results  Component Value Date   WBC 10.2 05/28/2020   HGB 12.8 (A) 05/28/2020   HCT 37 (A) 06/05/2020   PLT 300 06/05/2020   GLUCOSE 87 10/02/2019   CHOL 155 06/13/2020   TRIG 139 06/13/2020   HDL 41 06/13/2020   LDLCALC 90 06/13/2020   ALT 21 06/05/2020   AST 12 (A) 06/05/2020   NA 137 06/05/2020   K 3.9 06/05/2020   CL 101 06/05/2020   CREATININE 0.8 06/05/2020   BUN 14 06/05/2020   CO2 29 (A) 06/05/2020   TSH 2.74 06/05/2019   PSA 0.33 06/13/2020   INR 0.99 08/04/2018   HGBA1C 6.7 05/24/2020   MICROALBUR 0.2 06/13/2020    NM PET (NETSPOT GA 68 DOTATATE) SKULL BASE TO MID THIGH  Result Date: 08/30/2018 CLINICAL DATA:  Well differentiated neuroendocrine tumor. EXAM: NUCLEAR MEDICINE PET SKULL BASE TO THIGH TECHNIQUE: mCi Ga 89 DOTATATE was injected intravenously. Full-ring PET imaging was performed from the skull base to thigh after the radiotracer. CT data was obtained and used for attenuation correction and anatomic localization. COMPARISON:  None. FINDINGS: NECK No radiotracer activity in neck lymph nodes. Incidental CT findings: None CHEST No radiotracer accumulation within mediastinal or hilar lymph nodes. No suspicious pulmonary nodules on the CT scan. Incidental CT finding:4 mm  RIGHT lower lobe nodule on image 88/4. No associated radiotracer activity. Posterior RIGHT lower lobe nodule measuring 4 mm on image 87/4 also without metabolic activity. Radiotracer activity ABDOMEN/PELVIS Intense radiotracer activity associated with a small mesenteric/small-bowel lesion in the RIGHT abdomen. Lesion is difficult to identify on the noncontrast CT but does localize to a 14 mm lesion adjacent the small bowel on image 147/4. The radiotracer activity is intense with SUV max equal 15.6. Small adjacent mesenteric nodule measuring 8 mm (image 42/4) also with radiotracer activity (SUV max equal 9.7). A third focus of radiotracer activity associated small bowel  in the RIGHT abdomen just anterior to the ascending colon with SUV max equal 12.0. No clear CT lesion identified. Lesion found on image 152 of the fused data set. Hepatic metastasis seen on comparison MRI (07/05/2018) are not as clearly depicted on the DOTATATE PET scan. There are 2 lesions in the RIGHT hepatic lobe with radiotracer activity above background measuring approximately 1 cm each on image 101 of the fused data set. SUV max equal 14.8 and 13.8 respectively. Background liver activity is 12.3. Physiologic activity noted in the adrenal glands, spleen and kidneys. Incidental CT findings:None SKELETON No focal activity to suggest skeletal metastasis. Incidental CT findings:None IMPRESSION: 1. Small (1.5) metastatic mesentery lesion versus primary small bowel lesion in the RIGHT abdomen with intense radiotracer activity. Findings consistent well differentiated neuroendocrine tumor. 2. Small nodular mesenteric implant adjacent to the above dominant lesion. 3. Small implant along the RIGHT abdomen associated with the small bowel without CT correlation. 4. At least 2 hepatic metastasis accumulate the somatostatin specific radiotracer radiotracer. The degree of radiotracer uptake within liver lesions is less than expected for well differentiated  neuroendocrine tumor. This may be in part due to high background liver activity. Electronically Signed   By: Suzy Bouchard M.D.   On: 08/30/2018 15:58    Assessment & Plan:   Chaze was seen today for hospitalization follow-up, hyperlipidemia, hypertension, annual exam and diabetes.  Diagnoses and all orders for this visit:  Essential hypertension- His blood pressure is adequately well controlled. -     Urinalysis, Routine w reflex microscopic; Future -     Urinalysis, Routine w reflex microscopic -     CBC with Differential/Platelet; Future  Type II diabetes mellitus with manifestations ([place])- His A1c was recently 6.7%.  His blood sugars are adequately well controlled.  Medical therapy is not indicated. -     HM Diabetes Foot Exam -     Microalbumin / creatinine urine ratio; Future -     Urinalysis, Routine w reflex microscopic; Future -     Urinalysis, Routine w reflex microscopic -     Microalbumin / creatinine urine ratio  Hyperlipidemia LDL goal <130  Routine general medical examination at a health care facility- Exam completed, labs reviewed, vaccines reviewed-he refused vaccines against pneumonia and influenza, cancer screenings are up-to-date, patient education was given. -     Lipid panel; Future -     PSA; Future -     PSA -     Lipid panel  Spinal stenosis, lumbar region, without neurogenic claudication  Hyperlipidemia LDL goal <70- I recommend that he take a statin for cardiovascular risk reduction. -     rosuvastatin (CRESTOR) 5 MG tablet; Take 1 tablet (5 mg total) by mouth daily.   I am having Baltazar Apo start on rosuvastatin. I am also having him maintain his acyclovir cream, oxyCODONE, Dexcom G6 Sensor, Dexcom G6 Transmitter, and ibuprofen.  Meds ordered this encounter  Medications  . rosuvastatin (CRESTOR) 5 MG tablet    Sig: Take 1 tablet (5 mg total) by mouth daily.    Dispense:  90 tablet    Refill:  1     Follow-up: Return in about 6 months  (around 12/11/2020).  Scarlette Calico, MD

## 2020-06-14 LAB — URINALYSIS, ROUTINE W REFLEX MICROSCOPIC
Bilirubin Urine: NEGATIVE
Glucose, UA: NEGATIVE
Hgb urine dipstick: NEGATIVE
Ketones, ur: NEGATIVE
Leukocytes,Ua: NEGATIVE
Nitrite: NEGATIVE
Protein, ur: NEGATIVE
Specific Gravity, Urine: 1.008 (ref 1.001–1.03)
pH: 6 (ref 5.0–8.0)

## 2020-06-14 LAB — LIPID PANEL
Cholesterol: 155 mg/dL (ref <200)
HDL: 41 mg/dL (ref >=40)
LDL Cholesterol (Calc): 90 mg/dL (calc)
Non-HDL Cholesterol (Calc): 114 mg/dL (calc) (ref <130)
Total CHOL/HDL Ratio: 3.8 (calc) (ref <5.0)
Triglycerides: 139 mg/dL (ref <150)

## 2020-06-14 LAB — PSA: PSA: 0.33 ng/mL (ref <=4.0)

## 2020-06-14 LAB — MICROALBUMIN / CREATININE URINE RATIO
Creatinine, Urine: 46 mg/dL (ref 20–320)
Microalb Creat Ratio: 4 mcg/mg creat (ref <30)
Microalb, Ur: 0.2 mg/dL

## 2020-06-15 DIAGNOSIS — E785 Hyperlipidemia, unspecified: Secondary | ICD-10-CM | POA: Insufficient documentation

## 2020-06-15 MED ORDER — ROSUVASTATIN CALCIUM 5 MG PO TABS: 5.0000 mg | ORAL_TABLET | Freq: Every day | ORAL | 1 refills | Status: DC

## 2020-06-18 ENCOUNTER — Encounter: Payer: Self-pay | Admitting: Internal Medicine

## 2020-07-02 ENCOUNTER — Ambulatory Visit: Payer: BC Managed Care – PPO | Admitting: Internal Medicine

## 2020-07-10 DIAGNOSIS — C7A011 Malignant carcinoid tumor of the jejunum: Secondary | ICD-10-CM | POA: Diagnosis not present

## 2020-07-10 DIAGNOSIS — R739 Hyperglycemia, unspecified: Secondary | ICD-10-CM | POA: Diagnosis not present

## 2020-08-07 DIAGNOSIS — K56600 Partial intestinal obstruction, unspecified as to cause: Secondary | ICD-10-CM | POA: Diagnosis not present

## 2020-08-07 DIAGNOSIS — Z800 Family history of malignant neoplasm of digestive organs: Secondary | ICD-10-CM | POA: Diagnosis not present

## 2020-08-07 DIAGNOSIS — Z801 Family history of malignant neoplasm of trachea, bronchus and lung: Secondary | ICD-10-CM | POA: Diagnosis not present

## 2020-08-07 DIAGNOSIS — C7B02 Secondary carcinoid tumors of liver: Secondary | ICD-10-CM | POA: Diagnosis not present

## 2020-08-07 DIAGNOSIS — C7A8 Other malignant neuroendocrine tumors: Secondary | ICD-10-CM | POA: Diagnosis not present

## 2020-08-07 DIAGNOSIS — C7A019 Malignant carcinoid tumor of the small intestine, unspecified portion: Secondary | ICD-10-CM | POA: Diagnosis not present

## 2020-08-07 DIAGNOSIS — Z803 Family history of malignant neoplasm of breast: Secondary | ICD-10-CM | POA: Diagnosis not present

## 2020-08-12 ENCOUNTER — Telehealth: Payer: Self-pay

## 2020-08-12 NOTE — Telephone Encounter (Signed)
Approved  Effective from 08/12/2020 through 08/11/2021.

## 2020-08-12 NOTE — Telephone Encounter (Signed)
Key: [id number]

## 2020-09-02 ENCOUNTER — Encounter: Payer: Self-pay | Admitting: Internal Medicine

## 2020-09-03 ENCOUNTER — Other Ambulatory Visit: Payer: Self-pay | Admitting: Internal Medicine

## 2020-09-03 DIAGNOSIS — E118 Type 2 diabetes mellitus with unspecified complications: Secondary | ICD-10-CM

## 2020-09-03 MED ORDER — DEXCOM G6 SENSOR MISC: 5 refills | Status: DC

## 2020-09-04 DIAGNOSIS — C7A019 Malignant carcinoid tumor of the small intestine, unspecified portion: Secondary | ICD-10-CM | POA: Diagnosis not present

## 2020-09-04 DIAGNOSIS — C7A011 Malignant carcinoid tumor of the jejunum: Secondary | ICD-10-CM | POA: Diagnosis not present

## 2020-09-04 DIAGNOSIS — C7B02 Secondary carcinoid tumors of liver: Secondary | ICD-10-CM | POA: Diagnosis not present

## 2020-09-19 ENCOUNTER — Other Ambulatory Visit: Payer: Self-pay | Admitting: Internal Medicine

## 2020-09-19 DIAGNOSIS — E118 Type 2 diabetes mellitus with unspecified complications: Secondary | ICD-10-CM

## 2020-10-02 DIAGNOSIS — C7A019 Malignant carcinoid tumor of the small intestine, unspecified portion: Secondary | ICD-10-CM | POA: Diagnosis not present

## 2020-10-28 DIAGNOSIS — C7B02 Secondary carcinoid tumors of liver: Secondary | ICD-10-CM | POA: Diagnosis not present

## 2020-10-28 DIAGNOSIS — C7A019 Malignant carcinoid tumor of the small intestine, unspecified portion: Secondary | ICD-10-CM | POA: Diagnosis not present

## 2020-10-30 DIAGNOSIS — M160 Bilateral primary osteoarthritis of hip: Secondary | ICD-10-CM | POA: Diagnosis not present

## 2020-10-30 DIAGNOSIS — C7A019 Malignant carcinoid tumor of the small intestine, unspecified portion: Secondary | ICD-10-CM | POA: Diagnosis not present

## 2020-10-30 DIAGNOSIS — C7A011 Malignant carcinoid tumor of the jejunum: Secondary | ICD-10-CM | POA: Diagnosis not present

## 2020-10-30 DIAGNOSIS — Z801 Family history of malignant neoplasm of trachea, bronchus and lung: Secondary | ICD-10-CM | POA: Diagnosis not present

## 2020-10-30 DIAGNOSIS — M25559 Pain in unspecified hip: Secondary | ICD-10-CM | POA: Diagnosis not present

## 2020-10-30 DIAGNOSIS — Z800 Family history of malignant neoplasm of digestive organs: Secondary | ICD-10-CM | POA: Diagnosis not present

## 2020-10-30 DIAGNOSIS — Z803 Family history of malignant neoplasm of breast: Secondary | ICD-10-CM | POA: Diagnosis not present

## 2020-10-30 DIAGNOSIS — M25551 Pain in right hip: Secondary | ICD-10-CM | POA: Diagnosis not present

## 2020-10-30 DIAGNOSIS — M25552 Pain in left hip: Secondary | ICD-10-CM | POA: Diagnosis not present

## 2020-10-30 DIAGNOSIS — Z886 Allergy status to analgesic agent status: Secondary | ICD-10-CM | POA: Diagnosis not present

## 2020-10-30 DIAGNOSIS — K56600 Partial intestinal obstruction, unspecified as to cause: Secondary | ICD-10-CM | POA: Diagnosis not present

## 2020-10-30 DIAGNOSIS — Z9049 Acquired absence of other specified parts of digestive tract: Secondary | ICD-10-CM | POA: Diagnosis not present

## 2020-11-27 DIAGNOSIS — C7A011 Malignant carcinoid tumor of the jejunum: Secondary | ICD-10-CM | POA: Diagnosis not present

## 2020-11-27 DIAGNOSIS — C7A019 Malignant carcinoid tumor of the small intestine, unspecified portion: Secondary | ICD-10-CM | POA: Diagnosis not present

## 2020-11-27 LAB — BASIC METABOLIC PANEL
BUN: 14 (ref 4–21)
CO2: 27 — AB (ref 13–22)
Chloride: 108 (ref 99–108)
Creatinine: 0.8 (ref 0.6–1.3)
Glucose: 105
Potassium: 3.8 (ref 3.4–5.3)
Sodium: 142 (ref 137–147)

## 2020-11-27 LAB — HM DIABETES EYE EXAM

## 2020-11-27 LAB — HEPATIC FUNCTION PANEL
ALT: 23 (ref 10–40)
AST: 17 (ref 14–40)
Alkaline Phosphatase: 55 (ref 25–125)

## 2020-11-27 LAB — CBC AND DIFFERENTIAL
HCT: 40 — AB (ref 41–53)
Hemoglobin: 13.5 (ref 13.5–17.5)
Platelets: 176 (ref 150–399)
WBC: 5.1

## 2020-11-27 LAB — COMPREHENSIVE METABOLIC PANEL
Albumin: 4.6 (ref 3.5–5.0)
Calcium: 9.4 (ref 8.7–10.7)

## 2020-11-27 LAB — CBC: RBC: 4.48 (ref 3.87–5.11)

## 2020-12-10 ENCOUNTER — Other Ambulatory Visit: Payer: Self-pay

## 2020-12-11 ENCOUNTER — Ambulatory Visit: Payer: BC Managed Care – PPO | Admitting: Internal Medicine

## 2020-12-11 ENCOUNTER — Encounter: Payer: Self-pay | Admitting: Internal Medicine

## 2020-12-11 ENCOUNTER — Other Ambulatory Visit: Payer: Self-pay

## 2020-12-11 VITALS — BP 122/84 | HR 88 | Temp 98.0°F | Resp 16 | Ht 71.0 in | Wt 224.0 lb

## 2020-12-11 DIAGNOSIS — I10 Essential (primary) hypertension: Secondary | ICD-10-CM

## 2020-12-11 DIAGNOSIS — Z23 Encounter for immunization: Secondary | ICD-10-CM

## 2020-12-11 DIAGNOSIS — M160 Bilateral primary osteoarthritis of hip: Secondary | ICD-10-CM | POA: Diagnosis not present

## 2020-12-11 DIAGNOSIS — E785 Hyperlipidemia, unspecified: Secondary | ICD-10-CM

## 2020-12-11 DIAGNOSIS — E118 Type 2 diabetes mellitus with unspecified complications: Secondary | ICD-10-CM

## 2020-12-11 LAB — POCT GLYCOSYLATED HEMOGLOBIN (HGB A1C): Hemoglobin A1C: 5.9 % — AB (ref 4.0–5.6)

## 2020-12-11 MED ORDER — MELOXICAM 15 MG PO TABS: 15.0000 mg | ORAL_TABLET | Freq: Every day | ORAL | 1 refills | Status: DC

## 2020-12-11 MED ORDER — ROSUVASTATIN CALCIUM 5 MG PO TABS: 5.0000 mg | ORAL_TABLET | Freq: Every day | ORAL | 1 refills | Status: DC

## 2020-12-11 NOTE — Patient Instructions (Signed)

## 2020-12-11 NOTE — Progress Notes (Signed)
Subjective:  Patient ID: Henry Lang, male    DOB: 02-06-1961  Age: 60 y.o. MRN: [id number]  CC: Diabetes, Hypertension, and Hyperlipidemia  This visit occurred during the SARS-CoV-2 public health emergency.  Safety protocols were in place, including screening questions prior to the visit, additional usage of staff PPE, and extensive cleaning of exam room while observing appropriate contact time as indicated for disinfecting solutions.    HPI Henry Lang presents for f/up -  He complains of chronic hip pain.  He tells me his oncologist recently ordered plain films that showed arthritis in his hips.  He tells me his blood pressure and blood sugar have been well controlled.  He is active and denies any recent episodes of chest pain, shortness of breath, palpitations, edema, or fatigue.  Outpatient Medications Prior to Visit  Medication Sig Dispense Refill  . acyclovir cream (ZOVIRAX) 5 % Apply topically.    . Continuous Blood Gluc Sensor (DEXCOM G6 SENSOR) MISC USE AS DIRECTED TO TEST BLOOD SUGAR 3 each 5  . Continuous Blood Gluc Transmit (DEXCOM G6 TRANSMITTER) MISC USE AS DIRECTED TO TEST BLOOD SUGAR 1 each 11  . ibuprofen (ADVIL) 200 MG tablet Take by mouth.    . oxyCODONE (OXY IR/ROXICODONE) 5 MG immediate release tablet oxycodone 5 mg tablet    . rosuvastatin (CRESTOR) 5 MG tablet Take 1 tablet (5 mg total) by mouth daily. 90 tablet 1   No facility-administered medications prior to visit.    ROS Review of Systems  Constitutional: Negative for chills, diaphoresis, fatigue and fever.  HENT: Negative.   Eyes: Negative.   Respiratory: Negative for cough, chest tightness, shortness of breath and wheezing.   Cardiovascular: Negative for palpitations and leg swelling.  Gastrointestinal: Negative for abdominal pain, constipation, diarrhea, nausea and vomiting.  Endocrine: Negative.   Genitourinary: Negative.  Negative for difficulty urinating.  Musculoskeletal: Positive for  arthralgias. Negative for back pain and myalgias.  Skin: Negative.   Neurological: Negative.  Negative for dizziness, weakness and light-headedness.  Hematological: Negative for adenopathy. Does not bruise/bleed easily.  Psychiatric/Behavioral: Negative.     Objective:  BP 122/84   Pulse 88   Temp 98 F (36.7 C) (Oral)   Resp 16   Ht 5\' 11"  (1.803 m)   Wt 224 lb (101.6 kg)   SpO2 99%   BMI 31.24 kg/m   BP Readings from Last 3 Encounters:  12/11/20 122/84  06/13/20 122/80  01/01/20 128/90    Wt Readings from Last 3 Encounters:  12/11/20 224 lb (101.6 kg)  06/13/20 215 lb 9.6 oz (97.8 kg)  01/01/20 215 lb (97.5 kg)    Physical Exam Vitals reviewed.  HENT:     Nose: Nose normal.     Mouth/Throat:     Mouth: Mucous membranes are moist.  Eyes:     General: No scleral icterus.    Conjunctiva/sclera: Conjunctivae normal.  Cardiovascular:     Rate and Rhythm: Normal rate and regular rhythm.     Heart sounds: No murmur heard.   Pulmonary:     Effort: Pulmonary effort is normal.     Breath sounds: No stridor. No wheezing, rhonchi or rales.  Abdominal:     General: Abdomen is flat. Bowel sounds are normal. There is no distension.     Palpations: Abdomen is soft. There is no hepatomegaly, splenomegaly or mass.     Tenderness: There is no abdominal tenderness.  Musculoskeletal:        General:  Normal range of motion.     Cervical back: Neck supple.     Right lower leg: No edema.     Left lower leg: No edema.  Lymphadenopathy:     Cervical: No cervical adenopathy.  Skin:    General: Skin is warm and dry.     Coloration: Skin is not pale.  Neurological:     General: No focal deficit present.     Mental Status: He is alert.  Psychiatric:        Mood and Affect: Mood normal.        Behavior: Behavior normal.     Lab Results  Component Value Date   WBC 5.1 11/27/2020   HGB 13.5 11/27/2020   HCT 40 (A) 11/27/2020   PLT 176 11/27/2020   GLUCOSE 87 10/02/2019    CHOL 155 06/13/2020   TRIG 139 06/13/2020   HDL 41 06/13/2020   LDLCALC 90 06/13/2020   ALT 23 11/27/2020   AST 17 11/27/2020   NA 142 11/27/2020   K 3.8 11/27/2020   CL 108 11/27/2020   CREATININE 0.8 11/27/2020   BUN 14 11/27/2020   CO2 27 (A) 11/27/2020   TSH 2.74 06/05/2019   PSA 0.33 06/13/2020   INR 0.99 08/04/2018   HGBA1C 5.9 (A) 12/11/2020   MICROALBUR 0.2 06/13/2020    NM PET (NETSPOT GA 68 DOTATATE) SKULL BASE TO MID THIGH  Result Date: 08/30/2018 CLINICAL DATA:  Well differentiated neuroendocrine tumor. EXAM: NUCLEAR MEDICINE PET SKULL BASE TO THIGH TECHNIQUE: mCi Ga 67 DOTATATE was injected intravenously. Full-ring PET imaging was performed from the skull base to thigh after the radiotracer. CT data was obtained and used for attenuation correction and anatomic localization. COMPARISON:  None. FINDINGS: NECK No radiotracer activity in neck lymph nodes. Incidental CT findings: None CHEST No radiotracer accumulation within mediastinal or hilar lymph nodes. No suspicious pulmonary nodules on the CT scan. Incidental CT finding:4 mm RIGHT lower lobe nodule on image 88/4. No associated radiotracer activity. Posterior RIGHT lower lobe nodule measuring 4 mm on image 87/4 also without metabolic activity. Radiotracer activity ABDOMEN/PELVIS Intense radiotracer activity associated with a small mesenteric/small-bowel lesion in the RIGHT abdomen. Lesion is difficult to identify on the noncontrast CT but does localize to a 14 mm lesion adjacent the small bowel on image 147/4. The radiotracer activity is intense with SUV max equal 15.6. Small adjacent mesenteric nodule measuring 8 mm (image 42/4) also with radiotracer activity (SUV max equal 9.7). A third focus of radiotracer activity associated small bowel in the RIGHT abdomen just anterior to the ascending colon with SUV max equal 12.0. No clear CT lesion identified. Lesion found on image 152 of the fused data set. Hepatic metastasis seen on  comparison MRI (07/05/2018) are not as clearly depicted on the DOTATATE PET scan. There are 2 lesions in the RIGHT hepatic lobe with radiotracer activity above background measuring approximately 1 cm each on image 101 of the fused data set. SUV max equal 14.8 and 13.8 respectively. Background liver activity is 12.3. Physiologic activity noted in the adrenal glands, spleen and kidneys. Incidental CT findings:None SKELETON No focal activity to suggest skeletal metastasis. Incidental CT findings:None IMPRESSION: 1. Small (1.5) metastatic mesentery lesion versus primary small bowel lesion in the RIGHT abdomen with intense radiotracer activity. Findings consistent well differentiated neuroendocrine tumor. 2. Small nodular mesenteric implant adjacent to the above dominant lesion. 3. Small implant along the RIGHT abdomen associated with the small bowel without CT correlation. 4. At  least 2 hepatic metastasis accumulate the somatostatin specific radiotracer radiotracer. The degree of radiotracer uptake within liver lesions is less than expected for well differentiated neuroendocrine tumor. This may be in part due to high background liver activity. Electronically Signed   By: Suzy Bouchard M.D.   On: 08/30/2018 15:58    Assessment & Plan:   Eagle was seen today for diabetes, hypertension and hyperlipidemia.  Diagnoses and all orders for this visit:  Primary osteoarthritis of both hips -     meloxicam (MOBIC) 15 MG tablet; Take 1 tablet (15 mg total) by mouth daily.  Hyperlipidemia LDL goal <70 -     rosuvastatin (CRESTOR) 5 MG tablet; Take 1 tablet (5 mg total) by mouth daily.  Essential hypertension- His blood pressure is adequately well controlled.  Recent electrolytes and renal function were normal.  Type II diabetes mellitus with manifestations ([place])- His blood sugar is well controlled with lifestyle modifications. -     POCT glycosylated hemoglobin (Hb A1C)  Need for pneumococcal vaccination -      Pneumococcal conjugate vaccine 20-valent  Other orders -     Varicella-zoster vaccine IM (Shingrix)   I have discontinued Gomez Cleverly. Habibi's oxyCODONE, Dexcom G6 Transmitter, ibuprofen, and Dexcom G6 Sensor. I am also having him start on meloxicam. Additionally, I am having him maintain his acyclovir cream and rosuvastatin.  Meds ordered this encounter  Medications  . meloxicam (MOBIC) 15 MG tablet    Sig: Take 1 tablet (15 mg total) by mouth daily.    Dispense:  90 tablet    Refill:  1  . rosuvastatin (CRESTOR) 5 MG tablet    Sig: Take 1 tablet (5 mg total) by mouth daily.    Dispense:  90 tablet    Refill:  1     Follow-up: Return in about 6 months (around 06/13/2021).  Scarlette Calico, MD

## 2020-12-13 ENCOUNTER — Encounter: Payer: Self-pay | Admitting: Internal Medicine

## 2020-12-25 DIAGNOSIS — C7A011 Malignant carcinoid tumor of the jejunum: Secondary | ICD-10-CM | POA: Diagnosis not present

## 2021-01-22 DIAGNOSIS — Z800 Family history of malignant neoplasm of digestive organs: Secondary | ICD-10-CM | POA: Diagnosis not present

## 2021-01-22 DIAGNOSIS — C7A011 Malignant carcinoid tumor of the jejunum: Secondary | ICD-10-CM | POA: Diagnosis not present

## 2021-01-22 DIAGNOSIS — Z803 Family history of malignant neoplasm of breast: Secondary | ICD-10-CM | POA: Diagnosis not present

## 2021-01-22 DIAGNOSIS — M25552 Pain in left hip: Secondary | ICD-10-CM | POA: Diagnosis not present

## 2021-01-22 DIAGNOSIS — Z801 Family history of malignant neoplasm of trachea, bronchus and lung: Secondary | ICD-10-CM | POA: Diagnosis not present

## 2021-01-22 DIAGNOSIS — C7B02 Secondary carcinoid tumors of liver: Secondary | ICD-10-CM | POA: Diagnosis not present

## 2021-01-22 DIAGNOSIS — M25551 Pain in right hip: Secondary | ICD-10-CM | POA: Diagnosis not present

## 2021-02-19 DIAGNOSIS — C7A011 Malignant carcinoid tumor of the jejunum: Secondary | ICD-10-CM | POA: Diagnosis not present

## 2021-03-05 ENCOUNTER — Other Ambulatory Visit: Payer: Self-pay | Admitting: Internal Medicine

## 2021-03-05 DIAGNOSIS — M160 Bilateral primary osteoarthritis of hip: Secondary | ICD-10-CM

## 2021-03-19 DIAGNOSIS — C7A011 Malignant carcinoid tumor of the jejunum: Secondary | ICD-10-CM | POA: Diagnosis not present

## 2021-04-15 DIAGNOSIS — C7A011 Malignant carcinoid tumor of the jejunum: Secondary | ICD-10-CM | POA: Diagnosis not present

## 2021-04-15 DIAGNOSIS — C7B02 Secondary carcinoid tumors of liver: Secondary | ICD-10-CM | POA: Diagnosis not present

## 2021-04-16 DIAGNOSIS — C7A011 Malignant carcinoid tumor of the jejunum: Secondary | ICD-10-CM | POA: Diagnosis not present

## 2021-04-16 DIAGNOSIS — C7A00 Malignant carcinoid tumor of unspecified site: Secondary | ICD-10-CM | POA: Diagnosis not present

## 2021-05-14 DIAGNOSIS — C7A011 Malignant carcinoid tumor of the jejunum: Secondary | ICD-10-CM | POA: Diagnosis not present

## 2021-06-11 DIAGNOSIS — C7A011 Malignant carcinoid tumor of the jejunum: Secondary | ICD-10-CM | POA: Diagnosis not present

## 2021-06-16 ENCOUNTER — Ambulatory Visit (INDEPENDENT_AMBULATORY_CARE_PROVIDER_SITE_OTHER): Payer: BC Managed Care – PPO | Admitting: Internal Medicine

## 2021-06-16 ENCOUNTER — Encounter: Payer: Self-pay | Admitting: Internal Medicine

## 2021-06-16 ENCOUNTER — Other Ambulatory Visit: Payer: Self-pay

## 2021-06-16 VITALS — BP 120/78 | HR 78 | Temp 98.7°F | Resp 16 | Ht 71.0 in | Wt 232.0 lb

## 2021-06-16 DIAGNOSIS — Z0000 Encounter for general adult medical examination without abnormal findings: Secondary | ICD-10-CM

## 2021-06-16 DIAGNOSIS — I10 Essential (primary) hypertension: Secondary | ICD-10-CM

## 2021-06-16 DIAGNOSIS — E785 Hyperlipidemia, unspecified: Secondary | ICD-10-CM | POA: Diagnosis not present

## 2021-06-16 DIAGNOSIS — Z125 Encounter for screening for malignant neoplasm of prostate: Secondary | ICD-10-CM | POA: Diagnosis not present

## 2021-06-16 DIAGNOSIS — E118 Type 2 diabetes mellitus with unspecified complications: Secondary | ICD-10-CM | POA: Diagnosis not present

## 2021-06-16 DIAGNOSIS — M720 Palmar fascial fibromatosis [Dupuytren]: Secondary | ICD-10-CM | POA: Diagnosis not present

## 2021-06-16 LAB — CBC WITH DIFFERENTIAL/PLATELET
Basophils Absolute: 0 10*3/uL (ref 0.0–0.1)
Basophils Relative: 0.4 % (ref 0.0–3.0)
Eosinophils Absolute: 0.1 10*3/uL (ref 0.0–0.7)
Eosinophils Relative: 1.1 % (ref 0.0–5.0)
HCT: 39.7 % (ref 39.0–52.0)
Hemoglobin: 13.4 g/dL (ref 13.0–17.0)
Lymphocytes Relative: 31.2 % (ref 12.0–46.0)
Lymphs Abs: 1.9 10*3/uL (ref 0.7–4.0)
MCHC: 33.7 g/dL (ref 30.0–36.0)
MCV: 89.1 fl (ref 78.0–100.0)
Monocytes Absolute: 0.4 10*3/uL (ref 0.1–1.0)
Monocytes Relative: 6.2 % (ref 3.0–12.0)
Neutro Abs: 3.7 10*3/uL (ref 1.4–7.7)
Neutrophils Relative %: 61.1 % (ref 43.0–77.0)
Platelets: 170 10*3/uL (ref 150.0–400.0)
RBC: 4.46 Mil/uL (ref 4.22–5.81)
RDW: 13.7 % (ref 11.5–15.5)
WBC: 6.1 10*3/uL (ref 4.0–10.5)

## 2021-06-16 LAB — URINALYSIS, ROUTINE W REFLEX MICROSCOPIC
Bilirubin Urine: NEGATIVE
Hgb urine dipstick: NEGATIVE
Ketones, ur: NEGATIVE
Leukocytes,Ua: NEGATIVE
Nitrite: NEGATIVE
RBC / HPF: NONE SEEN (ref 0–?)
Specific Gravity, Urine: 1.025 (ref 1.000–1.030)
Total Protein, Urine: NEGATIVE
Urine Glucose: 500 — AB
Urobilinogen, UA: 2 — AB (ref 0.0–1.0)
pH: 6 (ref 5.0–8.0)

## 2021-06-16 LAB — BASIC METABOLIC PANEL
BUN: 15 mg/dL (ref 6–23)
CO2: 24 mEq/L (ref 19–32)
Calcium: 9.2 mg/dL (ref 8.4–10.5)
Chloride: 106 mEq/L (ref 96–112)
Creatinine, Ser: 0.77 mg/dL (ref 0.40–1.50)
GFR: 98.47 mL/min (ref >60.00)
Glucose, Bld: 96 mg/dL (ref 70–99)
Potassium: 3.7 mEq/L (ref 3.5–5.1)
Sodium: 141 mEq/L (ref 135–145)

## 2021-06-16 LAB — HEPATIC FUNCTION PANEL
ALT: 31 U/L (ref 0–53)
AST: 24 U/L (ref 0–37)
Albumin: 4.5 g/dL (ref 3.5–5.2)
Alkaline Phosphatase: 48 U/L (ref 39–117)
Bilirubin, Direct: 0.2 mg/dL (ref 0.0–0.3)
Total Bilirubin: 0.6 mg/dL (ref 0.2–1.2)
Total Protein: 6.6 g/dL (ref 6.0–8.3)

## 2021-06-16 LAB — LIPID PANEL
Cholesterol: 76 mg/dL (ref 0–200)
HDL: 38.5 mg/dL — ABNORMAL LOW (ref >39.00)
LDL Cholesterol: 23 mg/dL (ref 0–99)
NonHDL: 37.79
Total CHOL/HDL Ratio: 2
Triglycerides: 73 mg/dL (ref 0.0–149.0)
VLDL: 14.6 mg/dL (ref 0.0–40.0)

## 2021-06-16 LAB — TSH: TSH: 3.92 u[IU]/mL (ref 0.35–5.50)

## 2021-06-16 LAB — PSA: PSA: 0.35 ng/mL (ref 0.10–4.00)

## 2021-06-16 LAB — CK: Total CK: 143 U/L (ref 7–232)

## 2021-06-16 LAB — HEMOGLOBIN A1C: Hgb A1c MFr Bld: 6.8 % — ABNORMAL HIGH (ref 4.6–6.5)

## 2021-06-16 MED ORDER — RYBELSUS 3 MG PO TABS: 3.0000 mg | ORAL_TABLET | Freq: Every day | ORAL | 0 refills | Status: DC

## 2021-06-16 MED ORDER — ROSUVASTATIN CALCIUM 5 MG PO TABS
5.0000 mg | ORAL_TABLET | Freq: Every day | ORAL | 1 refills | Status: DC
Start: 2021-06-16 — End: 2022-01-02

## 2021-06-16 NOTE — Patient Instructions (Signed)
Health Maintenance, Male Adopting a healthy lifestyle and getting preventive care are important in promoting health and wellness. Ask your health care provider about: The right schedule for you to have regular tests and exams. Things you can do on your own to prevent diseases and keep yourself healthy. What should I know about diet, weight, and exercise? Eat a healthy diet  Eat a diet that includes plenty of vegetables, fruits, low-fat dairy products, and lean protein. Do not eat a lot of foods that are high in solid fats, added sugars, or sodium. Maintain a healthy weight Body mass index (BMI) is a measurement that can be used to identify possible weight problems. It estimates body fat based on height and weight. Your health care provider can help determine your BMI and help you achieve or maintain a healthy weight. Get regular exercise Get regular exercise. This is one of the most important things you can do for your health. Most adults should: Exercise for at least 150 minutes each week. The exercise should increase your heart rate and make you sweat (moderate-intensity exercise). Do strengthening exercises at least twice a week. This is in addition to the moderate-intensity exercise. Spend less time sitting. Even light physical activity can be beneficial. Watch cholesterol and blood lipids Have your blood tested for lipids and cholesterol at 59 years of age, then have this test every 5 years. You may need to have your cholesterol levels checked more often if: Your lipid or cholesterol levels are high. You are older than 59 years of age. You are at high risk for heart disease. What should I know about cancer screening? Many types of cancers can be detected early and may often be prevented. Depending on your health history and family history, you may need to have cancer screening at various ages. This may include screening for: Colorectal cancer. Prostate cancer. Skin cancer. Lung  cancer. What should I know about heart disease, diabetes, and high blood pressure? Blood pressure and heart disease High blood pressure causes heart disease and increases the risk of stroke. This is more likely to develop in people who have high blood pressure readings, are of African descent, or are overweight. Talk with your health care provider about your target blood pressure readings. Have your blood pressure checked: Every 3-5 years if you are 18-39 years of age. Every year if you are 40 years old or older. If you are between the ages of 65 and 75 and are a current or former smoker, ask your health care provider if you should have a one-time screening for abdominal aortic aneurysm (AAA). Diabetes Have regular diabetes screenings. This checks your fasting blood sugar level. Have the screening done: Once every three years after age 45 if you are at a normal weight and have a low risk for diabetes. More often and at a younger age if you are overweight or have a high risk for diabetes. What should I know about preventing infection? Hepatitis B If you have a higher risk for hepatitis B, you should be screened for this virus. Talk with your health care provider to find out if you are at risk for hepatitis B infection. Hepatitis C Blood testing is recommended for: Everyone born from 1945 through 1965. Anyone with known risk factors for hepatitis C. Sexually transmitted infections (STIs) You should be screened each year for STIs, including gonorrhea and chlamydia, if: You are sexually active and are younger than 59 years of age. You are older than 59 years   of age and your health care provider tells you that you are at risk for this type of infection. Your sexual activity has changed since you were last screened, and you are at increased risk for chlamydia or gonorrhea. Ask your health care provider if you are at risk. Ask your health care provider about whether you are at high risk for HIV.  Your health care provider may recommend a prescription medicine to help prevent HIV infection. If you choose to take medicine to prevent HIV, you should first get tested for HIV. You should then be tested every 3 months for as long as you are taking the medicine. Follow these instructions at home: Lifestyle Do not use any products that contain nicotine or tobacco, such as cigarettes, e-cigarettes, and chewing tobacco. If you need help quitting, ask your health care provider. Do not use street drugs. Do not share needles. Ask your health care provider for help if you need support or information about quitting drugs. Alcohol use Do not drink alcohol if your health care provider tells you not to drink. If you drink alcohol: Limit how much you have to 0-2 drinks a day. Be aware of how much alcohol is in your drink. In the [place] one drink equals one 12 oz bottle of beer (355 mL), one 5 oz glass of wine (148 mL), or one 1 oz glass of hard liquor (44 mL). General instructions Schedule regular health, dental, and eye exams. Stay current with your vaccines. Tell your health care provider if: You often feel depressed. You have ever been abused or do not feel safe at home. Summary Adopting a healthy lifestyle and getting preventive care are important in promoting health and wellness. Follow your health care provider's instructions about healthy diet, exercising, and getting tested or screened for diseases. Follow your health care provider's instructions on monitoring your cholesterol and blood pressure. This information is not intended to replace advice given to you by your health care provider. Make sure you discuss any questions you have with your health care provider. Document Revised: 11/08/2020 Document Reviewed: 08/24/2018 Elsevier Patient Education  2022 [employer] Inc[employer]  

## 2021-06-16 NOTE — Progress Notes (Signed)
Subjective:  Patient ID: Henry Lang, male    DOB: 1960-12-15  Age: 60 y.o. MRN: [id number]  CC: Annual Exam  This visit occurred during the SARS-CoV-2 public health emergency.  Safety protocols were in place, including screening questions prior to the visit, additional usage of staff PPE, and extensive cleaning of exam room while observing appropriate contact time as indicated for disinfecting solutions.    HPI Henry Lang presents for a CPX and f/up -   He continues to complain of arthralgias in his large joints (shoulders and hips). He does not experience myalgias.  He is active and denies chest pain, shortness of breath, diaphoresis, dizziness, lightheadedness, or edema.  Outpatient Medications Prior to Visit  Medication Sig Dispense Refill   acyclovir cream (ZOVIRAX) 5 % Apply topically.     meloxicam (MOBIC) 15 MG tablet TAKE 1 TABLET(15 MG) BY MOUTH DAILY 90 tablet 1   rosuvastatin (CRESTOR) 5 MG tablet Take 1 tablet (5 mg total) by mouth daily. 90 tablet 1   No facility-administered medications prior to visit.    ROS Review of Systems  Constitutional:  Positive for unexpected weight change (wt gain). Negative for appetite change, chills, diaphoresis and fatigue.  HENT: Negative.    Eyes: Negative.   Respiratory:  Negative for cough, chest tightness, shortness of breath and wheezing.   Cardiovascular:  Negative for chest pain, palpitations and leg swelling.  Gastrointestinal:  Negative for abdominal pain, constipation, diarrhea and nausea.  Endocrine: Negative.   Genitourinary: Negative.  Negative for difficulty urinating, dysuria, scrotal swelling and testicular pain.  Musculoskeletal:  Positive for arthralgias. Negative for back pain, myalgias and neck pain.  Skin: Negative.   Neurological:  Negative for dizziness, weakness, light-headedness and headaches.  Hematological:  Negative for adenopathy. Does not bruise/bleed easily.  Psychiatric/Behavioral: Negative.      Objective:  BP 120/78 (BP Location: Left Arm, Patient Position: Sitting, Cuff Size: Large)   Pulse 78   Temp 98.7 F (37.1 C) (Oral)   Resp 16   Ht 5\' 11"  (1.803 m)   Wt 232 lb (105.2 kg)   SpO2 98%   BMI 32.36 kg/m   BP Readings from Last 3 Encounters:  06/16/21 120/78  12/11/20 122/84  06/13/20 122/80    Wt Readings from Last 3 Encounters:  06/16/21 232 lb (105.2 kg)  12/11/20 224 lb (101.6 kg)  06/13/20 215 lb 9.6 oz (97.8 kg)    Physical Exam Vitals reviewed.  Constitutional:      Appearance: He is obese.  HENT:     Nose: Nose normal.     Mouth/Throat:     Mouth: Mucous membranes are moist.  Eyes:     General: No scleral icterus.    Conjunctiva/sclera: Conjunctivae normal.  Cardiovascular:     Rate and Rhythm: Normal rate and regular rhythm.     Heart sounds: No murmur heard. Pulmonary:     Effort: Pulmonary effort is normal.     Breath sounds: No stridor. No wheezing, rhonchi or rales.  Abdominal:     General: Abdomen is flat. Bowel sounds are normal.     Palpations: There is no hepatomegaly, splenomegaly or mass.     Tenderness: There is no abdominal tenderness. There is no guarding.     Hernia: No hernia is present. There is no hernia in the left inguinal area or right inguinal area.  Genitourinary:    Pubic Area: No rash.      Penis: Normal.  Testes: Normal.     Epididymis:     Right: Normal.     Left: Normal.     Prostate: Normal. Not enlarged, not tender and no nodules present.     Rectum: Normal. Guaiac result negative. No mass, tenderness, anal fissure, external hemorrhoid or internal hemorrhoid. Normal anal tone.  Musculoskeletal:        General: Normal range of motion.     Cervical back: Neck supple.     Right lower leg: No edema.     Left lower leg: No edema.     Comments: Contractures on the palmar sides of both hands.  Lymphadenopathy:     Cervical: No cervical adenopathy.     Lower Body: No right inguinal adenopathy. No left  inguinal adenopathy.  Skin:    General: Skin is warm and dry.     Coloration: Skin is not pale.  Neurological:     General: No focal deficit present.     Mental Status: He is alert. Mental status is at baseline.  Psychiatric:        Mood and Affect: Mood normal.        Behavior: Behavior normal.    Lab Results  Component Value Date   WBC 6.1 06/16/2021   HGB 13.4 06/16/2021   HCT 39.7 06/16/2021   PLT 170.0 06/16/2021   GLUCOSE 96 06/16/2021   CHOL 76 06/16/2021   TRIG 73.0 06/16/2021   HDL 38.50 (L) 06/16/2021   LDLCALC 23 06/16/2021   ALT 31 06/16/2021   AST 24 06/16/2021   NA 141 06/16/2021   K 3.7 06/16/2021   CL 106 06/16/2021   CREATININE 0.77 06/16/2021   BUN 15 06/16/2021   CO2 24 06/16/2021   TSH 3.92 06/16/2021   PSA 0.35 06/16/2021   INR 0.99 08/04/2018   HGBA1C 6.8 (H) 06/16/2021   MICROALBUR 0.2 06/13/2020    NM PET (NETSPOT GA 68 DOTATATE) SKULL BASE TO MID THIGH  Result Date: 08/30/2018 CLINICAL DATA:  Well differentiated neuroendocrine tumor. EXAM: NUCLEAR MEDICINE PET SKULL BASE TO THIGH TECHNIQUE: mCi Ga 48 DOTATATE was injected intravenously. Full-ring PET imaging was performed from the skull base to thigh after the radiotracer. CT data was obtained and used for attenuation correction and anatomic localization. COMPARISON:  None. FINDINGS: NECK No radiotracer activity in neck lymph nodes. Incidental CT findings: None CHEST No radiotracer accumulation within mediastinal or hilar lymph nodes. No suspicious pulmonary nodules on the CT scan. Incidental CT finding:4 mm RIGHT lower lobe nodule on image 88/4. No associated radiotracer activity. Posterior RIGHT lower lobe nodule measuring 4 mm on image 87/4 also without metabolic activity. Radiotracer activity ABDOMEN/PELVIS Intense radiotracer activity associated with a small mesenteric/small-bowel lesion in the RIGHT abdomen. Lesion is difficult to identify on the noncontrast CT but does localize to a 14 mm  lesion adjacent the small bowel on image 147/4. The radiotracer activity is intense with SUV max equal 15.6. Small adjacent mesenteric nodule measuring 8 mm (image 42/4) also with radiotracer activity (SUV max equal 9.7). A third focus of radiotracer activity associated small bowel in the RIGHT abdomen just anterior to the ascending colon with SUV max equal 12.0. No clear CT lesion identified. Lesion found on image 152 of the fused data set. Hepatic metastasis seen on comparison MRI (07/05/2018) are not as clearly depicted on the DOTATATE PET scan. There are 2 lesions in the RIGHT hepatic lobe with radiotracer activity above background measuring approximately 1 cm each on image 101  of the fused data set. SUV max equal 14.8 and 13.8 respectively. Background liver activity is 12.3. Physiologic activity noted in the adrenal glands, spleen and kidneys. Incidental CT findings:None SKELETON No focal activity to suggest skeletal metastasis. Incidental CT findings:None IMPRESSION: 1. Small (1.5) metastatic mesentery lesion versus primary small bowel lesion in the RIGHT abdomen with intense radiotracer activity. Findings consistent well differentiated neuroendocrine tumor. 2. Small nodular mesenteric implant adjacent to the above dominant lesion. 3. Small implant along the RIGHT abdomen associated with the small bowel without CT correlation. 4. At least 2 hepatic metastasis accumulate the somatostatin specific radiotracer radiotracer. The degree of radiotracer uptake within liver lesions is less than expected for well differentiated neuroendocrine tumor. This may be in part due to high background liver activity. Electronically Signed   By: Suzy Bouchard M.D.   On: 08/30/2018 15:58    Assessment & Plan:   Hawkins was seen today for annual exam.  Diagnoses and all orders for this visit:  Essential hypertension- His blood pressure is well controlled with lifestyle modifications. -     CBC with Differential/Platelet;  Future -     Basic metabolic panel; Future -     Hepatic function panel; Future -     TSH; Future -     Urinalysis, Routine w reflex microscopic; Future -     Urinalysis, Routine w reflex microscopic -     TSH -     Hepatic function panel -     Basic metabolic panel -     CBC with Differential/Platelet  Type II diabetes mellitus with manifestations ([place])- His A1c is up to 6.8%.  I recommend that he start taking a GLP-1 agonist. -     Basic metabolic panel; Future -     Hepatic function panel; Future -     Hemoglobin A1c; Future -     Hemoglobin A1c -     Hepatic function panel -     Basic metabolic panel -     Semaglutide (RYBELSUS) 3 MG TABS; Take 3 mg by mouth daily. -     HM Diabetes Foot Exam  Hyperlipidemia LDL goal <70- LDL goal achieved. Doing well on the statin  -     Hepatic function panel; Future -     TSH; Future -     CK; Future -     CK -     TSH -     Hepatic function panel -     rosuvastatin (CRESTOR) 5 MG tablet; Take 1 tablet (5 mg total) by mouth daily.  Routine general medical examination at a health care facility- Exam completed, labs reviewed, vaccines reviewed - He refused a flu vaccine, cancer screenings are up-to-date, patient education was given. -     Lipid panel; Future -     PSA; Future -     PSA -     Lipid panel  Dupuytren's contracture of hand -     Ambulatory referral to Orthopedic Surgery  I am having Henry Lang start on Rybelsus. I am also having him maintain his acyclovir cream, meloxicam, and rosuvastatin.  Meds ordered this encounter  Medications   Semaglutide (RYBELSUS) 3 MG TABS    Sig: Take 3 mg by mouth daily.    Dispense:  30 tablet    Refill:  0   rosuvastatin (CRESTOR) 5 MG tablet    Sig: Take 1 tablet (5 mg total) by mouth daily.    Dispense:  90  tablet    Refill:  1      Follow-up: Return in about 6 months (around 12/15/2021).  Scarlette Calico, MD

## 2021-06-24 ENCOUNTER — Ambulatory Visit: Payer: BC Managed Care – PPO | Admitting: Orthopedic Surgery

## 2021-06-24 ENCOUNTER — Other Ambulatory Visit: Payer: Self-pay

## 2021-06-24 VITALS — BP 137/87 | HR 72 | Ht 71.0 in | Wt 232.0 lb

## 2021-06-24 DIAGNOSIS — M720 Palmar fascial fibromatosis [Dupuytren]: Secondary | ICD-10-CM | POA: Diagnosis not present

## 2021-06-24 NOTE — Progress Notes (Signed)
Office Visit Note   Patient: Henry Lang           Date of Birth: 10-Sep-1961           MRN: [id number] Visit Date: 06/24/2021              Requested by: Janith Lima, MD [address] [place],  [place] [address] PCP: Janith Lima, MD   Assessment & Plan: Visit Diagnoses:  1. Dupuytren's contracture of hand     Plan: Discussed with patient that he likely has early Dupuytre'ns disease of both hands.  He has no family history of Dupuytren's.  He has no contracture or cords in other locations.  He is very active with his hands and has no difficulty with his work or hobbies.  We discussed the natural history and prognosis for Dupuytren's disease.  At this point, given his lack of symptoms and digital contractures, we will continue to monitor his hands.  He will come back and see me if he develops contractures or has worsening symptoms.   Follow-Up Instructions: No follow-ups on file.   Orders:  No orders of the defined types were placed in this encounter.  No orders of the defined types were placed in this encounter.     Procedures: No procedures performed   Clinical Data: No additional findings.   Subjective: Chief Complaint  Patient presents with   Left Hand - Follow-up    Dupuytrens Contracture    This is a 59 yo RHD M who presents with nodules in bilateral hands.  He noticed them years ago.  He has palpable nodules along the palmar aspect of the left hand in line with the small finger ray.  He has no palpable cords with no digital contractures.  He has more subtle nodules involving the right palm in line with the ring finger ray.  He has no digital contractures of his right hand either.  The nodules are not bothersome or painful he just wanted to have them assessed.  He has no family history of Dupuytren's disease that he is aware of.  He has no disease in other locations.    Review of Systems   Objective: Vital Signs: BP 137/87 (BP Location: Left Arm,  Patient Position: Sitting)   Pulse 72   Ht 5\' 11"  (1.803 m)   Wt 232 lb (105.2 kg)   BMI 32.36 kg/m   Physical Exam Constitutional:      Appearance: Normal appearance.  Cardiovascular:     Rate and Rhythm: Normal rate.     Pulses: Normal pulses.  Pulmonary:     Effort: Pulmonary effort is normal.  Skin:    General: Skin is warm and dry.     Capillary Refill: Capillary refill takes less than 2 seconds.  Neurological:     Mental Status: He is alert.    Right Hand Exam   Tenderness  The patient is experiencing no tenderness.   Range of Motion  The patient has normal right wrist ROM.   Muscle Strength  The patient has normal right wrist strength.  Other  Erythema: absent Sensation: normal Pulse: present  Comments:  Palpable nodules of palm in line with ring finger.  No cords.  No contracture of ring finger MP or IP joints. SILT throughout hand.    Left Hand Exam   Tenderness  The patient is experiencing no tenderness.   Range of Motion  The patient has normal left wrist ROM.  Muscle Strength  The patient has normal left wrist strength.  Other  Erythema: absent Sensation: normal Pulse: present  Comments:  Palpable nodules in palm in line with small finger with small pit around distal palmar crease.  No contracture of small finger at MP or PIP joint.  SILT throughout.      Specialty Comments:  No specialty comments available.  Imaging: No results found.   PMFS History: Patient Active Problem List   Diagnosis Date Noted   Dupuytren's contracture of hand 06/16/2021   Primary osteoarthritis of both hips 12/11/2020   Hyperlipidemia LDL goal <70 06/15/2020   Essential hypertension 06/05/2019   Type II diabetes mellitus with manifestations ([place]) 08/26/2016   OSA (obstructive sleep apnea) 08/04/2016   Routine general medical examination at a health care facility 10/21/2012   GERD 10/25/2009   Spinal stenosis, lumbar region, without neurogenic  claudication 02/16/2008   Past Medical History:  Diagnosis Date   Chest pain, unspecified    Diabetes mellitus without complication ([place])    from neuroendocrine cancer treatment   Esophageal reflux    Hepatic steatosis    Lumbago    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   Nonspecific abnormal electrocardiogram (ECG) (EKG)    Nonspecific abnormal results of liver function study    Other abnormal glucose    Sleep apnea    hx .Marland Kitchen..doesnt wear mask    Family History  Problem Relation Age of Onset   Colon cancer Mother    Colon polyps Mother    Colon cancer Father    Colon polyps Father    Diabetes Other    Hypertension Other    Cancer Other        Breast Cancer   Colon cancer Paternal Uncle    Bone cancer Brother    Early death Neg Hx    Heart disease Neg Hx    Hyperlipidemia Neg Hx    Kidney disease Neg Hx    Stroke Neg Hx    Esophageal cancer Neg Hx    Rectal cancer Neg Hx    Stomach cancer Neg Hx     Past Surgical History:  Procedure Laterality Date   CHOLECYSTECTOMY  12/08/2018   COLONOSCOPY  2019   EXCISION HAGLUND'S DEFORMITY WITH ACHILLES TENDON REPAIR Right 02/03/2018   Procedure: Right Haglund excision, Achilles tendon reconstruction;  Surgeon: Wylene Simmer, MD;  Location: [facility];  Service: Orthopedics;  Laterality: Right;   GASTROC RECESSION EXTREMITY Right 02/03/2018   Procedure: GASTROC RECESSION EXTREMITY;  Surgeon: Wylene Simmer, MD;  Location: [facility];  Service: Orthopedics;  Laterality: Right;   PALATE / UVULA BIOPSY / EXCISION  2013   SMALL INTESTINE SURGERY  12/08/2018   neuroendocrine tumor   VASECTOMY  1986   VASECTOMY REVERSAL     Social History   Occupational History   Not on file  Tobacco Use   Smoking status: Never   Smokeless tobacco: Never  Vaping Use   Vaping Use: Never used  Substance and Sexual Activity   Alcohol use: Yes    Comment: rare   Drug use: No   Sexual activity: Yes    Partners: Female

## 2021-07-09 DIAGNOSIS — C7A011 Malignant carcinoid tumor of the jejunum: Secondary | ICD-10-CM | POA: Diagnosis not present

## 2021-07-09 DIAGNOSIS — Z800 Family history of malignant neoplasm of digestive organs: Secondary | ICD-10-CM | POA: Diagnosis not present

## 2021-07-09 DIAGNOSIS — Z803 Family history of malignant neoplasm of breast: Secondary | ICD-10-CM | POA: Diagnosis not present

## 2021-07-09 DIAGNOSIS — M25551 Pain in right hip: Secondary | ICD-10-CM | POA: Diagnosis not present

## 2021-07-09 DIAGNOSIS — M25552 Pain in left hip: Secondary | ICD-10-CM | POA: Diagnosis not present

## 2021-07-09 DIAGNOSIS — Z86010 Personal history of colonic polyps: Secondary | ICD-10-CM | POA: Diagnosis not present

## 2021-07-09 DIAGNOSIS — C7A019 Malignant carcinoid tumor of the small intestine, unspecified portion: Secondary | ICD-10-CM | POA: Diagnosis not present

## 2021-07-09 DIAGNOSIS — Z9049 Acquired absence of other specified parts of digestive tract: Secondary | ICD-10-CM | POA: Diagnosis not present

## 2021-07-09 DIAGNOSIS — C787 Secondary malignant neoplasm of liver and intrahepatic bile duct: Secondary | ICD-10-CM | POA: Diagnosis not present

## 2021-07-09 DIAGNOSIS — Z5111 Encounter for antineoplastic chemotherapy: Secondary | ICD-10-CM | POA: Diagnosis not present

## 2021-07-09 DIAGNOSIS — Z802 Family history of malignant neoplasm of other respiratory and intrathoracic organs: Secondary | ICD-10-CM | POA: Diagnosis not present

## 2021-07-09 DIAGNOSIS — Z79899 Other long term (current) drug therapy: Secondary | ICD-10-CM | POA: Diagnosis not present

## 2021-07-16 ENCOUNTER — Other Ambulatory Visit: Payer: Self-pay | Admitting: Internal Medicine

## 2021-07-16 DIAGNOSIS — E118 Type 2 diabetes mellitus with unspecified complications: Secondary | ICD-10-CM

## 2021-07-16 MED ORDER — RYBELSUS 7 MG PO TABS: 7.0000 mg | ORAL_TABLET | Freq: Every day | ORAL | 0 refills | Status: DC

## 2021-07-30 ENCOUNTER — Telehealth: Payer: Self-pay

## 2021-07-30 NOTE — Telephone Encounter (Signed)
Key: [id number]  Effective from 07/30/2021 through 07/29/2022.

## 2021-08-06 DIAGNOSIS — C7A011 Malignant carcinoid tumor of the jejunum: Secondary | ICD-10-CM | POA: Diagnosis not present

## 2021-09-02 DIAGNOSIS — E119 Type 2 diabetes mellitus without complications: Secondary | ICD-10-CM | POA: Diagnosis not present

## 2021-09-02 DIAGNOSIS — R059 Cough, unspecified: Secondary | ICD-10-CM | POA: Diagnosis not present

## 2021-09-02 DIAGNOSIS — H6691 Otitis media, unspecified, right ear: Secondary | ICD-10-CM | POA: Diagnosis not present

## 2021-09-02 DIAGNOSIS — J019 Acute sinusitis, unspecified: Secondary | ICD-10-CM | POA: Diagnosis not present

## 2021-09-03 DIAGNOSIS — C7A011 Malignant carcinoid tumor of the jejunum: Secondary | ICD-10-CM | POA: Diagnosis not present

## 2021-09-30 DIAGNOSIS — R918 Other nonspecific abnormal finding of lung field: Secondary | ICD-10-CM | POA: Diagnosis not present

## 2021-09-30 DIAGNOSIS — C787 Secondary malignant neoplasm of liver and intrahepatic bile duct: Secondary | ICD-10-CM | POA: Diagnosis not present

## 2021-09-30 DIAGNOSIS — C7A00 Malignant carcinoid tumor of unspecified site: Secondary | ICD-10-CM | POA: Diagnosis not present

## 2021-10-01 DIAGNOSIS — K56600 Partial intestinal obstruction, unspecified as to cause: Secondary | ICD-10-CM | POA: Diagnosis not present

## 2021-10-01 DIAGNOSIS — Z801 Family history of malignant neoplasm of trachea, bronchus and lung: Secondary | ICD-10-CM | POA: Diagnosis not present

## 2021-10-01 DIAGNOSIS — Z803 Family history of malignant neoplasm of breast: Secondary | ICD-10-CM | POA: Diagnosis not present

## 2021-10-01 DIAGNOSIS — Z800 Family history of malignant neoplasm of digestive organs: Secondary | ICD-10-CM | POA: Diagnosis not present

## 2021-10-01 DIAGNOSIS — C7A019 Malignant carcinoid tumor of the small intestine, unspecified portion: Secondary | ICD-10-CM | POA: Diagnosis not present

## 2021-10-01 DIAGNOSIS — R739 Hyperglycemia, unspecified: Secondary | ICD-10-CM | POA: Diagnosis not present

## 2021-10-01 DIAGNOSIS — M25552 Pain in left hip: Secondary | ICD-10-CM | POA: Diagnosis not present

## 2021-10-01 DIAGNOSIS — Z9049 Acquired absence of other specified parts of digestive tract: Secondary | ICD-10-CM | POA: Diagnosis not present

## 2021-10-01 DIAGNOSIS — C7A011 Malignant carcinoid tumor of the jejunum: Secondary | ICD-10-CM | POA: Diagnosis not present

## 2021-10-01 DIAGNOSIS — M25551 Pain in right hip: Secondary | ICD-10-CM | POA: Diagnosis not present

## 2021-10-01 DIAGNOSIS — C7B02 Secondary carcinoid tumors of liver: Secondary | ICD-10-CM | POA: Diagnosis not present

## 2021-10-07 ENCOUNTER — Other Ambulatory Visit: Payer: Self-pay | Admitting: Internal Medicine

## 2021-10-07 DIAGNOSIS — E118 Type 2 diabetes mellitus with unspecified complications: Secondary | ICD-10-CM

## 2021-10-26 ENCOUNTER — Encounter: Payer: Self-pay | Admitting: Internal Medicine

## 2021-10-29 DIAGNOSIS — C7A011 Malignant carcinoid tumor of the jejunum: Secondary | ICD-10-CM | POA: Diagnosis not present

## 2021-11-01 ENCOUNTER — Other Ambulatory Visit: Payer: Self-pay | Admitting: Internal Medicine

## 2021-11-01 DIAGNOSIS — E118 Type 2 diabetes mellitus with unspecified complications: Secondary | ICD-10-CM

## 2021-11-01 MED ORDER — DEXCOM G6 RECEIVER DEVI
1.0000 | Freq: Every day | 5 refills | Status: DC
Start: 2021-11-01 — End: 2021-11-17

## 2021-11-01 MED ORDER — DEXCOM G6 TRANSMITTER MISC
1.0000 | Freq: Every day | 5 refills | Status: DC
Start: 2021-11-01 — End: 2021-11-17

## 2021-11-01 MED ORDER — DEXCOM G6 SENSOR MISC: 1.0000 | Freq: Every day | 5 refills | Status: DC

## 2021-11-07 DIAGNOSIS — Z6832 Body mass index (BMI) 32.0-32.9, adult: Secondary | ICD-10-CM | POA: Diagnosis not present

## 2021-11-07 DIAGNOSIS — M5441 Lumbago with sciatica, right side: Secondary | ICD-10-CM | POA: Diagnosis not present

## 2021-11-07 DIAGNOSIS — R030 Elevated blood-pressure reading, without diagnosis of hypertension: Secondary | ICD-10-CM | POA: Diagnosis not present

## 2021-11-17 ENCOUNTER — Other Ambulatory Visit: Payer: Self-pay | Admitting: Internal Medicine

## 2021-11-17 DIAGNOSIS — E118 Type 2 diabetes mellitus with unspecified complications: Secondary | ICD-10-CM

## 2021-11-17 MED ORDER — DEXCOM G7 SENSOR MISC: 1.0000 | Freq: Every day | 5 refills | Status: DC

## 2021-11-17 MED ORDER — DEXCOM G7 RECEIVER DEVI: 1.0000 | Freq: Every day | 5 refills | Status: DC

## 2021-11-17 MED ORDER — DEXCOM G6 RECEIVER DEVI: 1.0000 | Freq: Every day | 5 refills | Status: DC

## 2021-11-26 DIAGNOSIS — C7A011 Malignant carcinoid tumor of the jejunum: Secondary | ICD-10-CM | POA: Diagnosis not present

## 2021-11-28 ENCOUNTER — Other Ambulatory Visit: Payer: Self-pay | Admitting: Internal Medicine

## 2021-11-28 DIAGNOSIS — M160 Bilateral primary osteoarthritis of hip: Secondary | ICD-10-CM

## 2021-12-03 LAB — HM DIABETES EYE EXAM

## 2021-12-16 ENCOUNTER — Ambulatory Visit: Payer: BC Managed Care – PPO | Admitting: Internal Medicine

## 2021-12-16 ENCOUNTER — Encounter: Payer: Self-pay | Admitting: Internal Medicine

## 2021-12-16 VITALS — BP 126/82 | HR 73 | Temp 97.9°F | Resp 16 | Ht 71.0 in | Wt 229.0 lb

## 2021-12-16 DIAGNOSIS — I10 Essential (primary) hypertension: Secondary | ICD-10-CM

## 2021-12-16 DIAGNOSIS — E118 Type 2 diabetes mellitus with unspecified complications: Secondary | ICD-10-CM

## 2021-12-16 LAB — URINALYSIS, ROUTINE W REFLEX MICROSCOPIC
Bilirubin Urine: NEGATIVE
Hgb urine dipstick: NEGATIVE
Ketones, ur: NEGATIVE
Leukocytes,Ua: NEGATIVE
Nitrite: NEGATIVE
RBC / HPF: NONE SEEN (ref 0–?)
Specific Gravity, Urine: >=1.03 — AB (ref 1.000–1.030)
Total Protein, Urine: NEGATIVE
Urine Glucose: 250 — AB
Urobilinogen, UA: 1 (ref 0.0–1.0)
pH: 5.5 (ref 5.0–8.0)

## 2021-12-16 LAB — BASIC METABOLIC PANEL
BUN: 13 mg/dL (ref 6–23)
CO2: 25 mEq/L (ref 19–32)
Calcium: 9.6 mg/dL (ref 8.4–10.5)
Chloride: 105 mEq/L (ref 96–112)
Creatinine, Ser: 0.81 mg/dL (ref 0.40–1.50)
GFR: 96.64 mL/min (ref >60.00)
Glucose, Bld: 99 mg/dL (ref 70–99)
Potassium: 3.9 mEq/L (ref 3.5–5.1)
Sodium: 139 mEq/L (ref 135–145)

## 2021-12-16 LAB — HEMOGLOBIN A1C: Hgb A1c MFr Bld: 6.7 % — ABNORMAL HIGH (ref 4.6–6.5)

## 2021-12-16 LAB — MICROALBUMIN / CREATININE URINE RATIO
Creatinine,U: 182.4 mg/dL
Microalb Creat Ratio: 0.6 mg/g (ref 0.0–30.0)
Microalb, Ur: 1.1 mg/dL (ref 0.0–1.9)

## 2021-12-16 MED ORDER — DEXCOM G7 SENSOR MISC: 1.0000 | Freq: Every day | 5 refills | Status: DC

## 2021-12-16 NOTE — Patient Instructions (Signed)
Type 2 Diabetes Mellitus, Diagnosis, Adult ?Type 2 diabetes (type 2 diabetes mellitus) is a long-term, or chronic, disease. In type 2 diabetes, one or both of these problems may be present: ?The pancreas does not make enough of a hormone called insulin. ?Cells in the body do not respond properly to the insulin that the body makes (insulin resistance). ?Normally, insulin allows blood sugar (glucose) to enter cells in the body. The cells use glucose for energy. Insulin resistance or lack of insulin causes excess glucose to build up in the blood instead of going into cells. This causes high blood glucose (hyperglycemia).  ?What are the causes? ?The exact cause of type 2 diabetes is not known. ?What increases the risk? ?The following factors may make you more likely to develop this condition: ?Having a family member with type 2 diabetes. ?Being overweight or obese. ?Being inactive (sedentary). ?Having been diagnosed with insulin resistance. ?Having a history of prediabetes, diabetes when you were pregnant (gestational diabetes), or polycystic ovary syndrome (PCOS). ?What are the signs or symptoms? ?In the early stage of this condition, you may not have symptoms. Symptoms develop slowly and may include: ?Increased thirst or hunger. ?Increased urination. ?Unexplained weight loss. ?Tiredness (fatigue) or weakness. ?Vision changes, such as blurry vision. ?Dark patches on the skin. ?How is this diagnosed? ?This condition is diagnosed based on your symptoms, your medical history, a physical exam, and your blood glucose level. Your blood glucose may be checked with one or more of the following blood tests: ?A fasting blood glucose (FBG) test. You will not be allowed to eat (you will fast) for 8 hours or longer before a blood sample is taken. ?A random blood glucose test. This test checks blood glucose at any time of day regardless of when you ate. ?An A1C (hemoglobin A1C) blood test. This test provides information about blood  glucose levels over the previous 2-3 months. ?An oral glucose tolerance test (OGTT). This test measures your blood glucose at two times: ?After fasting. This is your baseline blood glucose level. ?Two hours after drinking a beverage that contains glucose. ?You may be diagnosed with type 2 diabetes if: ?Your fasting blood glucose level is 126 mg/dL (7.0 mmol/L) or higher. ?Your random blood glucose level is 200 mg/dL (11.1 mmol/L) or higher. ?Your A1C level is 6.5% or higher. ?Your oral glucose tolerance test result is higher than 200 mg/dL (11.1 mmol/L). ?These blood tests may be repeated to confirm your diagnosis. ?How is this treated? ?Your treatment may be managed by a specialist called an endocrinologist. Type 2 diabetes may be treated by following instructions from your health care provider about: ?Making dietary and lifestyle changes. These may include: ?Following a personalized nutrition plan that is developed by a registered dietitian. ?Exercising regularly. ?Finding ways to manage stress. ?Checking your blood glucose level as often as told. ?Taking diabetes medicines or insulin daily. This helps to keep your blood glucose levels in the healthy range. ?Taking medicines to help prevent complications from diabetes. Medicines may include: ?Aspirin. ?Medicine to lower cholesterol. ?Medicine to control blood pressure. ?Your health care provider will set treatment goals for you. Your goals will be based on your age, other medical conditions you have, and how you respond to diabetes treatment. Generally, the goal of treatment is to maintain the following blood glucose levels: ?Before meals: 80-130 mg/dL (4.4-7.2 mmol/L). ?After meals: below 180 mg/dL (10 mmol/L). ?A1C level: less than 7%. ?Follow these instructions at home: ?Questions to ask your health care provider ?  Consider asking the following questions: ?Should I meet with a certified diabetes care and education specialist? ?What diabetes medicines do I need,  and when should I take them? ?What equipment will I need to manage my diabetes at home? ?How often do I need to check my blood glucose? ?Where can I find a support group for people with diabetes? ?What number can I call if I have questions? ?When is my next appointment? ?General instructions ?Take over-the-counter and prescription medicines only as told by your health care provider. ?Keep all follow-up visits. This is important. ?Where to find more information ?For help and guidance and for more information about diabetes, please visit: ?American Diabetes Association (ADA): www.diabetes.org ?American Association of Diabetes Care and Education Specialists (ADCES): www.diabeteseducator.org ?International Diabetes Federation (IDF): www.idf.org ?Contact a health care provider if: ?Your blood glucose is at or above 240 mg/dL (13.3 mmol/L) for 2 days in a row. ?You have been sick or have had a fever for 2 days or longer, and you are not getting better. ?You have any of the following problems for more than 6 hours: ?You cannot eat or drink. ?You have nausea and vomiting. ?You have diarrhea. ?Get help right away if: ?You have severe hypoglycemia. This means your blood glucose is lower than 54 mg/dL (3.0 mmol/L). ?You become confused or you have trouble thinking clearly. ?You have difficulty breathing. ?You have moderate or large ketone levels in your urine. ?These symptoms may represent a serious problem that is an emergency. Do not wait to see if the symptoms will go away. Get medical help right away. Call your local emergency services (911 in the [place] Do not drive yourself to the hospital. ?Summary ?Type 2 diabetes mellitus is a long-term, or chronic, disease. In type 2 diabetes, the pancreas does not make enough of a hormone called insulin, or cells in the body do not respond properly to insulin that the body makes. ?This condition is treated by making dietary and lifestyle changes and taking diabetes medicines or  insulin. ?Your health care provider will set treatment goals for you. Your goals will be based on your age, other medical conditions you have, and how you respond to diabetes treatment. ?Keep all follow-up visits. This is important. ?This information is not intended to replace advice given to you by your health care provider. Make sure you discuss any questions you have with your health care provider. ?Document Revised: 11/25/2020 Document Reviewed: 11/25/2020 ?Elsevier Patient Education ? 2022 Elsevier Inc. ? ?

## 2021-12-16 NOTE — Progress Notes (Signed)
? ?Subjective:  ?Patient ID: Henry Lang, male    DOB: [birth year]  Age: 61 y.o. MRN: [id number] ? ?CC: Hypertension and Diabetes ? ? ?HPI ?Henry Lang presents for f/up - ? ?He is very active and denies chest pain, shortness of breath, diaphoresis, dizziness, lightheadedness, or edema. ? ?Outpatient Medications Prior to Visit  ?Medication Sig Dispense Refill  ? acyclovir cream (ZOVIRAX) 5 % Apply topically.    ? Continuous Blood Gluc Receiver (DEXCOM G7 RECEIVER) DEVI 1 Act by Does not apply route daily. 2 each 5  ? meloxicam (MOBIC) 15 MG tablet TAKE 1 TABLET(15 MG) BY MOUTH DAILY 90 tablet 0  ? rosuvastatin (CRESTOR) 5 MG tablet Take 1 tablet (5 mg total) by mouth daily. 90 tablet 1  ? RYBELSUS 7 MG TABS TAKE 1 TABLET BY MOUTH DAILY 90 tablet 0  ? Continuous Blood Gluc Sensor (DEXCOM G7 SENSOR) MISC 1 Act by Does not apply route daily. 2 each 5  ? ?No facility-administered medications prior to visit.  ? ? ?ROS ?Review of Systems  ?Constitutional: Negative.  Negative for diaphoresis and fatigue.  ?HENT: Negative.    ?Eyes: Negative.   ?Respiratory:  Negative for cough, chest tightness, shortness of breath and wheezing.   ?Cardiovascular:  Negative for chest pain, palpitations and leg swelling.  ?Gastrointestinal:  Negative for abdominal pain, constipation, diarrhea, nausea and vomiting.  ?Endocrine: Negative.   ?Genitourinary: Negative.  Negative for difficulty urinating.  ?Musculoskeletal: Negative.   ?Skin: Negative.  Negative for color change.  ?Neurological: Negative.  Negative for dizziness, weakness, light-headedness and headaches.  ?Hematological:  Negative for adenopathy. Does not bruise/bleed easily.  ?Psychiatric/Behavioral: Negative.    ? ?Objective:  ?BP 126/82 (BP Location: Right Arm, Patient Position: Sitting, Cuff Size: Large)   Pulse 73   Temp 97.9 ?F (36.6 ?C) (Oral)   Resp 16   Ht '5\' 11"'$  (1.803 m)   Wt 229 lb (103.9 kg)   SpO2 98%   BMI 31.94 kg/m?  ? ?BP Readings from Last 3 Encounters:   ?12/16/21 126/82  ?06/24/21 137/87  ?06/16/21 120/78  ? ? ?Wt Readings from Last 3 Encounters:  ?12/16/21 229 lb (103.9 kg)  ?06/24/21 232 lb (105.2 kg)  ?06/16/21 232 lb (105.2 kg)  ? ? ?Physical Exam ?Vitals reviewed.  ?HENT:  ?   Nose: Nose normal.  ?   Mouth/Throat:  ?   Mouth: Mucous membranes are moist.  ?Eyes:  ?   General: No scleral icterus. ?   Conjunctiva/sclera: Conjunctivae normal.  ?Cardiovascular:  ?   Rate and Rhythm: Normal rate and regular rhythm.  ?   Heart sounds: No murmur heard. ?Pulmonary:  ?   Effort: Pulmonary effort is normal.  ?   Breath sounds: No stridor. No wheezing, rhonchi or rales.  ?Abdominal:  ?   General: Abdomen is flat.  ?   Palpations: There is no mass.  ?   Tenderness: There is no abdominal tenderness. There is no guarding.  ?   Hernia: No hernia is present.  ?Musculoskeletal:     ?   General: Normal range of motion.  ?   Cervical back: Neck supple.  ?   Right lower leg: No edema.  ?   Left lower leg: No edema.  ?Lymphadenopathy:  ?   Cervical: No cervical adenopathy.  ?Skin: ?   General: Skin is warm and dry.  ?Neurological:  ?   General: No focal deficit present.  ?   Mental Status: He  is alert. Mental status is at baseline.  ?Psychiatric:     ?   Mood and Affect: Mood normal.     ?   Behavior: Behavior normal.  ? ? ?Lab Results  ?Component Value Date  ? WBC 6.1 06/16/2021  ? HGB 13.4 06/16/2021  ? HCT 39.7 06/16/2021  ? PLT 170.0 06/16/2021  ? GLUCOSE 99 12/16/2021  ? CHOL 76 06/16/2021  ? TRIG 73.0 06/16/2021  ? HDL 38.50 (L) 06/16/2021  ? [place] 23 06/16/2021  ? ALT 31 06/16/2021  ? AST 24 06/16/2021  ? NA 139 12/16/2021  ? K 3.9 12/16/2021  ? CL 105 12/16/2021  ? CREATININE 0.81 12/16/2021  ? BUN 13 12/16/2021  ? CO2 25 12/16/2021  ? TSH 3.92 06/16/2021  ? PSA 0.35 06/16/2021  ? INR 0.99 08/04/2018  ? HGBA1C 6.7 (H) 12/16/2021  ? MICROALBUR 1.1 12/16/2021  ? ? ?NM PET (NETSPOT GA 26 DOTATATE) SKULL BASE TO MID THIGH ? ?Result Date: 08/30/2018 ?CLINICAL DATA:  Well  differentiated neuroendocrine tumor. EXAM: NUCLEAR MEDICINE PET SKULL BASE TO THIGH TECHNIQUE: mCi Ga 35 DOTATATE was injected intravenously. Full-ring PET imaging was performed from the skull base to thigh after the radiotracer. CT data was obtained and used for attenuation correction and anatomic localization. COMPARISON:  None. FINDINGS: NECK No radiotracer activity in neck lymph nodes. Incidental CT findings: None CHEST No radiotracer accumulation within mediastinal or hilar lymph nodes. No suspicious pulmonary nodules on the CT scan. Incidental CT finding:4 mm RIGHT lower lobe nodule on image 88/4. No associated radiotracer activity. Posterior RIGHT lower lobe nodule measuring 4 mm on image 87/4 also without metabolic activity. Radiotracer activity ABDOMEN/PELVIS Intense radiotracer activity associated with a small mesenteric/small-bowel lesion in the RIGHT abdomen. Lesion is difficult to identify on the noncontrast CT but does localize to a 14 mm lesion adjacent the small bowel on image 147/4. The radiotracer activity is intense with SUV max equal 15.6. Small adjacent mesenteric nodule measuring 8 mm (image 42/4) also with radiotracer activity (SUV max equal 9.7). A third focus of radiotracer activity associated small bowel in the RIGHT abdomen just anterior to the ascending colon with SUV max equal 12.0. No clear CT lesion identified. Lesion found on image 152 of the fused data set. Hepatic metastasis seen on comparison MRI (07/05/2018) are not as clearly depicted on the DOTATATE PET scan. There are 2 lesions in the RIGHT hepatic lobe with radiotracer activity above background measuring approximately 1 cm each on image 101 of the fused data set. SUV max equal 14.8 and 13.8 respectively. Background liver activity is 12.3. Physiologic activity noted in the adrenal glands, spleen and kidneys. Incidental CT findings:None SKELETON No focal activity to suggest skeletal metastasis. Incidental CT findings:None  IMPRESSION: 1. Small (1.5) metastatic mesentery lesion versus primary small bowel lesion in the RIGHT abdomen with intense radiotracer activity. Findings consistent well differentiated neuroendocrine tumor. 2. Small nodular mesenteric implant adjacent to the above dominant lesion. 3. Small implant along the RIGHT abdomen associated with the small bowel without CT correlation. 4. At least 2 hepatic metastasis accumulate the somatostatin specific radiotracer radiotracer. The degree of radiotracer uptake within liver lesions is less than expected for well differentiated neuroendocrine tumor. This may be in part due to high background liver activity. Electronically Signed   By: Suzy Bouchard M.D.   On: 08/30/2018 15:58  ? ? ?Assessment & Plan:  ? ?Mikhael was seen today for hypertension and diabetes. ? ?Diagnoses and all orders for this  visit: ? ?Type II diabetes mellitus with manifestations ([place])- His blood sugar is adequately well controlled.  Will continue the current regimen. ?-     Basic metabolic panel; Future ?-     Hemoglobin A1c; Future ?-     Microalbumin / creatinine urine ratio; Future ?-     Urinalysis, Routine w reflex microscopic; Future ?-     Continuous Blood Gluc Sensor (DEXCOM G7 SENSOR) MISC; 1 Act by Does not apply route daily. ?-     Urinalysis, Routine w reflex microscopic ?-     Microalbumin / creatinine urine ratio ?-     Hemoglobin A1c ?-     Basic metabolic panel ? ?Essential hypertension- His blood pressure is adequately well controlled. ?-     Basic metabolic panel; Future ?-     Urinalysis, Routine w reflex microscopic; Future ?-     Urinalysis, Routine w reflex microscopic ?-     Basic metabolic panel ? ? ?I am having Henry Lang maintain his acyclovir cream, rosuvastatin, Rybelsus, Dexcom G7 Receiver, meloxicam, and Dexcom G7 Sensor. ? ?Meds ordered this encounter  ?Medications  ? Continuous Blood Gluc Sensor (DEXCOM G7 SENSOR) MISC  ?  Sig: 1 Act by Does not apply route daily.  ?   Dispense:  2 each  ?  Refill:  5  ? ? ? ?Follow-up: Return in about 6 months (around 06/17/2022). ? ?Scarlette Calico, MD ?

## 2021-12-17 ENCOUNTER — Telehealth: Payer: Self-pay

## 2021-12-17 NOTE — Telephone Encounter (Signed)
Sensor ?Key: [id number] ? ?Approved ?Effective from 12/17/2021 through 12/16/2022. ? ?

## 2021-12-24 DIAGNOSIS — R739 Hyperglycemia, unspecified: Secondary | ICD-10-CM | POA: Diagnosis not present

## 2021-12-24 DIAGNOSIS — C7A00 Malignant carcinoid tumor of unspecified site: Secondary | ICD-10-CM | POA: Diagnosis not present

## 2021-12-24 DIAGNOSIS — C7A011 Malignant carcinoid tumor of the jejunum: Secondary | ICD-10-CM | POA: Diagnosis not present

## 2021-12-24 DIAGNOSIS — M25552 Pain in left hip: Secondary | ICD-10-CM | POA: Diagnosis not present

## 2021-12-24 DIAGNOSIS — R040 Epistaxis: Secondary | ICD-10-CM | POA: Diagnosis not present

## 2021-12-24 DIAGNOSIS — M25551 Pain in right hip: Secondary | ICD-10-CM | POA: Diagnosis not present

## 2022-01-02 ENCOUNTER — Other Ambulatory Visit: Payer: Self-pay | Admitting: Internal Medicine

## 2022-01-02 DIAGNOSIS — E785 Hyperlipidemia, unspecified: Secondary | ICD-10-CM

## 2022-01-07 ENCOUNTER — Encounter: Payer: Self-pay | Admitting: Internal Medicine

## 2022-01-07 ENCOUNTER — Other Ambulatory Visit: Payer: Self-pay | Admitting: Internal Medicine

## 2022-01-07 DIAGNOSIS — E118 Type 2 diabetes mellitus with unspecified complications: Secondary | ICD-10-CM

## 2022-01-14 ENCOUNTER — Other Ambulatory Visit: Payer: Self-pay | Admitting: Internal Medicine

## 2022-01-14 DIAGNOSIS — E118 Type 2 diabetes mellitus with unspecified complications: Secondary | ICD-10-CM

## 2022-01-14 MED ORDER — DEXCOM G7 RECEIVER DEVI: 1.0000 | Freq: Every day | 5 refills | Status: DC

## 2022-01-21 DIAGNOSIS — C7A011 Malignant carcinoid tumor of the jejunum: Secondary | ICD-10-CM | POA: Diagnosis not present

## 2022-02-18 DIAGNOSIS — C7A011 Malignant carcinoid tumor of the jejunum: Secondary | ICD-10-CM | POA: Diagnosis not present

## 2022-02-24 DIAGNOSIS — M5441 Lumbago with sciatica, right side: Secondary | ICD-10-CM | POA: Diagnosis not present

## 2022-02-24 DIAGNOSIS — Z6832 Body mass index (BMI) 32.0-32.9, adult: Secondary | ICD-10-CM | POA: Diagnosis not present

## 2022-02-25 DIAGNOSIS — E119 Type 2 diabetes mellitus without complications: Secondary | ICD-10-CM | POA: Diagnosis not present

## 2022-03-18 DIAGNOSIS — C7B8 Other secondary neuroendocrine tumors: Secondary | ICD-10-CM | POA: Diagnosis not present

## 2022-03-18 DIAGNOSIS — C7A011 Malignant carcinoid tumor of the jejunum: Secondary | ICD-10-CM | POA: Diagnosis not present

## 2022-03-18 DIAGNOSIS — Z9049 Acquired absence of other specified parts of digestive tract: Secondary | ICD-10-CM | POA: Diagnosis not present

## 2022-03-18 DIAGNOSIS — R040 Epistaxis: Secondary | ICD-10-CM | POA: Diagnosis not present

## 2022-03-18 DIAGNOSIS — Z809 Family history of malignant neoplasm, unspecified: Secondary | ICD-10-CM | POA: Diagnosis not present

## 2022-03-18 DIAGNOSIS — Z800 Family history of malignant neoplasm of digestive organs: Secondary | ICD-10-CM | POA: Diagnosis not present

## 2022-03-18 DIAGNOSIS — M25551 Pain in right hip: Secondary | ICD-10-CM | POA: Diagnosis not present

## 2022-03-18 DIAGNOSIS — Z801 Family history of malignant neoplasm of trachea, bronchus and lung: Secondary | ICD-10-CM | POA: Diagnosis not present

## 2022-03-18 DIAGNOSIS — M25552 Pain in left hip: Secondary | ICD-10-CM | POA: Diagnosis not present

## 2022-03-18 DIAGNOSIS — Z803 Family history of malignant neoplasm of breast: Secondary | ICD-10-CM | POA: Diagnosis not present

## 2022-03-25 DIAGNOSIS — M5441 Lumbago with sciatica, right side: Secondary | ICD-10-CM | POA: Diagnosis not present

## 2022-03-25 DIAGNOSIS — M5116 Intervertebral disc disorders with radiculopathy, lumbar region: Secondary | ICD-10-CM | POA: Diagnosis not present

## 2022-03-31 DIAGNOSIS — M47816 Spondylosis without myelopathy or radiculopathy, lumbar region: Secondary | ICD-10-CM | POA: Diagnosis not present

## 2022-03-31 DIAGNOSIS — M5441 Lumbago with sciatica, right side: Secondary | ICD-10-CM | POA: Diagnosis not present

## 2022-03-31 DIAGNOSIS — Z6831 Body mass index (BMI) 31.0-31.9, adult: Secondary | ICD-10-CM | POA: Diagnosis not present

## 2022-04-06 DIAGNOSIS — Z803 Family history of malignant neoplasm of breast: Secondary | ICD-10-CM | POA: Diagnosis not present

## 2022-04-06 DIAGNOSIS — M47816 Spondylosis without myelopathy or radiculopathy, lumbar region: Secondary | ICD-10-CM | POA: Diagnosis not present

## 2022-04-06 DIAGNOSIS — C7A011 Malignant carcinoid tumor of the jejunum: Secondary | ICD-10-CM | POA: Diagnosis not present

## 2022-04-06 DIAGNOSIS — Z800 Family history of malignant neoplasm of digestive organs: Secondary | ICD-10-CM | POA: Diagnosis not present

## 2022-04-06 DIAGNOSIS — Z6830 Body mass index (BMI) 30.0-30.9, adult: Secondary | ICD-10-CM | POA: Diagnosis not present

## 2022-04-13 ENCOUNTER — Other Ambulatory Visit: Payer: Self-pay | Admitting: Internal Medicine

## 2022-04-13 DIAGNOSIS — E118 Type 2 diabetes mellitus with unspecified complications: Secondary | ICD-10-CM

## 2022-04-15 DIAGNOSIS — C7A011 Malignant carcinoid tumor of the jejunum: Secondary | ICD-10-CM | POA: Diagnosis not present

## 2022-04-29 ENCOUNTER — Encounter: Payer: Self-pay | Admitting: Internal Medicine

## 2022-04-29 DIAGNOSIS — E118 Type 2 diabetes mellitus with unspecified complications: Secondary | ICD-10-CM

## 2022-04-30 MED ORDER — DEXCOM G7 SENSOR MISC: 1.0000 | Freq: Every day | 5 refills | Status: DC

## 2022-05-13 ENCOUNTER — Other Ambulatory Visit: Payer: Self-pay | Admitting: Internal Medicine

## 2022-05-13 DIAGNOSIS — C7A011 Malignant carcinoid tumor of the jejunum: Secondary | ICD-10-CM | POA: Diagnosis not present

## 2022-05-13 DIAGNOSIS — E118 Type 2 diabetes mellitus with unspecified complications: Secondary | ICD-10-CM

## 2022-06-01 DIAGNOSIS — R918 Other nonspecific abnormal finding of lung field: Secondary | ICD-10-CM | POA: Diagnosis not present

## 2022-06-01 DIAGNOSIS — C7B02 Secondary carcinoid tumors of liver: Secondary | ICD-10-CM | POA: Diagnosis not present

## 2022-06-01 DIAGNOSIS — C7A011 Malignant carcinoid tumor of the jejunum: Secondary | ICD-10-CM | POA: Diagnosis not present

## 2022-06-03 DIAGNOSIS — Z801 Family history of malignant neoplasm of trachea, bronchus and lung: Secondary | ICD-10-CM | POA: Diagnosis not present

## 2022-06-03 DIAGNOSIS — M25552 Pain in left hip: Secondary | ICD-10-CM | POA: Diagnosis not present

## 2022-06-03 DIAGNOSIS — Z9049 Acquired absence of other specified parts of digestive tract: Secondary | ICD-10-CM | POA: Diagnosis not present

## 2022-06-03 DIAGNOSIS — K56600 Partial intestinal obstruction, unspecified as to cause: Secondary | ICD-10-CM | POA: Diagnosis not present

## 2022-06-03 DIAGNOSIS — M25551 Pain in right hip: Secondary | ICD-10-CM | POA: Diagnosis not present

## 2022-06-03 DIAGNOSIS — C7A8 Other malignant neuroendocrine tumors: Secondary | ICD-10-CM | POA: Diagnosis not present

## 2022-06-03 DIAGNOSIS — Z800 Family history of malignant neoplasm of digestive organs: Secondary | ICD-10-CM | POA: Diagnosis not present

## 2022-06-03 DIAGNOSIS — Z803 Family history of malignant neoplasm of breast: Secondary | ICD-10-CM | POA: Diagnosis not present

## 2022-06-03 DIAGNOSIS — C7A011 Malignant carcinoid tumor of the jejunum: Secondary | ICD-10-CM | POA: Diagnosis not present

## 2022-06-03 DIAGNOSIS — M48061 Spinal stenosis, lumbar region without neurogenic claudication: Secondary | ICD-10-CM | POA: Diagnosis not present

## 2022-06-10 DIAGNOSIS — C7A011 Malignant carcinoid tumor of the jejunum: Secondary | ICD-10-CM | POA: Diagnosis not present

## 2022-07-02 ENCOUNTER — Encounter: Payer: Self-pay | Admitting: Internal Medicine

## 2022-07-02 ENCOUNTER — Ambulatory Visit (INDEPENDENT_AMBULATORY_CARE_PROVIDER_SITE_OTHER): Payer: BC Managed Care – PPO | Admitting: Internal Medicine

## 2022-07-02 VITALS — BP 116/78 | HR 72 | Temp 97.9°F | Ht 71.0 in | Wt 223.0 lb

## 2022-07-02 DIAGNOSIS — R195 Other fecal abnormalities: Secondary | ICD-10-CM | POA: Insufficient documentation

## 2022-07-02 DIAGNOSIS — E785 Hyperlipidemia, unspecified: Secondary | ICD-10-CM

## 2022-07-02 DIAGNOSIS — M160 Bilateral primary osteoarthritis of hip: Secondary | ICD-10-CM

## 2022-07-02 DIAGNOSIS — I10 Essential (primary) hypertension: Secondary | ICD-10-CM

## 2022-07-02 DIAGNOSIS — E118 Type 2 diabetes mellitus with unspecified complications: Secondary | ICD-10-CM | POA: Diagnosis not present

## 2022-07-02 DIAGNOSIS — Z0000 Encounter for general adult medical examination without abnormal findings: Secondary | ICD-10-CM | POA: Diagnosis not present

## 2022-07-02 DIAGNOSIS — M48061 Spinal stenosis, lumbar region without neurogenic claudication: Secondary | ICD-10-CM

## 2022-07-02 LAB — CBC WITH DIFFERENTIAL/PLATELET
Basophils Absolute: 0 10*3/uL (ref 0.0–0.1)
Basophils Relative: 0.7 % (ref 0.0–3.0)
Eosinophils Absolute: 0.1 10*3/uL (ref 0.0–0.7)
Eosinophils Relative: 2 % (ref 0.0–5.0)
HCT: 40 % (ref 39.0–52.0)
Hemoglobin: 13.7 g/dL (ref 13.0–17.0)
Lymphocytes Relative: 30.3 % (ref 12.0–46.0)
Lymphs Abs: 1.4 10*3/uL (ref 0.7–4.0)
MCHC: 34.3 g/dL (ref 30.0–36.0)
MCV: 89 fl (ref 78.0–100.0)
Monocytes Absolute: 0.3 10*3/uL (ref 0.1–1.0)
Monocytes Relative: 7.1 % (ref 3.0–12.0)
Neutro Abs: 2.9 10*3/uL (ref 1.4–7.7)
Neutrophils Relative %: 59.9 % (ref 43.0–77.0)
Platelets: 181 10*3/uL (ref 150.0–400.0)
RBC: 4.49 Mil/uL (ref 4.22–5.81)
RDW: 13.7 % (ref 11.5–15.5)
WBC: 4.8 10*3/uL (ref 4.0–10.5)

## 2022-07-02 LAB — HEPATIC FUNCTION PANEL
ALT: 54 U/L — ABNORMAL HIGH (ref 0–53)
AST: 36 U/L (ref 0–37)
Albumin: 4.6 g/dL (ref 3.5–5.2)
Alkaline Phosphatase: 54 U/L (ref 39–117)
Bilirubin, Direct: 0.2 mg/dL (ref 0.0–0.3)
Total Bilirubin: 0.7 mg/dL (ref 0.2–1.2)
Total Protein: 6.9 g/dL (ref 6.0–8.3)

## 2022-07-02 LAB — LIPID PANEL
Cholesterol: 84 mg/dL (ref 0–200)
HDL: 35.2 mg/dL — ABNORMAL LOW (ref >39.00)
LDL Cholesterol: 34 mg/dL (ref 0–99)
NonHDL: 48.38
Total CHOL/HDL Ratio: 2
Triglycerides: 70 mg/dL (ref 0.0–149.0)
VLDL: 14 mg/dL (ref 0.0–40.0)

## 2022-07-02 LAB — TSH: TSH: 3.17 u[IU]/mL (ref 0.35–5.50)

## 2022-07-02 LAB — PSA: PSA: 0.34 ng/mL (ref 0.10–4.00)

## 2022-07-02 LAB — HEMOGLOBIN A1C: Hgb A1c MFr Bld: 6.1 % (ref 4.6–6.5)

## 2022-07-02 MED ORDER — RYBELSUS 7 MG PO TABS: 1.0000 | ORAL_TABLET | Freq: Every day | ORAL | 0 refills | Status: DC

## 2022-07-02 MED ORDER — ROSUVASTATIN CALCIUM 5 MG PO TABS: 5.0000 mg | ORAL_TABLET | Freq: Every day | ORAL | 1 refills | Status: DC

## 2022-07-02 MED ORDER — TRAMADOL HCL ER 100 MG PO TB24: 100.0000 mg | ORAL_TABLET | Freq: Every day | ORAL | 1 refills | Status: DC | PRN

## 2022-07-02 MED ORDER — TIRZEPATIDE 2.5 MG/0.5ML ~~LOC~~ SOAJ: 2.5000 mg | SUBCUTANEOUS | 0 refills | Status: DC

## 2022-07-02 NOTE — Patient Instructions (Signed)
Health Maintenance, Male Adopting a healthy lifestyle and getting preventive care are important in promoting health and wellness. Ask your health care provider about: The right schedule for you to have regular tests and exams. Things you can do on your own to prevent diseases and keep yourself healthy. What should I know about diet, weight, and exercise? Eat a healthy diet  Eat a diet that includes plenty of vegetables, fruits, low-fat dairy products, and lean protein. Do not eat a lot of foods that are high in solid fats, added sugars, or sodium. Maintain a healthy weight Body mass index (BMI) is a measurement that can be used to identify possible weight problems. It estimates body fat based on height and weight. Your health care provider can help determine your BMI and help you achieve or maintain a healthy weight. Get regular exercise Get regular exercise. This is one of the most important things you can do for your health. Most adults should: Exercise for at least 150 minutes each week. The exercise should increase your heart rate and make you sweat (moderate-intensity exercise). Do strengthening exercises at least twice a week. This is in addition to the moderate-intensity exercise. Spend less time sitting. Even light physical activity can be beneficial. Watch cholesterol and blood lipids Have your blood tested for lipids and cholesterol at 60 years of age, then have this test every 5 years. You may need to have your cholesterol levels checked more often if: Your lipid or cholesterol levels are high. You are older than 60 years of age. You are at high risk for heart disease. What should I know about cancer screening? Many types of cancers can be detected early and may often be prevented. Depending on your health history and family history, you may need to have cancer screening at various ages. This may include screening for: Colorectal cancer. Prostate cancer. Skin cancer. Lung  cancer. What should I know about heart disease, diabetes, and high blood pressure? Blood pressure and heart disease High blood pressure causes heart disease and increases the risk of stroke. This is more likely to develop in people who have high blood pressure readings or are overweight. Talk with your health care provider about your target blood pressure readings. Have your blood pressure checked: Every 3-5 years if you are 18-39 years of age. Every year if you are 40 years old or older. If you are between the ages of 65 and 75 and are a current or former smoker, ask your health care provider if you should have a one-time screening for abdominal aortic aneurysm (AAA). Diabetes Have regular diabetes screenings. This checks your fasting blood sugar level. Have the screening done: Once every three years after age 45 if you are at a normal weight and have a low risk for diabetes. More often and at a younger age if you are overweight or have a high risk for diabetes. What should I know about preventing infection? Hepatitis B If you have a higher risk for hepatitis B, you should be screened for this virus. Talk with your health care provider to find out if you are at risk for hepatitis B infection. Hepatitis C Blood testing is recommended for: Everyone born from 1945 through 1965. Anyone with known risk factors for hepatitis C. Sexually transmitted infections (STIs) You should be screened each year for STIs, including gonorrhea and chlamydia, if: You are sexually active and are younger than 60 years of age. You are older than 60 years of age and your   health care provider tells you that you are at risk for this type of infection. Your sexual activity has changed since you were last screened, and you are at increased risk for chlamydia or gonorrhea. Ask your health care provider if you are at risk. Ask your health care provider about whether you are at high risk for HIV. Your health care provider  may recommend a prescription medicine to help prevent HIV infection. If you choose to take medicine to prevent HIV, you should first get tested for HIV. You should then be tested every 3 months for as long as you are taking the medicine. Follow these instructions at home: Alcohol use Do not drink alcohol if your health care provider tells you not to drink. If you drink alcohol: Limit how much you have to 0-2 drinks a day. Know how much alcohol is in your drink. In the [place] one drink equals one 12 oz bottle of beer (355 mL), one 5 oz glass of wine (148 mL), or one 1 oz glass of hard liquor (44 mL). Lifestyle Do not use any products that contain nicotine or tobacco. These products include cigarettes, chewing tobacco, and vaping devices, such as e-cigarettes. If you need help quitting, ask your health care provider. Do not use street drugs. Do not share needles. Ask your health care provider for help if you need support or information about quitting drugs. General instructions Schedule regular health, dental, and eye exams. Stay current with your vaccines. Tell your health care provider if: You often feel depressed. You have ever been abused or do not feel safe at home. Summary Adopting a healthy lifestyle and getting preventive care are important in promoting health and wellness. Follow your health care provider's instructions about healthy diet, exercising, and getting tested or screened for diseases. Follow your health care provider's instructions on monitoring your cholesterol and blood pressure. This information is not intended to replace advice given to you by your health care provider. Make sure you discuss any questions you have with your health care provider. Document Revised: 01/20/2021 Document Reviewed: 01/20/2021 Elsevier Patient Education  2023 Elsevier Inc.  

## 2022-07-02 NOTE — Progress Notes (Signed)
Subjective:  Patient ID: Henry Lang, male    DOB: 1961/07/26  Age: 61 y.o. MRN: [id number]  CC: Annual Exam, Hyperlipidemia, Diabetes, Back Pain, and Osteoarthritis   HPI ED MANDICH presents for a CPX and f/up -   He is active and denies DOE, CP, edema , SOB. He wants to change rybelsus to mounjaro. He complains of chronic LBP and bilateral hip pain. A NS recently prescribed diclofenac.  Outpatient Medications Prior to Visit  Medication Sig Dispense Refill   acyclovir cream (ZOVIRAX) 5 % Apply topically.     Continuous Blood Gluc Receiver (DEXCOM G7 RECEIVER) DEVI 1 Act by Does not apply route daily. 2 each 5   Continuous Blood Gluc Sensor (DEXCOM G7 SENSOR) MISC APPLY SENSOR AS DIRECTED 2 each 5   diclofenac (VOLTAREN) 75 MG EC tablet Take 75 mg by mouth 2 (two) times daily.     meloxicam (MOBIC) 15 MG tablet TAKE 1 TABLET(15 MG) BY MOUTH DAILY 90 tablet 0   rosuvastatin (CRESTOR) 5 MG tablet TAKE 1 TABLET(5 MG) BY MOUTH DAILY 90 tablet 1   RYBELSUS 7 MG TABS TAKE 1 TABLET BY MOUTH DAILY 90 tablet 0   No facility-administered medications prior to visit.    ROS Review of Systems  Constitutional: Negative.  Negative for chills, diaphoresis, fatigue and fever.  HENT: Negative.    Eyes: Negative.   Respiratory:  Negative for cough, chest tightness, shortness of breath and wheezing.   Cardiovascular:  Negative for chest pain, palpitations and leg swelling.  Gastrointestinal:  Negative for abdominal pain, blood in stool, constipation, diarrhea and vomiting.  Endocrine: Negative.   Genitourinary: Negative.  Negative for difficulty urinating, dysuria, scrotal swelling and testicular pain.  Musculoskeletal:  Positive for arthralgias and back pain.  Skin: Negative.   Allergic/Immunologic: Negative.   Neurological: Negative.   Hematological:  Negative for adenopathy. Does not bruise/bleed easily.  Psychiatric/Behavioral: Negative.      Objective:  BP 116/78 (BP Location: Left  Arm, Patient Position: Sitting, Cuff Size: Large)   Pulse 72   Temp 97.9 F (36.6 C) (Oral)   Ht '5\' 11"'$  (1.803 m)   Wt 223 lb (101.2 kg)   SpO2 94%   BMI 31.10 kg/m   BP Readings from Last 3 Encounters:  07/02/22 116/78  12/16/21 126/82  06/24/21 137/87    Wt Readings from Last 3 Encounters:  07/02/22 223 lb (101.2 kg)  12/16/21 229 lb (103.9 kg)  06/24/21 232 lb (105.2 kg)    Physical Exam Vitals reviewed.  HENT:     Nose: Nose normal.     Mouth/Throat:     Mouth: Mucous membranes are moist.  Eyes:     General: No scleral icterus.    Conjunctiva/sclera: Conjunctivae normal.  Cardiovascular:     Rate and Rhythm: Normal rate and regular rhythm.     Pulses: Normal pulses.     Heart sounds: No murmur heard.    Comments: EKG- NSR, 66 bpm No LVH or Q waves Normal EKG Pulmonary:     Effort: Pulmonary effort is normal.     Breath sounds: No stridor. No wheezing, rhonchi or rales.  Abdominal:     General: Abdomen is flat.     Palpations: There is no mass.     Tenderness: There is no abdominal tenderness. There is no guarding or rebound.     Hernia: No hernia is present. There is no hernia in the left inguinal area or right inguinal area.  Genitourinary:    Pubic Area: No rash.      Penis: Normal and circumcised.      Testes: Normal.     Epididymis:     Right: Normal.     Left: Normal.     Prostate: Not enlarged, not tender and no nodules present.     Rectum: Guaiac result positive. No mass, tenderness, anal fissure, external hemorrhoid or internal hemorrhoid. Normal anal tone.     Comments: Trace heme positive Musculoskeletal:        General: No swelling.     Cervical back: Neck supple.     Right lower leg: No edema.     Left lower leg: No edema.  Lymphadenopathy:     Cervical: No cervical adenopathy.     Lower Body: No right inguinal adenopathy. No left inguinal adenopathy.  Skin:    General: Skin is warm and dry.     Findings: No rash.  Neurological:      General: No focal deficit present.     Mental Status: He is alert.  Psychiatric:        Mood and Affect: Mood normal.        Behavior: Behavior normal.     Lab Results  Component Value Date   WBC 6.1 06/16/2021   HGB 13.4 06/16/2021   HCT 39.7 06/16/2021   PLT 170.0 06/16/2021   GLUCOSE 99 12/16/2021   CHOL 76 06/16/2021   TRIG 73.0 06/16/2021   HDL 38.50 (L) 06/16/2021   LDLCALC 23 06/16/2021   ALT 31 06/16/2021   AST 24 06/16/2021   NA 139 12/16/2021   K 3.9 12/16/2021   CL 105 12/16/2021   CREATININE 0.81 12/16/2021   BUN 13 12/16/2021   CO2 25 12/16/2021   TSH 3.92 06/16/2021   PSA 0.35 06/16/2021   INR 0.99 08/04/2018   HGBA1C 6.7 (H) 12/16/2021   MICROALBUR 1.1 12/16/2021    NM PET (NETSPOT GA 68 DOTATATE) SKULL BASE TO MID THIGH  Result Date: 08/30/2018 CLINICAL DATA:  Well differentiated neuroendocrine tumor. EXAM: NUCLEAR MEDICINE PET SKULL BASE TO THIGH TECHNIQUE: mCi Ga 57 DOTATATE was injected intravenously. Full-ring PET imaging was performed from the skull base to thigh after the radiotracer. CT data was obtained and used for attenuation correction and anatomic localization. COMPARISON:  None. FINDINGS: NECK No radiotracer activity in neck lymph nodes. Incidental CT findings: None CHEST No radiotracer accumulation within mediastinal or hilar lymph nodes. No suspicious pulmonary nodules on the CT scan. Incidental CT finding:4 mm RIGHT lower lobe nodule on image 88/4. No associated radiotracer activity. Posterior RIGHT lower lobe nodule measuring 4 mm on image 87/4 also without metabolic activity. Radiotracer activity ABDOMEN/PELVIS Intense radiotracer activity associated with a small mesenteric/small-bowel lesion in the RIGHT abdomen. Lesion is difficult to identify on the noncontrast CT but does localize to a 14 mm lesion adjacent the small bowel on image 147/4. The radiotracer activity is intense with SUV max equal 15.6. Small adjacent mesenteric nodule measuring 8  mm (image 42/4) also with radiotracer activity (SUV max equal 9.7). A third focus of radiotracer activity associated small bowel in the RIGHT abdomen just anterior to the ascending colon with SUV max equal 12.0. No clear CT lesion identified. Lesion found on image 152 of the fused data set. Hepatic metastasis seen on comparison MRI (07/05/2018) are not as clearly depicted on the DOTATATE PET scan. There are 2 lesions in the RIGHT hepatic lobe with radiotracer activity above background measuring approximately  1 cm each on image 101 of the fused data set. SUV max equal 14.8 and 13.8 respectively. Background liver activity is 12.3. Physiologic activity noted in the adrenal glands, spleen and kidneys. Incidental CT findings:None SKELETON No focal activity to suggest skeletal metastasis. Incidental CT findings:None IMPRESSION: 1. Small (1.5) metastatic mesentery lesion versus primary small bowel lesion in the RIGHT abdomen with intense radiotracer activity. Findings consistent well differentiated neuroendocrine tumor. 2. Small nodular mesenteric implant adjacent to the above dominant lesion. 3. Small implant along the RIGHT abdomen associated with the small bowel without CT correlation. 4. At least 2 hepatic metastasis accumulate the somatostatin specific radiotracer radiotracer. The degree of radiotracer uptake within liver lesions is less than expected for well differentiated neuroendocrine tumor. This may be in part due to high background liver activity. Electronically Signed   By: Suzy Bouchard M.D.   On: 08/30/2018 15:58    Assessment & Plan:   Mance was seen today for annual exam, hyperlipidemia, diabetes, back pain and osteoarthritis.  Diagnoses and all orders for this visit:  Essential hypertension- His BP is well controlled. EKG is neg for LVH. -     CBC with Differential/Platelet; Future -     EKG 12-Lead -     CBC with Differential/Platelet  Hyperlipidemia LDL goal <70 - LDL goal achieved.  Doing well on the statin  -     rosuvastatin (CRESTOR) 5 MG tablet; Take 1 tablet (5 mg total) by mouth daily. -     Lipid panel; Future -     TSH; Future -     Hepatic function panel; Future -     Hepatic function panel -     TSH -     Lipid panel  Type II diabetes mellitus with manifestations ([place]) - His BS is well controlled. -     Discontinue: Semaglutide (RYBELSUS) 7 MG TABS; Take 1 tablet by mouth daily. -     Hemoglobin A1c; Future -     HM Diabetes Foot Exam -     tirzepatide (MOUNJARO) 2.5 MG/0.5ML Pen; Inject 2.5 mg into the skin once a week. -     Hemoglobin A1c  Routine general medical examination at a health care facility- Exam completed, labs reviewed, vaccines reviewed - He refused a flu vaccine, cancer screenings addressed, patient education was given. -     PSA; Future -     PSA  Primary osteoarthritis of both hips -     traMADol (ULTRAM-ER) 100 MG 24 hr tablet; Take 1 tablet (100 mg total) by mouth daily as needed for pain.  Spinal stenosis, lumbar region, without neurogenic claudication -     traMADol (ULTRAM-ER) 100 MG 24 hr tablet; Take 1 tablet (100 mg total) by mouth daily as needed for pain.  Occult blood positive stool -     Ambulatory referral to Gastroenterology   I have discontinued Keiron V. Bloom's meloxicam, Rybelsus, and Rybelsus. I have also changed his rosuvastatin. Additionally, I am having him start on traMADol and tirzepatide. Lastly, I am having him maintain his acyclovir cream, Dexcom G7 Receiver, Dexcom G7 Sensor, and diclofenac.  Meds ordered this encounter  Medications   rosuvastatin (CRESTOR) 5 MG tablet    Sig: Take 1 tablet (5 mg total) by mouth daily.    Dispense:  90 tablet    Refill:  1   DISCONTD: Semaglutide (RYBELSUS) 7 MG TABS    Sig: Take 1 tablet by mouth daily.    Dispense:  90 tablet    Refill:  0   traMADol (ULTRAM-ER) 100 MG 24 hr tablet    Sig: Take 1 tablet (100 mg total) by mouth daily as needed for pain.     Dispense:  90 tablet    Refill:  1   tirzepatide (MOUNJARO) 2.5 MG/0.5ML Pen    Sig: Inject 2.5 mg into the skin once a week.    Dispense:  2 mL    Refill:  0     Follow-up: Return in about 6 months (around 01/01/2023).  Scarlette Calico, MD

## 2022-07-06 ENCOUNTER — Encounter: Payer: Self-pay | Admitting: Internal Medicine

## 2022-07-07 ENCOUNTER — Encounter: Payer: Self-pay | Admitting: Internal Medicine

## 2022-07-08 ENCOUNTER — Telehealth: Payer: Self-pay

## 2022-07-08 DIAGNOSIS — C7A011 Malignant carcinoid tumor of the jejunum: Secondary | ICD-10-CM | POA: Diagnosis not present

## 2022-07-08 NOTE — Telephone Encounter (Signed)
Key: [id number]

## 2022-07-08 NOTE — Telephone Encounter (Signed)
Approved Effective from 07/08/2022 through 07/07/2023.

## 2022-07-08 NOTE — Telephone Encounter (Signed)
Mounjaro Key: [id number]

## 2022-07-09 NOTE — Telephone Encounter (Signed)
Per CoverMyMeds PA was denied  No reason given.

## 2022-07-15 ENCOUNTER — Other Ambulatory Visit: Payer: Self-pay | Admitting: Internal Medicine

## 2022-07-15 ENCOUNTER — Encounter: Payer: Self-pay | Admitting: Internal Medicine

## 2022-07-15 DIAGNOSIS — M48061 Spinal stenosis, lumbar region without neurogenic claudication: Secondary | ICD-10-CM

## 2022-07-15 DIAGNOSIS — M160 Bilateral primary osteoarthritis of hip: Secondary | ICD-10-CM

## 2022-07-15 MED ORDER — TAPENTADOL HCL ER 50 MG PO TB12: 50.0000 mg | ORAL_TABLET | Freq: Two times a day (BID) | ORAL | 0 refills | Status: DC

## 2022-07-17 ENCOUNTER — Encounter: Payer: Self-pay | Admitting: Internal Medicine

## 2022-07-21 ENCOUNTER — Other Ambulatory Visit: Payer: Self-pay | Admitting: Internal Medicine

## 2022-07-21 DIAGNOSIS — M48061 Spinal stenosis, lumbar region without neurogenic claudication: Secondary | ICD-10-CM

## 2022-07-21 DIAGNOSIS — M160 Bilateral primary osteoarthritis of hip: Secondary | ICD-10-CM

## 2022-07-21 MED ORDER — OXYCODONE ER 9 MG PO C12A: 9.0000 mg | EXTENDED_RELEASE_CAPSULE | Freq: Two times a day (BID) | ORAL | 0 refills | Status: DC | PRN

## 2022-07-24 ENCOUNTER — Telehealth: Payer: Self-pay

## 2022-07-24 NOTE — Telephone Encounter (Signed)
Per CoverMyMeds:  PA was denied.   Please advise. 

## 2022-07-24 NOTE — Telephone Encounter (Signed)
Key: [id number]

## 2022-07-27 ENCOUNTER — Ambulatory Visit (AMBULATORY_SURGERY_CENTER): Payer: Self-pay

## 2022-07-27 ENCOUNTER — Other Ambulatory Visit: Payer: Self-pay | Admitting: Internal Medicine

## 2022-07-27 ENCOUNTER — Encounter: Payer: Self-pay | Admitting: Internal Medicine

## 2022-07-27 VITALS — Ht 71.0 in | Wt 224.0 lb

## 2022-07-27 DIAGNOSIS — E118 Type 2 diabetes mellitus with unspecified complications: Secondary | ICD-10-CM

## 2022-07-27 DIAGNOSIS — Z86010 Personal history of colonic polyps: Secondary | ICD-10-CM

## 2022-07-27 DIAGNOSIS — Z800 Family history of malignant neoplasm of digestive organs: Secondary | ICD-10-CM

## 2022-07-27 MED ORDER — NA SULFATE-K SULFATE-MG SULF 17.5-3.13-1.6 GM/177ML PO SOLN: 1.0000 | Freq: Once | ORAL | 0 refills | Status: AC

## 2022-07-27 NOTE — Progress Notes (Signed)

## 2022-07-28 ENCOUNTER — Other Ambulatory Visit: Payer: Self-pay | Admitting: Internal Medicine

## 2022-07-28 DIAGNOSIS — E118 Type 2 diabetes mellitus with unspecified complications: Secondary | ICD-10-CM

## 2022-07-28 MED ORDER — TIRZEPATIDE 5 MG/0.5ML ~~LOC~~ SOAJ: 5.0000 mg | SUBCUTANEOUS | 0 refills | Status: DC

## 2022-08-05 DIAGNOSIS — C7A011 Malignant carcinoid tumor of the jejunum: Secondary | ICD-10-CM | POA: Diagnosis not present

## 2022-08-24 ENCOUNTER — Encounter: Payer: Self-pay | Admitting: Internal Medicine

## 2022-08-28 ENCOUNTER — Ambulatory Visit (AMBULATORY_SURGERY_CENTER): Payer: BC Managed Care – PPO | Admitting: Internal Medicine

## 2022-08-28 ENCOUNTER — Encounter: Payer: Self-pay | Admitting: Internal Medicine

## 2022-08-28 VITALS — BP 107/72 | HR 80 | Temp 96.9°F | Resp 15 | Ht 71.0 in | Wt 224.0 lb

## 2022-08-28 DIAGNOSIS — Z09 Encounter for follow-up examination after completed treatment for conditions other than malignant neoplasm: Secondary | ICD-10-CM

## 2022-08-28 DIAGNOSIS — Z800 Family history of malignant neoplasm of digestive organs: Secondary | ICD-10-CM | POA: Diagnosis not present

## 2022-08-28 DIAGNOSIS — K648 Other hemorrhoids: Secondary | ICD-10-CM | POA: Diagnosis not present

## 2022-08-28 DIAGNOSIS — Z1211 Encounter for screening for malignant neoplasm of colon: Secondary | ICD-10-CM | POA: Diagnosis not present

## 2022-08-28 DIAGNOSIS — Z86010 Personal history of colonic polyps: Secondary | ICD-10-CM | POA: Diagnosis not present

## 2022-08-28 MED ORDER — SODIUM CHLORIDE 0.9 % IV SOLN: 500.0000 mL | INTRAVENOUS | Status: DC

## 2022-08-28 NOTE — Op Note (Signed)
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 The patient's tolerance of                            previous anesthesia was also reviewed. The risks                            and benefits of the procedure and the sedation                            options and risks were discussed with the patient.                            All questions were answered, and informed consent                            was obtained. Prior Anticoagulants: The patient has                            taken no anticoagulant or antiplatelet agents. ASA                            Grade Assessment: III - A patient with severe  systemic disease. After reviewing the risks and                            benefits, the patient was deemed in satisfactory                            condition to undergo the procedure.                           After obtaining informed consent, the colonoscope                            was passed under direct vision. Throughout the                            procedure, the patient's  blood pressure, pulse, and                            oxygen saturations were monitored continuously. The                            CF [id number] #[id number] was introduced through the anus                            and advanced to the cecum. The colonoscopy was                            performed without difficulty. The patient tolerated                            the procedure well. The quality of the bowel                            preparation was good. The ileocecal valve,                            appendiceal orifice, and rectum were photographed. Scope In: 9:45:24 AM Scope Out: 10:00:09 AM Scope Withdrawal Time: 0 hours 10 minutes 59 seconds  Total Procedure Duration: 0 hours 14 minutes 45 seconds  Findings:                 The digital rectal exam was normal.                           The colon (entire examined portion) appeared normal.                           Internal hemorrhoids were found during                            retroflexion. The hemorrhoids were small. Complications:            No immediate complications. Estimated Blood Loss:     Estimated blood loss: none. Impression:               -  The entire examined colon is normal.                           - Small internal hemorrhoids.                           - No specimens collected. Recommendation:           - Patient has a contact number available for                            emergencies. The signs and symptoms of potential                            delayed complications were discussed with the                            patient. Return to normal activities tomorrow.                            Written discharge instructions were provided to the                            patient.                           - Resume previous diet.                           - Continue present medications.                           - Repeat colonoscopy in 5 years for surveillance. Jerene Bears, MD 08/28/2022 10:03:00 AM This report has  been signed electronically.

## 2022-08-28 NOTE — Progress Notes (Signed)
Sedate, gd SR, tolerated procedure well, VSS, report to RN 

## 2022-08-28 NOTE — Progress Notes (Signed)
Pt's states no medical or surgical changes since previsit or office visit. 

## 2022-08-28 NOTE — Patient Instructions (Signed)
   Handout on hemorrhoids given to you today   YOU HAD AN ENDOSCOPIC PROCEDURE TODAY AT [facility]:   Refer to the procedure report that was given to you for any specific questions about what was found during the examination.  If the procedure report does not answer your questions, please call your gastroenterologist to clarify.  If you requested that your care partner not be given the details of your procedure findings, then the procedure report has been included in a sealed envelope for you to review at your convenience later.  YOU SHOULD EXPECT: Some feelings of bloating in the abdomen. Passage of more gas than usual.  Walking can help get rid of the air that was put into your GI tract during the procedure and reduce the bloating. If you had a lower endoscopy (such as a colonoscopy or flexible sigmoidoscopy) you may notice spotting of blood in your stool or on the toilet paper. If you underwent a bowel prep for your procedure, you may not have a normal bowel movement for a few days.  Please Note:  You might notice some irritation and congestion in your nose or some drainage.  This is from the oxygen used during your procedure.  There is no need for concern and it should clear up in a day or so.  SYMPTOMS TO REPORT IMMEDIATELY:  Following lower endoscopy (colonoscopy or flexible sigmoidoscopy):  Excessive amounts of blood in the stool  Significant tenderness or worsening of abdominal pains  Swelling of the abdomen that is new, acute  Fever of 100F or higher   For urgent or emergent issues, a gastroenterologist can be reached at any hour by calling [phone]. Do not use MyChart messaging for urgent concerns.    DIET:  We do recommend a small meal at first, but then you may proceed to your regular diet.  Drink plenty of fluids but you should avoid alcoholic beverages for 24 hours.  ACTIVITY:  You should plan to take it easy for the rest of today and you should NOT  DRIVE or use heavy machinery until tomorrow (because of the sedation medicines used during the test).    FOLLOW UP: Our staff will call the number listed on your records the next business day following your procedure.  We will call around 7:15- 8:00 am to check on you and address any questions or concerns that you may have regarding the information given to you following your procedure. If we do not reach you, we will leave a message.     If any biopsies were taken you will be contacted by phone or by letter within the next 1-3 weeks.  Please call us at [phone] if you have not heard about the biopsies in 3 weeks.    SIGNATURES/CONFIDENTIALITY: You and/or your care partner have signed paperwork which will be entered into your electronic medical record.  These signatures attest to the fact that that the information above on your After Visit Summary has been reviewed and is understood.  Full responsibility of the confidentiality of this discharge information lies with you and/or your care-partner.

## 2022-08-31 ENCOUNTER — Telehealth: Payer: Self-pay | Admitting: *Deleted

## 2022-08-31 NOTE — Telephone Encounter (Signed)
  Follow up Call-     08/28/2022    8:39 AM  Call back number  Post procedure Call Back phone  # [phone]  Permission to leave phone message Yes     Patient questions:  Do you have a fever, pain , or abdominal swelling? No. Pain Score  0 *  Have you tolerated food without any problems? Yes.    Have you been able to return to your normal activities? Yes.    Do you have any questions about your discharge instructions: Diet   No. Medications  No. Follow up visit  No.  Do you have questions or concerns about your Care? No.  Actions: * If pain score is 4 or above: No action needed, pain <4.

## 2022-09-02 DIAGNOSIS — C7A011 Malignant carcinoid tumor of the jejunum: Secondary | ICD-10-CM | POA: Diagnosis not present

## 2022-09-28 ENCOUNTER — Encounter: Payer: Self-pay | Admitting: Internal Medicine

## 2022-09-30 DIAGNOSIS — C7B8 Other secondary neuroendocrine tumors: Secondary | ICD-10-CM | POA: Diagnosis not present

## 2022-09-30 DIAGNOSIS — Z800 Family history of malignant neoplasm of digestive organs: Secondary | ICD-10-CM | POA: Diagnosis not present

## 2022-09-30 DIAGNOSIS — M48061 Spinal stenosis, lumbar region without neurogenic claudication: Secondary | ICD-10-CM | POA: Diagnosis not present

## 2022-09-30 DIAGNOSIS — K56600 Partial intestinal obstruction, unspecified as to cause: Secondary | ICD-10-CM | POA: Diagnosis not present

## 2022-09-30 DIAGNOSIS — Z79899 Other long term (current) drug therapy: Secondary | ICD-10-CM | POA: Diagnosis not present

## 2022-09-30 DIAGNOSIS — Z803 Family history of malignant neoplasm of breast: Secondary | ICD-10-CM | POA: Diagnosis not present

## 2022-09-30 DIAGNOSIS — C7A8 Other malignant neuroendocrine tumors: Secondary | ICD-10-CM | POA: Diagnosis not present

## 2022-09-30 DIAGNOSIS — M25552 Pain in left hip: Secondary | ICD-10-CM | POA: Diagnosis not present

## 2022-09-30 DIAGNOSIS — C7A00 Malignant carcinoid tumor of unspecified site: Secondary | ICD-10-CM | POA: Diagnosis not present

## 2022-09-30 DIAGNOSIS — Z5111 Encounter for antineoplastic chemotherapy: Secondary | ICD-10-CM | POA: Diagnosis not present

## 2022-09-30 DIAGNOSIS — Z801 Family history of malignant neoplasm of trachea, bronchus and lung: Secondary | ICD-10-CM | POA: Diagnosis not present

## 2022-09-30 DIAGNOSIS — C7B02 Secondary carcinoid tumors of liver: Secondary | ICD-10-CM | POA: Diagnosis not present

## 2022-09-30 DIAGNOSIS — C7A011 Malignant carcinoid tumor of the jejunum: Secondary | ICD-10-CM | POA: Diagnosis not present

## 2022-09-30 DIAGNOSIS — M25551 Pain in right hip: Secondary | ICD-10-CM | POA: Diagnosis not present

## 2022-09-30 DIAGNOSIS — R739 Hyperglycemia, unspecified: Secondary | ICD-10-CM | POA: Diagnosis not present

## 2022-10-19 ENCOUNTER — Other Ambulatory Visit: Payer: Self-pay | Admitting: Internal Medicine

## 2022-10-19 DIAGNOSIS — E118 Type 2 diabetes mellitus with unspecified complications: Secondary | ICD-10-CM

## 2022-10-19 MED ORDER — TIRZEPATIDE 7.5 MG/0.5ML ~~LOC~~ SOAJ: 7.5000 mg | SUBCUTANEOUS | 0 refills | Status: DC

## 2022-10-28 DIAGNOSIS — C7A011 Malignant carcinoid tumor of the jejunum: Secondary | ICD-10-CM | POA: Diagnosis not present

## 2022-10-29 DIAGNOSIS — J324 Chronic pansinusitis: Secondary | ICD-10-CM | POA: Diagnosis not present

## 2022-10-29 DIAGNOSIS — R070 Pain in throat: Secondary | ICD-10-CM | POA: Diagnosis not present

## 2022-10-29 DIAGNOSIS — M791 Myalgia, unspecified site: Secondary | ICD-10-CM | POA: Diagnosis not present

## 2022-11-12 ENCOUNTER — Encounter: Payer: Self-pay | Admitting: Internal Medicine

## 2022-11-12 ENCOUNTER — Other Ambulatory Visit: Payer: Self-pay | Admitting: Internal Medicine

## 2022-11-12 DIAGNOSIS — Z1211 Encounter for screening for malignant neoplasm of colon: Secondary | ICD-10-CM | POA: Insufficient documentation

## 2022-11-23 DIAGNOSIS — C7A011 Malignant carcinoid tumor of the jejunum: Secondary | ICD-10-CM | POA: Diagnosis not present

## 2022-11-23 DIAGNOSIS — C787 Secondary malignant neoplasm of liver and intrahepatic bile duct: Secondary | ICD-10-CM | POA: Diagnosis not present

## 2022-11-25 DIAGNOSIS — C7A011 Malignant carcinoid tumor of the jejunum: Secondary | ICD-10-CM | POA: Diagnosis not present

## 2022-11-25 DIAGNOSIS — E875 Hyperkalemia: Secondary | ICD-10-CM | POA: Diagnosis not present

## 2022-12-07 ENCOUNTER — Other Ambulatory Visit: Payer: Self-pay | Admitting: Internal Medicine

## 2022-12-07 DIAGNOSIS — E118 Type 2 diabetes mellitus with unspecified complications: Secondary | ICD-10-CM

## 2022-12-08 MED ORDER — TIRZEPATIDE 7.5 MG/0.5ML ~~LOC~~ SOAJ: 7.5000 mg | SUBCUTANEOUS | 0 refills | Status: DC

## 2022-12-23 DIAGNOSIS — C7A011 Malignant carcinoid tumor of the jejunum: Secondary | ICD-10-CM | POA: Diagnosis not present

## 2022-12-27 ENCOUNTER — Other Ambulatory Visit: Payer: Self-pay | Admitting: Internal Medicine

## 2022-12-27 DIAGNOSIS — E785 Hyperlipidemia, unspecified: Secondary | ICD-10-CM

## 2023-01-04 ENCOUNTER — Ambulatory Visit: Payer: BC Managed Care – PPO | Admitting: Internal Medicine

## 2023-01-04 ENCOUNTER — Encounter: Payer: Self-pay | Admitting: Internal Medicine

## 2023-01-04 VITALS — BP 116/60 | HR 95 | Temp 97.9°F | Ht 71.0 in | Wt 196.0 lb

## 2023-01-04 DIAGNOSIS — I10 Essential (primary) hypertension: Secondary | ICD-10-CM | POA: Diagnosis not present

## 2023-01-04 DIAGNOSIS — M48061 Spinal stenosis, lumbar region without neurogenic claudication: Secondary | ICD-10-CM

## 2023-01-04 DIAGNOSIS — M160 Bilateral primary osteoarthritis of hip: Secondary | ICD-10-CM | POA: Diagnosis not present

## 2023-01-04 DIAGNOSIS — E785 Hyperlipidemia, unspecified: Secondary | ICD-10-CM

## 2023-01-04 DIAGNOSIS — K219 Gastro-esophageal reflux disease without esophagitis: Secondary | ICD-10-CM

## 2023-01-04 DIAGNOSIS — E118 Type 2 diabetes mellitus with unspecified complications: Secondary | ICD-10-CM | POA: Diagnosis not present

## 2023-01-04 LAB — BASIC METABOLIC PANEL
BUN: 15 mg/dL (ref 6–23)
CO2: 27 mEq/L (ref 19–32)
Calcium: 9.1 mg/dL (ref 8.4–10.5)
Chloride: 105 mEq/L (ref 96–112)
Creatinine, Ser: 0.93 mg/dL (ref 0.40–1.50)
GFR: 89.34 mL/min (ref >60.00)
Glucose, Bld: 107 mg/dL — ABNORMAL HIGH (ref 70–99)
Potassium: 4.2 mEq/L (ref 3.5–5.1)
Sodium: 139 mEq/L (ref 135–145)

## 2023-01-04 LAB — MICROALBUMIN / CREATININE URINE RATIO
Creatinine,U: 183.8 mg/dL
Microalb Creat Ratio: 1.8 mg/g (ref 0.0–30.0)
Microalb, Ur: 3.3 mg/dL — ABNORMAL HIGH (ref 0.0–1.9)

## 2023-01-04 LAB — URINALYSIS, ROUTINE W REFLEX MICROSCOPIC
Bilirubin Urine: NEGATIVE
Hgb urine dipstick: NEGATIVE
Ketones, ur: NEGATIVE
Leukocytes,Ua: NEGATIVE
Nitrite: NEGATIVE
Specific Gravity, Urine: 1.025 (ref 1.000–1.030)
Total Protein, Urine: NEGATIVE
Urine Glucose: NEGATIVE
Urobilinogen, UA: 2 — AB (ref 0.0–1.0)
pH: 6 (ref 5.0–8.0)

## 2023-01-04 LAB — CBC WITH DIFFERENTIAL/PLATELET
Basophils Absolute: 0 10*3/uL (ref 0.0–0.1)
Basophils Relative: 0.7 % (ref 0.0–3.0)
Eosinophils Absolute: 0.1 10*3/uL (ref 0.0–0.7)
Eosinophils Relative: 1.6 % (ref 0.0–5.0)
HCT: 41.3 % (ref 39.0–52.0)
Hemoglobin: 14.3 g/dL (ref 13.0–17.0)
Lymphocytes Relative: 28.3 % (ref 12.0–46.0)
Lymphs Abs: 1.8 10*3/uL (ref 0.7–4.0)
MCHC: 34.7 g/dL (ref 30.0–36.0)
MCV: 89.9 fl (ref 78.0–100.0)
Monocytes Absolute: 0.4 10*3/uL (ref 0.1–1.0)
Monocytes Relative: 6.8 % (ref 3.0–12.0)
Neutro Abs: 4 10*3/uL (ref 1.4–7.7)
Neutrophils Relative %: 62.6 % (ref 43.0–77.0)
Platelets: 190 10*3/uL (ref 150.0–400.0)
RBC: 4.59 Mil/uL (ref 4.22–5.81)
RDW: 13.8 % (ref 11.5–15.5)
WBC: 6.3 10*3/uL (ref 4.0–10.5)

## 2023-01-04 LAB — HEPATIC FUNCTION PANEL
ALT: 26 U/L (ref 0–53)
AST: 21 U/L (ref 0–37)
Albumin: 4.3 g/dL (ref 3.5–5.2)
Alkaline Phosphatase: 51 U/L (ref 39–117)
Bilirubin, Direct: 0.2 mg/dL (ref 0.0–0.3)
Total Bilirubin: 0.6 mg/dL (ref 0.2–1.2)
Total Protein: 7 g/dL (ref 6.0–8.3)

## 2023-01-04 LAB — HEMOGLOBIN A1C: Hgb A1c MFr Bld: 5.3 % (ref 4.6–6.5)

## 2023-01-04 MED ORDER — TRAMADOL HCL ER 100 MG PO TB24
100.0000 mg | ORAL_TABLET | Freq: Every day | ORAL | 1 refills | Status: DC | PRN
Start: 2023-01-04 — End: 2023-07-12

## 2023-01-04 NOTE — Progress Notes (Unsigned)
Subjective:  Patient ID: Henry Lang, male    DOB: 1961-09-13  Age: 62 y.o. MRN: [id number]  CC: Osteoarthritis, Diabetes, and Back Pain   HPI Henry Lang presents for f/up -  He has intentionally lost weight with lifestyle modifications.  He is active and denies chest pain, shortness of breath, diaphoresis, edema, dizziness, or lightheadedness.  Outpatient Medications Prior to Visit  Medication Sig Dispense Refill   diclofenac (VOLTAREN) 75 MG EC tablet Take 75 mg by mouth 2 (two) times daily.     lanreotide acetate (SOMATULINE DEPOT) 120 MG/0.5ML injection Inject 120 mg into the skin every 28 (twenty-eight) days. Go to [facility] clinic monthly for injection to treat neuroendocrine tumor     rosuvastatin (CRESTOR) 5 MG tablet TAKE 1 TABLET(5 MG) BY MOUTH DAILY 90 tablet 1   tirzepatide (MOUNJARO) 7.5 MG/0.5ML Pen Inject 7.5 mg into the skin once a week. 4 mL 0   acyclovir cream (ZOVIRAX) 5 % Apply topically.     Continuous Blood Gluc Receiver (DEXCOM G7 RECEIVER) DEVI 1 Act by Does not apply route daily. 2 each 5   Continuous Blood Gluc Sensor (DEXCOM G7 SENSOR) MISC APPLY SENSOR AS DIRECTED 2 each 5   No facility-administered medications prior to visit.    ROS Review of Systems  Constitutional: Negative.  Negative for diaphoresis and fatigue.  HENT: Negative.    Eyes: Negative.   Respiratory:  Negative for cough, chest tightness, shortness of breath and wheezing.   Cardiovascular:  Negative for chest pain, palpitations and leg swelling.  Gastrointestinal:  Negative for abdominal pain, constipation, diarrhea, nausea and vomiting.  Endocrine: Negative.   Genitourinary: Negative.  Negative for difficulty urinating and dysuria.  Musculoskeletal:  Positive for arthralgias and back pain. Negative for myalgias and neck pain.  Skin: Negative.  Negative for color change and pallor.  Neurological: Negative.  Negative for dizziness, weakness and light-headedness.  Hematological:   Negative for adenopathy. Does not bruise/bleed easily.  Psychiatric/Behavioral: Negative.      Objective:  BP 116/60 (BP Location: Right Arm, Patient Position: Sitting, Cuff Size: Large)   Pulse 95   Temp 97.9 F (36.6 C) (Oral)   Ht  (1.803 m)   Wt 196 lb (88.9 kg)   SpO2 96%   BMI 27.34 kg/m   BP Readings from Last 3 Encounters:  01/04/23 116/60  08/28/22 107/72  07/02/22 116/78    Wt Readings from Last 3 Encounters:  01/04/23 196 lb (88.9 kg)  08/28/22 224 lb (101.6 kg)  07/27/22 224 lb (101.6 kg)    Physical Exam Vitals reviewed.  Constitutional:      Appearance: He is not ill-appearing.  HENT:     Nose: Nose normal.     Mouth/Throat:     Mouth: Mucous membranes are moist.  Eyes:     General: No scleral icterus.    Conjunctiva/sclera: Conjunctivae normal.  Cardiovascular:     Rate and Rhythm: Normal rate and regular rhythm.     Heart sounds: No murmur heard.    No friction rub. No gallop.  Pulmonary:     Effort: Pulmonary effort is normal.     Breath sounds: No stridor. No wheezing, rhonchi or rales.  Abdominal:     General: Abdomen is flat.     Palpations: There is no mass.     Tenderness: There is no abdominal tenderness. There is no guarding.     Hernia: No hernia is present.  Musculoskeletal:  General: Normal range of motion.     Cervical back: Neck supple.     Right lower leg: No edema.     Left lower leg: No edema.  Lymphadenopathy:     Cervical: No cervical adenopathy.  Skin:    General: Skin is warm and dry.     Coloration: Skin is not pale.  Neurological:     General: No focal deficit present.     Mental Status: He is alert. Mental status is at baseline.  Psychiatric:        Mood and Affect: Mood normal.        Behavior: Behavior normal.     Lab Results  Component Value Date   WBC 6.3 01/04/2023   HGB 14.3 01/04/2023   HCT 41.3 01/04/2023   PLT 190.0 01/04/2023   GLUCOSE 107 (H) 01/04/2023   CHOL 84 07/02/2022    TRIG 70.0 07/02/2022   HDL 35.20 (L) 07/02/2022   LDLCALC 34 07/02/2022   ALT 26 01/04/2023   AST 21 01/04/2023   NA 139 01/04/2023   K 4.2 01/04/2023   CL 105 01/04/2023   CREATININE 0.93 01/04/2023   BUN 15 01/04/2023   CO2 27 01/04/2023   TSH 3.17 07/02/2022   PSA 0.34 07/02/2022   INR 0.99 08/04/2018   HGBA1C 5.3 01/04/2023   MICROALBUR 3.3 (H) 01/04/2023    NM PET (NETSPOT GA 68 DOTATATE) SKULL BASE TO MID THIGH  Result Date: 08/30/2018 CLINICAL DATA:  Well differentiated neuroendocrine tumor. EXAM: NUCLEAR MEDICINE PET SKULL BASE TO THIGH TECHNIQUE: mCi Ga 14 DOTATATE was injected intravenously. Full-ring PET imaging was performed from the skull base to thigh after the radiotracer. CT data was obtained and used for attenuation correction and anatomic localization. COMPARISON:  None. FINDINGS: NECK No radiotracer activity in neck lymph nodes. Incidental CT findings: None CHEST No radiotracer accumulation within mediastinal or hilar lymph nodes. No suspicious pulmonary nodules on the CT scan. Incidental CT finding:4 mm RIGHT lower lobe nodule on image 88/4. No associated radiotracer activity. Posterior RIGHT lower lobe nodule measuring 4 mm on image 87/4 also without metabolic activity. Radiotracer activity ABDOMEN/PELVIS Intense radiotracer activity associated with a small mesenteric/small-bowel lesion in the RIGHT abdomen. Lesion is difficult to identify on the noncontrast CT but does localize to a 14 mm lesion adjacent the small bowel on image 147/4. The radiotracer activity is intense with SUV max equal 15.6. Small adjacent mesenteric nodule measuring 8 mm (image 42/4) also with radiotracer activity (SUV max equal 9.7). A third focus of radiotracer activity associated small bowel in the RIGHT abdomen just anterior to the ascending colon with SUV max equal 12.0. No clear CT lesion identified. Lesion found on image 152 of the fused data set. Hepatic metastasis seen on comparison MRI  (07/05/2018) are not as clearly depicted on the DOTATATE PET scan. There are 2 lesions in the RIGHT hepatic lobe with radiotracer activity above background measuring approximately 1 cm each on image 101 of the fused data set. SUV max equal 14.8 and 13.8 respectively. Background liver activity is 12.3. Physiologic activity noted in the adrenal glands, spleen and kidneys. Incidental CT findings:None SKELETON No focal activity to suggest skeletal metastasis. Incidental CT findings:None IMPRESSION: 1. Small (1.5) metastatic mesentery lesion versus primary small bowel lesion in the RIGHT abdomen with intense radiotracer activity. Findings consistent well differentiated neuroendocrine tumor. 2. Small nodular mesenteric implant adjacent to the above dominant lesion. 3. Small implant along the RIGHT abdomen associated with the small  bowel without CT correlation. 4. At least 2 hepatic metastasis accumulate the somatostatin specific radiotracer radiotracer. The degree of radiotracer uptake within liver lesions is less than expected for well differentiated neuroendocrine tumor. This may be in part due to high background liver activity. Electronically Signed   By: Genevive Bi M.D.   On: 08/30/2018 15:58    Assessment & Plan:   Gastroesophageal reflux disease without esophagitis -     CBC with Differential/Platelet; Future  Essential hypertension- His blood pressure is well-controlled. -     Basic metabolic panel; Future -     CBC with Differential/Platelet; Future -     Urinalysis, Routine w reflex microscopic; Future  Primary osteoarthritis of both hips -     traMADol HCl ER; Take 1 tablet (100 mg total) by mouth daily as needed for pain.  Dispense: 90 tablet; Refill: 1  Spinal stenosis, lumbar region, without neurogenic claudication -     traMADol HCl ER; Take 1 tablet (100 mg total) by mouth daily as needed for pain.  Dispense: 90 tablet; Refill: 1  Type II diabetes mellitus with manifestations- His  blood sugar is well-controlled. -     Basic metabolic panel; Future -     Urinalysis, Routine w reflex microscopic; Future -     Hemoglobin A1c; Future -     Microalbumin / creatinine urine ratio; Future  Hyperlipidemia LDL goal <70- LDL goal achieved. Doing well on the statin  -     Hepatic function panel; Future     Follow-up: Return in about 6 months (around 07/06/2023).  Sanda Linger, MD

## 2023-01-04 NOTE — Patient Instructions (Signed)

## 2023-01-19 DIAGNOSIS — N3091 Cystitis, unspecified with hematuria: Secondary | ICD-10-CM | POA: Diagnosis not present

## 2023-01-19 DIAGNOSIS — R300 Dysuria: Secondary | ICD-10-CM | POA: Diagnosis not present

## 2023-01-20 DIAGNOSIS — C7A011 Malignant carcinoid tumor of the jejunum: Secondary | ICD-10-CM | POA: Diagnosis not present

## 2023-01-21 ENCOUNTER — Telehealth: Payer: Self-pay

## 2023-01-21 NOTE — Telephone Encounter (Signed)
PA initiated via Covermymeds; KEY: [id number]. Awaiting determination.

## 2023-01-29 NOTE — Telephone Encounter (Signed)
Called [employer]-[phone] option 3, then option 3 again. was on phone w/ 2 different people, Colin Mulders B and Jimmy Footman for > 20 mins. They have faxed over a PA form to [phone] times, they are needing that form completed and faxed back.

## 2023-02-01 ENCOUNTER — Other Ambulatory Visit: Payer: Self-pay | Admitting: Internal Medicine

## 2023-02-01 DIAGNOSIS — E118 Type 2 diabetes mellitus with unspecified complications: Secondary | ICD-10-CM

## 2023-02-01 MED ORDER — TIRZEPATIDE 7.5 MG/0.5ML ~~LOC~~ SOAJ
7.5000 mg | SUBCUTANEOUS | 0 refills | Status: DC
Start: 2023-02-01 — End: 2023-03-22

## 2023-02-05 ENCOUNTER — Other Ambulatory Visit (HOSPITAL_COMMUNITY): Payer: Self-pay

## 2023-02-05 ENCOUNTER — Telehealth: Payer: Self-pay

## 2023-02-05 NOTE — Telephone Encounter (Signed)
PA submitted via Blue-E. Created new encounter for PA. Will route back to pool once determination has been made.

## 2023-02-05 NOTE — Telephone Encounter (Signed)
Pharmacy Patient Advocate Encounter   Received notification that prior authorization for Tramadol ER 100mg  is required/requested.   PA submitted on 02/05/23 to (ins) [employer] via [employer] or [facility]) confirmation # [id number] Status is pending

## 2023-02-11 NOTE — Telephone Encounter (Signed)
See other note

## 2023-02-12 NOTE — Telephone Encounter (Signed)
Checked status of PA on BlueE. PA was cancelled. Called insurance at [phone], PA was closed because initial request and provider courtesy request were both denied (due to only using as needed, insurance covers for around the clock therapy). Next step would be to appeal. Appeal form and Member Appeal Representation Authorization Form is required. They are both available on [email or web address]. Click on members, then member form, then scroll down and under appeal forms, you will find Member Appeal Form & Member Appeal Representation Authorization Form.

## 2023-02-17 DIAGNOSIS — C7A011 Malignant carcinoid tumor of the jejunum: Secondary | ICD-10-CM | POA: Diagnosis not present

## 2023-03-17 DIAGNOSIS — C7A011 Malignant carcinoid tumor of the jejunum: Secondary | ICD-10-CM | POA: Diagnosis not present

## 2023-03-22 ENCOUNTER — Other Ambulatory Visit: Payer: Self-pay | Admitting: Internal Medicine

## 2023-03-22 DIAGNOSIS — E118 Type 2 diabetes mellitus with unspecified complications: Secondary | ICD-10-CM

## 2023-03-22 DIAGNOSIS — H52223 Regular astigmatism, bilateral: Secondary | ICD-10-CM | POA: Diagnosis not present

## 2023-03-22 DIAGNOSIS — H524 Presbyopia: Secondary | ICD-10-CM | POA: Diagnosis not present

## 2023-03-22 DIAGNOSIS — E119 Type 2 diabetes mellitus without complications: Secondary | ICD-10-CM | POA: Diagnosis not present

## 2023-03-22 LAB — HM DIABETES EYE EXAM

## 2023-03-22 MED ORDER — TIRZEPATIDE 7.5 MG/0.5ML ~~LOC~~ SOAJ
7.5000 mg | SUBCUTANEOUS | 0 refills | Status: DC
Start: 2023-03-22 — End: 2023-04-22

## 2023-04-14 DIAGNOSIS — C7A011 Malignant carcinoid tumor of the jejunum: Secondary | ICD-10-CM | POA: Diagnosis not present

## 2023-04-15 ENCOUNTER — Encounter: Payer: Self-pay | Admitting: Internal Medicine

## 2023-04-22 ENCOUNTER — Other Ambulatory Visit: Payer: Self-pay | Admitting: Internal Medicine

## 2023-04-22 DIAGNOSIS — E118 Type 2 diabetes mellitus with unspecified complications: Secondary | ICD-10-CM

## 2023-04-22 MED ORDER — DICLOFENAC SODIUM 75 MG PO TBEC: 75.0000 mg | DELAYED_RELEASE_TABLET | Freq: Two times a day (BID) | ORAL | 0 refills | Status: DC

## 2023-04-22 MED ORDER — TIRZEPATIDE 7.5 MG/0.5ML ~~LOC~~ SOAJ: 7.5000 mg | SUBCUTANEOUS | 0 refills | Status: DC

## 2023-04-30 ENCOUNTER — Encounter: Payer: Self-pay | Admitting: Internal Medicine

## 2023-04-30 DIAGNOSIS — Z0279 Encounter for issue of other medical certificate: Secondary | ICD-10-CM

## 2023-04-30 NOTE — Telephone Encounter (Signed)
Form has been printed, completed and given to PCP to sign.

## 2023-05-10 DIAGNOSIS — C7A011 Malignant carcinoid tumor of the jejunum: Secondary | ICD-10-CM | POA: Diagnosis not present

## 2023-05-10 DIAGNOSIS — R918 Other nonspecific abnormal finding of lung field: Secondary | ICD-10-CM | POA: Diagnosis not present

## 2023-05-12 DIAGNOSIS — E875 Hyperkalemia: Secondary | ICD-10-CM | POA: Diagnosis not present

## 2023-05-12 DIAGNOSIS — C7A011 Malignant carcinoid tumor of the jejunum: Secondary | ICD-10-CM | POA: Diagnosis not present

## 2023-05-23 ENCOUNTER — Other Ambulatory Visit: Payer: Self-pay | Admitting: Internal Medicine

## 2023-05-23 DIAGNOSIS — E118 Type 2 diabetes mellitus with unspecified complications: Secondary | ICD-10-CM

## 2023-06-09 DIAGNOSIS — C7A011 Malignant carcinoid tumor of the jejunum: Secondary | ICD-10-CM | POA: Diagnosis not present

## 2023-06-17 ENCOUNTER — Other Ambulatory Visit: Payer: Self-pay | Admitting: Internal Medicine

## 2023-06-17 DIAGNOSIS — E118 Type 2 diabetes mellitus with unspecified complications: Secondary | ICD-10-CM

## 2023-06-25 ENCOUNTER — Telehealth: Payer: Self-pay | Admitting: Pharmacy Technician

## 2023-06-25 NOTE — Telephone Encounter (Signed)
Pharmacy Patient Advocate Encounter   Received notification from CoverMyMeds that prior authorization for mounjaro is required/requested.   Insurance verification completed.   The patient is insured through [facility] .   Per test claim: PA required; PA started via CoverMyMeds. KEY [id number] . Waiting for clinical questions to populate.

## 2023-06-30 ENCOUNTER — Other Ambulatory Visit: Payer: Self-pay | Admitting: Internal Medicine

## 2023-06-30 DIAGNOSIS — E785 Hyperlipidemia, unspecified: Secondary | ICD-10-CM

## 2023-07-01 NOTE — Telephone Encounter (Signed)
Clinical questions answered. PA submitted

## 2023-07-07 DIAGNOSIS — C7A011 Malignant carcinoid tumor of the jejunum: Secondary | ICD-10-CM | POA: Diagnosis not present

## 2023-07-12 ENCOUNTER — Ambulatory Visit: Payer: BC Managed Care – PPO | Admitting: Internal Medicine

## 2023-07-12 ENCOUNTER — Encounter: Payer: Self-pay | Admitting: Internal Medicine

## 2023-07-12 VITALS — BP 122/78 | HR 67 | Temp 97.7°F | Resp 16 | Ht 71.0 in | Wt 189.6 lb

## 2023-07-12 DIAGNOSIS — E785 Hyperlipidemia, unspecified: Secondary | ICD-10-CM

## 2023-07-12 DIAGNOSIS — Z0000 Encounter for general adult medical examination without abnormal findings: Secondary | ICD-10-CM | POA: Diagnosis not present

## 2023-07-12 DIAGNOSIS — I10 Essential (primary) hypertension: Secondary | ICD-10-CM | POA: Diagnosis not present

## 2023-07-12 DIAGNOSIS — K219 Gastro-esophageal reflux disease without esophagitis: Secondary | ICD-10-CM

## 2023-07-12 DIAGNOSIS — E118 Type 2 diabetes mellitus with unspecified complications: Secondary | ICD-10-CM | POA: Diagnosis not present

## 2023-07-12 DIAGNOSIS — M160 Bilateral primary osteoarthritis of hip: Secondary | ICD-10-CM

## 2023-07-12 DIAGNOSIS — M48061 Spinal stenosis, lumbar region without neurogenic claudication: Secondary | ICD-10-CM

## 2023-07-12 DIAGNOSIS — D539 Nutritional anemia, unspecified: Secondary | ICD-10-CM | POA: Insufficient documentation

## 2023-07-12 LAB — CK: Total CK: 82 U/L (ref 7–232)

## 2023-07-12 LAB — CBC WITH DIFFERENTIAL/PLATELET
Basophils Absolute: 0 10*3/uL (ref 0.0–0.1)
Basophils Relative: 0.7 % (ref 0.0–3.0)
Eosinophils Absolute: 0.1 10*3/uL (ref 0.0–0.7)
Eosinophils Relative: 1.9 % (ref 0.0–5.0)
HCT: 37.6 % — ABNORMAL LOW (ref 39.0–52.0)
Hemoglobin: 12.7 g/dL — ABNORMAL LOW (ref 13.0–17.0)
Lymphocytes Relative: 24.9 % (ref 12.0–46.0)
Lymphs Abs: 1.2 10*3/uL (ref 0.7–4.0)
MCHC: 33.7 g/dL (ref 30.0–36.0)
MCV: 90.9 fL (ref 78.0–100.0)
Monocytes Absolute: 0.3 10*3/uL (ref 0.1–1.0)
Monocytes Relative: 7 % (ref 3.0–12.0)
Neutro Abs: 3.1 10*3/uL (ref 1.4–7.7)
Neutrophils Relative %: 65.5 % (ref 43.0–77.0)
Platelets: 186 10*3/uL (ref 150.0–400.0)
RBC: 4.14 Mil/uL — ABNORMAL LOW (ref 4.22–5.81)
RDW: 13.4 % (ref 11.5–15.5)
WBC: 4.8 10*3/uL (ref 4.0–10.5)

## 2023-07-12 LAB — BASIC METABOLIC PANEL
BUN: 17 mg/dL (ref 6–23)
CO2: 30 meq/L (ref 19–32)
Calcium: 9.2 mg/dL (ref 8.4–10.5)
Chloride: 106 meq/L (ref 96–112)
Creatinine, Ser: 0.97 mg/dL (ref 0.40–1.50)
GFR: 84.63 mL/min (ref >60.00)
Glucose, Bld: 99 mg/dL (ref 70–99)
Potassium: 4.8 meq/L (ref 3.5–5.1)
Sodium: 141 meq/L (ref 135–145)

## 2023-07-12 LAB — HEPATIC FUNCTION PANEL
ALT: 25 U/L (ref 0–53)
AST: 19 U/L (ref 0–37)
Albumin: 4.2 g/dL (ref 3.5–5.2)
Alkaline Phosphatase: 50 U/L (ref 39–117)
Bilirubin, Direct: 0.1 mg/dL (ref 0.0–0.3)
Total Bilirubin: 0.5 mg/dL (ref 0.2–1.2)
Total Protein: 6.5 g/dL (ref 6.0–8.3)

## 2023-07-12 LAB — LIPID PANEL
Cholesterol: 87 mg/dL (ref 0–200)
HDL: 39.6 mg/dL (ref >39.00)
LDL Cholesterol: 39 mg/dL (ref 0–99)
NonHDL: 47.31
Total CHOL/HDL Ratio: 2
Triglycerides: 42 mg/dL (ref 0.0–149.0)
VLDL: 8.4 mg/dL (ref 0.0–40.0)

## 2023-07-12 LAB — HEMOGLOBIN A1C: Hgb A1c MFr Bld: 5.1 % (ref 4.6–6.5)

## 2023-07-12 LAB — PSA: PSA: 1.19 ng/mL (ref 0.10–4.00)

## 2023-07-12 LAB — C-REACTIVE PROTEIN: CRP: <1 mg/dL (ref 0.5–20.0)

## 2023-07-12 MED ORDER — TRAMADOL HCL ER 100 MG PO TB24: 200.0000 mg | ORAL_TABLET | Freq: Every day | ORAL | 1 refills | Status: DC | PRN

## 2023-07-12 NOTE — Patient Instructions (Signed)
Health Maintenance, Male Adopting a healthy lifestyle and getting preventive care are important in promoting health and wellness. Ask your health care provider about: The right schedule for you to have regular tests and exams. Things you can do on your own to prevent diseases and keep yourself healthy. What should I know about diet, weight, and exercise? Eat a healthy diet  Eat a diet that includes plenty of vegetables, fruits, low-fat dairy products, and lean protein. Do not eat a lot of foods that are high in solid fats, added sugars, or sodium. Maintain a healthy weight Body mass index (BMI) is a measurement that can be used to identify possible weight problems. It estimates body fat based on height and weight. Your health care provider can help determine your BMI and help you achieve or maintain a healthy weight. Get regular exercise Get regular exercise. This is one of the most important things you can do for your health. Most adults should: Exercise for at least 150 minutes each week. The exercise should increase your heart rate and make you sweat (moderate-intensity exercise). Do strengthening exercises at least twice a week. This is in addition to the moderate-intensity exercise. Spend less time sitting. Even light physical activity can be beneficial. Watch cholesterol and blood lipids Have your blood tested for lipids and cholesterol at 61 years of age, then have this test every 5 years. You may need to have your cholesterol levels checked more often if: Your lipid or cholesterol levels are high. You are older than 61 years of age. You are at high risk for heart disease. What should I know about cancer screening? Many types of cancers can be detected early and may often be prevented. Depending on your health history and family history, you may need to have cancer screening at various ages. This may include screening for: Colorectal cancer. Prostate cancer. Skin cancer. Lung  cancer. What should I know about heart disease, diabetes, and high blood pressure? Blood pressure and heart disease High blood pressure causes heart disease and increases the risk of stroke. This is more likely to develop in people who have high blood pressure readings or are overweight. Talk with your health care provider about your target blood pressure readings. Have your blood pressure checked: Every 3-5 years if you are 18-39 years of age. Every year if you are 40 years old or older. If you are between the ages of 65 and 75 and are a current or former smoker, ask your health care provider if you should have a one-time screening for abdominal aortic aneurysm (AAA). Diabetes Have regular diabetes screenings. This checks your fasting blood sugar level. Have the screening done: Once every three years after age 45 if you are at a normal weight and have a low risk for diabetes. More often and at a younger age if you are overweight or have a high risk for diabetes. What should I know about preventing infection? Hepatitis B If you have a higher risk for hepatitis B, you should be screened for this virus. Talk with your health care provider to find out if you are at risk for hepatitis B infection. Hepatitis C Blood testing is recommended for: Everyone born from 1945 through 1965. Anyone with known risk factors for hepatitis C. Sexually transmitted infections (STIs) You should be screened each year for STIs, including gonorrhea and chlamydia, if: You are sexually active and are younger than 61 years of age. You are older than 61 years of age and your   health care provider tells you that you are at risk for this type of infection. Your sexual activity has changed since you were last screened, and you are at increased risk for chlamydia or gonorrhea. Ask your health care provider if you are at risk. Ask your health care provider about whether you are at high risk for HIV. Your health care provider  may recommend a prescription medicine to help prevent HIV infection. If you choose to take medicine to prevent HIV, you should first get tested for HIV. You should then be tested every 3 months for as long as you are taking the medicine. Follow these instructions at home: Alcohol use Do not drink alcohol if your health care provider tells you not to drink. If you drink alcohol: Limit how much you have to 0-2 drinks a day. Know how much alcohol is in your drink. In the [place] one drink equals one 12 oz bottle of beer (355 mL), one 5 oz glass of wine (148 mL), or one 1 oz glass of hard liquor (44 mL). Lifestyle Do not use any products that contain nicotine or tobacco. These products include cigarettes, chewing tobacco, and vaping devices, such as e-cigarettes. If you need help quitting, ask your health care provider. Do not use street drugs. Do not share needles. Ask your health care provider for help if you need support or information about quitting drugs. General instructions Schedule regular health, dental, and eye exams. Stay current with your vaccines. Tell your health care provider if: You often feel depressed. You have ever been abused or do not feel safe at home. Summary Adopting a healthy lifestyle and getting preventive care are important in promoting health and wellness. Follow your health care provider's instructions about healthy diet, exercising, and getting tested or screened for diseases. Follow your health care provider's instructions on monitoring your cholesterol and blood pressure. This information is not intended to replace advice given to you by your health care provider. Make sure you discuss any questions you have with your health care provider. Document Revised: 01/20/2021 Document Reviewed: 01/20/2021 Elsevier Patient Education  2024 [employer]  

## 2023-07-12 NOTE — Progress Notes (Unsigned)
Subjective:  Patient ID: Henry Lang, male    DOB: [birth year]  Age: 62 y.o. MRN: [id number]  CC: Annual Exam, Hypertension, Diabetes, Hyperlipidemia, Gastroesophageal Reflux, and Osteoarthritis   HPI Henry Lang presents for a CPX and f/up ---  Discussed the use of AI scribe software for clinical note transcription with the patient, who gave verbal consent to proceed.  History of Present Illness   Mr. Sapp, a 62 year old patient with a history of arthritis, presents with joint and muscle pain, which he describes as typical for his age. 62 He denies experiencing chest pain, shortness of breath, or dizziness during physical activity. He also denies any symptoms related to blood sugar or side effects from [facility], such as abdominal pain, nausea, vomiting, or constipation.  The patient has been taking tramadol every twelve hours, primarily at night, and diclofenac every twelve hours for his arthritis. He expresses a desire to increase the dose of tramadol, suggesting it might provide more relief.       Outpatient Medications Prior to Visit  Medication Sig Dispense Refill   diclofenac (VOLTAREN) 75 MG EC tablet Take 1 tablet (75 mg total) by mouth 2 (two) times daily. 180 tablet 0   lanreotide acetate (SOMATULINE DEPOT) 120 MG/0.5ML injection Inject 120 mg into the skin every 28 (twenty-eight) days. Go to [facility] clinic monthly for injection to treat neuroendocrine tumor     MOUNJARO 7.5 MG/0.5ML Pen INJECT 7.5MG  INTO THE SKIN ONCE A WEEK. RETURN IN ABOUT 6 MONTHS(AROUND 07/06/2023) 2 mL 0   rosuvastatin (CRESTOR) 5 MG tablet TAKE 1 TABLET(5 MG) BY MOUTH DAILY 90 tablet 0   traMADol (ULTRAM-ER) 100 MG 24 hr tablet Take 1 tablet (100 mg total) by mouth daily as needed for pain. 90 tablet 1   No facility-administered medications prior to visit.    ROS Review of Systems  Constitutional: Negative.  Negative for chills, diaphoresis, fatigue and unexpected weight change.  HENT: Negative.     Eyes: Negative.   Respiratory:  Negative for cough, chest tightness, shortness of breath and wheezing.   Cardiovascular:  Negative for chest pain, palpitations and leg swelling.  Gastrointestinal:  Negative for abdominal pain, blood in stool, constipation, diarrhea, nausea and vomiting.  Endocrine: Negative.   Genitourinary: Negative.  Negative for difficulty urinating.  Musculoskeletal:  Positive for arthralgias and back pain. Negative for myalgias.  Neurological: Negative.  Negative for dizziness.  Hematological:  Negative for adenopathy. Does not bruise/bleed easily.  Psychiatric/Behavioral: Negative.      Objective:  BP 122/78 (BP Location: Left Arm, Patient Position: Sitting, Cuff Size: Normal)   Pulse 67   Temp 97.7 F (36.5 C) (Oral)   Resp 16   Ht 5\' 11"  (1.803 m)   Wt 189 lb 9.6 oz (86 kg)   SpO2 99%   BMI 26.44 kg/m   BP Readings from Last 3 Encounters:  07/12/23 122/78  01/04/23 116/60  08/28/22 107/72    Wt Readings from Last 3 Encounters:  07/12/23 189 lb 9.6 oz (86 kg)  01/04/23 196 lb (88.9 kg)  08/28/22 224 lb (101.6 kg)    Physical Exam Vitals reviewed.  Constitutional:      Appearance: Normal appearance. He is not ill-appearing.  HENT:     Nose: Nose normal.     Mouth/Throat:     Mouth: Mucous membranes are moist.  Eyes:     General: No scleral icterus.    Conjunctiva/sclera: Conjunctivae normal.  Cardiovascular:     Rate and  Rhythm: Normal rate and regular rhythm.     Heart sounds: Normal heart sounds, S1 normal and S2 normal. Heart sounds not distant. No murmur heard.    Comments: EKG- NSR, 70 bpm No LVH, Q waves, or ST/T waves  Pulmonary:     Effort: Pulmonary effort is normal.     Breath sounds: No stridor. No wheezing, rhonchi or rales.  Abdominal:     General: Abdomen is flat.     Palpations: There is no mass.     Tenderness: There is no abdominal tenderness. There is no guarding.     Hernia: No hernia is present.   Musculoskeletal:     Cervical back: Neck supple.     Right lower leg: No edema.     Left lower leg: No edema.  Lymphadenopathy:     Cervical: No cervical adenopathy.  Skin:    General: Skin is warm and dry.     Coloration: Skin is pale.     Findings: No rash.  Neurological:     General: No focal deficit present.     Mental Status: He is alert. Mental status is at baseline.  Psychiatric:        Mood and Affect: Mood normal.        Behavior: Behavior normal.     Lab Results  Component Value Date   WBC 4.8 07/12/2023   HGB 12.7 (L) 07/12/2023   HCT 37.6 (L) 07/12/2023   PLT 186.0 07/12/2023   GLUCOSE 99 07/12/2023   CHOL 87 07/12/2023   TRIG 42.0 07/12/2023   HDL 39.60 07/12/2023   LDLCALC 39 07/12/2023   ALT 25 07/12/2023   AST 19 07/12/2023   NA 141 07/12/2023   K 4.8 07/12/2023   CL 106 07/12/2023   CREATININE 0.97 07/12/2023   BUN 17 07/12/2023   CO2 30 07/12/2023   TSH 3.17 07/02/2022   PSA 1.19 07/12/2023   INR 0.99 08/04/2018   HGBA1C 5.1 07/12/2023   MICROALBUR 3.3 (H) 01/04/2023    NM PET (NETSPOT GA 68 DOTATATE) SKULL BASE TO MID THIGH  Result Date: 08/30/2018 CLINICAL DATA:  Well differentiated neuroendocrine tumor. EXAM: NUCLEAR MEDICINE PET SKULL BASE TO THIGH TECHNIQUE: mCi Ga 38 DOTATATE was injected intravenously. Full-ring PET imaging was performed from the skull base to thigh after the radiotracer. CT data was obtained and used for attenuation correction and anatomic localization. COMPARISON:  None. FINDINGS: NECK No radiotracer activity in neck lymph nodes. Incidental CT findings: None CHEST No radiotracer accumulation within mediastinal or hilar lymph nodes. No suspicious pulmonary nodules on the CT scan. Incidental CT finding:4 mm RIGHT lower lobe nodule on image 88/4. No associated radiotracer activity. Posterior RIGHT lower lobe nodule measuring 4 mm on image 87/4 also without metabolic activity. Radiotracer activity ABDOMEN/PELVIS Intense  radiotracer activity associated with a small mesenteric/small-bowel lesion in the RIGHT abdomen. Lesion is difficult to identify on the noncontrast CT but does localize to a 14 mm lesion adjacent the small bowel on image 147/4. The radiotracer activity is intense with SUV max equal 15.6. Small adjacent mesenteric nodule measuring 8 mm (image 42/4) also with radiotracer activity (SUV max equal 9.7). A third focus of radiotracer activity associated small bowel in the RIGHT abdomen just anterior to the ascending colon with SUV max equal 12.0. No clear CT lesion identified. Lesion found on image 152 of the fused data set. Hepatic metastasis seen on comparison MRI (07/05/2018) are not as clearly depicted on the DOTATATE PET scan.  There are 2 lesions in the RIGHT hepatic lobe with radiotracer activity above background measuring approximately 1 cm each on image 101 of the fused data set. SUV max equal 14.8 and 13.8 respectively. Background liver activity is 12.3. Physiologic activity noted in the adrenal glands, spleen and kidneys. Incidental CT findings:None SKELETON No focal activity to suggest skeletal metastasis. Incidental CT findings:None IMPRESSION: 1. Small (1.5) metastatic mesentery lesion versus primary small bowel lesion in the RIGHT abdomen with intense radiotracer activity. Findings consistent well differentiated neuroendocrine tumor. 2. Small nodular mesenteric implant adjacent to the above dominant lesion. 3. Small implant along the RIGHT abdomen associated with the small bowel without CT correlation. 4. At least 2 hepatic metastasis accumulate the somatostatin specific radiotracer radiotracer. The degree of radiotracer uptake within liver lesions is less than expected for well differentiated neuroendocrine tumor. This may be in part due to high background liver activity. Electronically Signed   By: Genevive Bi M.D.   On: 08/30/2018 15:58    Assessment & Plan:   Type II diabetes mellitus with  manifestations (HCC)- His blood sugar is well-controlled. -     Hemoglobin A1c; Future -     Basic metabolic panel; Future -     HM Diabetes Foot Exam -     CT CARDIAC SCORING (SELF PAY ONLY); Future  Hyperlipidemia LDL goal <70 - He has achieved his LDL goal.  Will risk stratify with a CCS. -     Lipid panel; Future -     Hepatic function panel; Future -     Lipoprotein A (LPA); Future -     CK; Future -     CT CARDIAC SCORING (SELF PAY ONLY); Future  Gastroesophageal reflux disease without esophagitis -     CBC with Differential/Platelet; Future  Routine general medical examination at a health care facility - Exam completed, labs reviewed, vaccines reviewed, cancer screenings addressed, pt ed material was given.  -     PSA; Future  Essential hypertension- His blood pressure is well-controlled. -     Hepatic function panel; Future -     CBC with Differential/Platelet; Future -     Basic metabolic panel; Future -     EKG 12-Lead  Primary osteoarthritis of both hips -     traMADol HCl ER; Take 2 tablets (200 mg total) by mouth daily as needed for pain.  Dispense: 180 tablet; Refill: 1 -     C-reactive protein; Future  Spinal stenosis, lumbar region, without neurogenic claudication -     traMADol HCl ER; Take 2 tablets (200 mg total) by mouth daily as needed for pain.  Dispense: 180 tablet; Refill: 1  Deficiency anemia- Will evaluate for vitamin deficiencies. -     IBC + Ferritin; Future -     Reticulocytes; Future -     Vitamin B1; Future -     Zinc; Future -     Folate; Future -     Vitamin B12; Future     Follow-up: Return in about 6 months (around 01/10/2024).  Sanda Linger, MD

## 2023-07-14 ENCOUNTER — Encounter: Payer: Self-pay | Admitting: Internal Medicine

## 2023-07-15 LAB — LIPOPROTEIN A (LPA): Lipoprotein (a): <10 nmol/L (ref <75)

## 2023-07-16 ENCOUNTER — Other Ambulatory Visit: Payer: Self-pay | Admitting: Internal Medicine

## 2023-07-16 DIAGNOSIS — E118 Type 2 diabetes mellitus with unspecified complications: Secondary | ICD-10-CM

## 2023-07-19 ENCOUNTER — Other Ambulatory Visit (HOSPITAL_COMMUNITY): Payer: Self-pay

## 2023-07-19 NOTE — Telephone Encounter (Signed)
Patient is aware that his medication has been approved.

## 2023-07-19 NOTE — Telephone Encounter (Signed)
Pharmacy Patient Advocate Encounter  Received notification from [facility] that Prior Authorization for mounjaro has been APPROVED from 07/02/23 to 06/30/24   PA #/Case ID/Reference #: [id number]

## 2023-07-30 ENCOUNTER — Other Ambulatory Visit: Payer: BC Managed Care – PPO

## 2023-07-30 ENCOUNTER — Ambulatory Visit
Admission: RE | Admit: 2023-07-30 | Discharge: 2023-07-30 | Disposition: A | Discharge status: Home / Self Care | Payer: No Typology Code available for payment source | Source: Ambulatory Visit | Attending: Internal Medicine | Admitting: Internal Medicine

## 2023-07-30 DIAGNOSIS — E118 Type 2 diabetes mellitus with unspecified complications: Secondary | ICD-10-CM

## 2023-07-30 DIAGNOSIS — E785 Hyperlipidemia, unspecified: Secondary | ICD-10-CM

## 2023-08-04 DIAGNOSIS — C7A011 Malignant carcinoid tumor of the jejunum: Secondary | ICD-10-CM | POA: Diagnosis not present

## 2023-09-01 DIAGNOSIS — C7A011 Malignant carcinoid tumor of the jejunum: Secondary | ICD-10-CM | POA: Diagnosis not present

## 2023-09-23 DIAGNOSIS — R0981 Nasal congestion: Secondary | ICD-10-CM | POA: Diagnosis not present

## 2023-09-23 DIAGNOSIS — R070 Pain in throat: Secondary | ICD-10-CM | POA: Diagnosis not present

## 2023-09-23 DIAGNOSIS — R051 Acute cough: Secondary | ICD-10-CM | POA: Diagnosis not present

## 2023-09-29 DIAGNOSIS — C7A011 Malignant carcinoid tumor of the jejunum: Secondary | ICD-10-CM | POA: Diagnosis not present

## 2023-10-02 ENCOUNTER — Other Ambulatory Visit: Payer: Self-pay | Admitting: Internal Medicine

## 2023-10-02 DIAGNOSIS — E785 Hyperlipidemia, unspecified: Secondary | ICD-10-CM

## 2023-10-21 DIAGNOSIS — C787 Secondary malignant neoplasm of liver and intrahepatic bile duct: Secondary | ICD-10-CM | POA: Diagnosis not present

## 2023-10-21 DIAGNOSIS — C7A011 Malignant carcinoid tumor of the jejunum: Secondary | ICD-10-CM | POA: Diagnosis not present

## 2023-10-27 DIAGNOSIS — C7A011 Malignant carcinoid tumor of the jejunum: Secondary | ICD-10-CM | POA: Diagnosis not present

## 2023-11-24 DIAGNOSIS — C7A011 Malignant carcinoid tumor of the jejunum: Secondary | ICD-10-CM | POA: Diagnosis not present

## 2023-12-22 DIAGNOSIS — C7A011 Malignant carcinoid tumor of the jejunum: Secondary | ICD-10-CM | POA: Diagnosis not present

## 2023-12-26 ENCOUNTER — Other Ambulatory Visit: Payer: Self-pay | Admitting: Internal Medicine

## 2023-12-26 DIAGNOSIS — M160 Bilateral primary osteoarthritis of hip: Secondary | ICD-10-CM

## 2023-12-26 DIAGNOSIS — M48061 Spinal stenosis, lumbar region without neurogenic claudication: Secondary | ICD-10-CM

## 2023-12-29 ENCOUNTER — Other Ambulatory Visit (HOSPITAL_COMMUNITY): Payer: Self-pay

## 2023-12-29 ENCOUNTER — Telehealth: Payer: Self-pay

## 2023-12-29 NOTE — Telephone Encounter (Signed)
 Pharmacy Patient Advocate Encounter  Received notification from [facility] that Prior Authorization for TRAMADOL HCL ER 100MG  has been APPROVED from 12/29/2023 to 12/28/2024. Ran test claim, Copay is $20.00/30 TABS ( MAXIMUN DAILY DOSE OF 1 TABLET). This test claim was processed through [employer]- copay amounts may vary at other pharmacies due to pharmacy/plan contracts, or as the patient moves through the different stages of their insurance plan.   PA #/Case ID/Reference #: [id number]

## 2023-12-29 NOTE — Telephone Encounter (Signed)
 Patient has been made aware.

## 2023-12-29 NOTE — Telephone Encounter (Signed)
 Pharmacy Patient Advocate Encounter   Received notification from CoverMyMeds that prior authorization for traMADol HCl ER 100MG  er tablets is required/requested.   Insurance verification completed.   The patient is insured through [facility] .   Per test claim: PA required; PA submitted to above mentioned insurance via CoverMyMeds Key/confirmation #/EOC [id number] Status is pending

## 2023-12-31 ENCOUNTER — Other Ambulatory Visit: Payer: Self-pay | Admitting: Internal Medicine

## 2023-12-31 DIAGNOSIS — E785 Hyperlipidemia, unspecified: Secondary | ICD-10-CM

## 2024-01-10 ENCOUNTER — Ambulatory Visit (INDEPENDENT_AMBULATORY_CARE_PROVIDER_SITE_OTHER)

## 2024-01-10 ENCOUNTER — Encounter: Payer: Self-pay | Admitting: Internal Medicine

## 2024-01-10 ENCOUNTER — Ambulatory Visit: Payer: BC Managed Care – PPO | Admitting: Internal Medicine

## 2024-01-10 VITALS — BP 120/86 | HR 84 | Temp 98.8°F | Ht 71.0 in | Wt 184.6 lb

## 2024-01-10 DIAGNOSIS — M48061 Spinal stenosis, lumbar region without neurogenic claudication: Secondary | ICD-10-CM

## 2024-01-10 DIAGNOSIS — I10 Essential (primary) hypertension: Secondary | ICD-10-CM

## 2024-01-10 DIAGNOSIS — G8929 Other chronic pain: Secondary | ICD-10-CM | POA: Diagnosis not present

## 2024-01-10 DIAGNOSIS — E785 Hyperlipidemia, unspecified: Secondary | ICD-10-CM

## 2024-01-10 DIAGNOSIS — D539 Nutritional anemia, unspecified: Secondary | ICD-10-CM

## 2024-01-10 DIAGNOSIS — Z7985 Long-term (current) use of injectable non-insulin antidiabetic drugs: Secondary | ICD-10-CM | POA: Diagnosis not present

## 2024-01-10 DIAGNOSIS — E118 Type 2 diabetes mellitus with unspecified complications: Secondary | ICD-10-CM | POA: Diagnosis not present

## 2024-01-10 DIAGNOSIS — M160 Bilateral primary osteoarthritis of hip: Secondary | ICD-10-CM

## 2024-01-10 DIAGNOSIS — M545 Low back pain, unspecified: Secondary | ICD-10-CM | POA: Diagnosis not present

## 2024-01-10 LAB — HEMOGLOBIN A1C: Hgb A1c MFr Bld: 5.2 % (ref 4.6–6.5)

## 2024-01-10 LAB — HEPATIC FUNCTION PANEL
ALT: 20 U/L (ref 0–53)
AST: 16 U/L (ref 0–37)
Albumin: 4.3 g/dL (ref 3.5–5.2)
Alkaline Phosphatase: 43 U/L (ref 39–117)
Bilirubin, Direct: 0.1 mg/dL (ref 0.0–0.3)
Total Bilirubin: 0.5 mg/dL (ref 0.2–1.2)
Total Protein: 6.8 g/dL (ref 6.0–8.3)

## 2024-01-10 LAB — BASIC METABOLIC PANEL WITH GFR
BUN: 16 mg/dL (ref 6–23)
CO2: 28 meq/L (ref 19–32)
Calcium: 9.3 mg/dL (ref 8.4–10.5)
Chloride: 105 meq/L (ref 96–112)
Creatinine, Ser: 0.95 mg/dL (ref 0.40–1.50)
GFR: 86.47 mL/min (ref >60.00)
Glucose, Bld: 109 mg/dL — ABNORMAL HIGH (ref 70–99)
Potassium: 4.8 meq/L (ref 3.5–5.1)
Sodium: 140 meq/L (ref 135–145)

## 2024-01-10 LAB — CBC WITH DIFFERENTIAL/PLATELET
Basophils Absolute: 0 10*3/uL (ref 0.0–0.1)
Basophils Relative: 0.7 % (ref 0.0–3.0)
Eosinophils Absolute: 0.1 10*3/uL (ref 0.0–0.7)
Eosinophils Relative: 1.5 % (ref 0.0–5.0)
HCT: 38.1 % — ABNORMAL LOW (ref 39.0–52.0)
Hemoglobin: 13 g/dL (ref 13.0–17.0)
Lymphocytes Relative: 23.8 % (ref 12.0–46.0)
Lymphs Abs: 1.2 10*3/uL (ref 0.7–4.0)
MCHC: 34.3 g/dL (ref 30.0–36.0)
MCV: 91.3 fl (ref 78.0–100.0)
Monocytes Absolute: 0.4 10*3/uL (ref 0.1–1.0)
Monocytes Relative: 8.1 % (ref 3.0–12.0)
Neutro Abs: 3.2 10*3/uL (ref 1.4–7.7)
Neutrophils Relative %: 65.9 % (ref 43.0–77.0)
Platelets: 209 10*3/uL (ref 150.0–400.0)
RBC: 4.17 Mil/uL — ABNORMAL LOW (ref 4.22–5.81)
RDW: 13.9 % (ref 11.5–15.5)
WBC: 4.9 10*3/uL (ref 4.0–10.5)

## 2024-01-10 LAB — IBC + FERRITIN
Ferritin: 39 ng/mL (ref 22.0–322.0)
Iron: 75 ug/dL (ref 42–165)
Saturation Ratios: 24.4 % (ref 20.0–50.0)
TIBC: 308 ug/dL (ref 250.0–450.0)
Transferrin: 220 mg/dL (ref 212.0–360.0)

## 2024-01-10 LAB — URINALYSIS, ROUTINE W REFLEX MICROSCOPIC
Hgb urine dipstick: NEGATIVE
Ketones, ur: NEGATIVE
Leukocytes,Ua: NEGATIVE
Nitrite: NEGATIVE
RBC / HPF: NONE SEEN (ref 0–?)
Specific Gravity, Urine: >=1.03 — AB (ref 1.000–1.030)
Total Protein, Urine: NEGATIVE
Urine Glucose: NEGATIVE
Urobilinogen, UA: 1 (ref 0.0–1.0)
pH: 6 (ref 5.0–8.0)

## 2024-01-10 LAB — MICROALBUMIN / CREATININE URINE RATIO
Creatinine,U: 182.4 mg/dL
Microalb Creat Ratio: 6.2 mg/g (ref 0.0–30.0)
Microalb, Ur: 1.1 mg/dL (ref 0.0–1.9)

## 2024-01-10 MED ORDER — ROSUVASTATIN CALCIUM 5 MG PO TABS: 5.0000 mg | ORAL_TABLET | Freq: Every day | ORAL | 0 refills | Status: DC

## 2024-01-10 MED ORDER — DICLOFENAC SODIUM 75 MG PO TBEC: 75.0000 mg | DELAYED_RELEASE_TABLET | Freq: Two times a day (BID) | ORAL | 0 refills | Status: DC

## 2024-01-10 MED ORDER — TRAMADOL HCL ER 100 MG PO TB24: 200.0000 mg | ORAL_TABLET | Freq: Every day | ORAL | 0 refills | Status: DC | PRN

## 2024-01-10 MED ORDER — MOUNJARO 7.5 MG/0.5ML ~~LOC~~ SOAJ: 7.5000 mg | SUBCUTANEOUS | 0 refills | Status: DC

## 2024-01-10 NOTE — Patient Instructions (Signed)
Managing Chronic Back Pain Chronic back pain is pain that lasts longer than 3 months. It often affects the lower back. It may feel like a muscle ache or a sharp, stabbing pain. It can be mild, moderate, or severe. There are things you can do to help manage your pain. See what works best for you. Your health care provider may also give you other instructions. What actions can I take to manage my chronic back pain? You may be given a treatment plan by your provider. Treatment often starts with rest and pain relief. It may also include: Physical therapy. These are exercises to help restore movement and strength to your back. Techniques to help you relax. Counseling or therapy. Cognitive behavioral therapy (CBT) is a form of therapy that helps you set goals and make changes. Acupuncture or massage therapy. Local electrical stimulation. Injections. You may be given medicines to numb an area or relieve pain. If other treatments do not help, you may need surgery. How to use body mechanics and posture to help with pain You can help relieve stress on your back with good posture and healthy body mechanics. Body mechanics are all the ways your body moves during the day. Posture is part of body mechanics. Good posture means: Your spine is in its correct S-curve, or neutral, position. Your shoulders are pulled back a bit. Your head is not tipped forward. To improve your posture and body mechanics, follow these guidelines. Standing  When standing, keep your feet about hip-width apart. Keep your knees slightly bent. Your ears, shoulders, and hips should line up. Your spine should be neutral. When you stand in one place for a long time, place one foot on a stable object that is 2-4 inches (5-10 cm) high, such as a footstool. Sitting  When sitting, keep your feet flat on the floor. Use a footrest, if needed. Keep your thighs parallel to the floor. Try not to round your shoulders or tilt your head  forward. When working at a desk or a computer: Position your desk so your hands are a little lower than your elbows. Slide your chair under your desk so you are close enough to have good posture. Position your monitor so you are looking straight ahead and do not have to tilt your head to view the screen. Lifting  Keep your feet shoulder-width apart. Tighten the muscles of your abdomen. Bend your knees and hips. Keep your spine neutral. Lift using the strength of your legs, not your back. Do not lock your knees straight out. Ask for help to lift heavy or awkward objects. Resting  Do not lie down in a way that causes pain. If you have pain when you sit, bend, stoop, or squat, lie in a way that your body does not bend much. Try not to curl up on your side with your arms and knees near your chest (fetal position). If it hurts to stand for a long time or reach with your arms, lie with your spine neutral and knees bent slightly. Try lying: On your side with a pillow between your knees. On your back with a pillow under your knees. How to recognize changes in your chronic back pain Let your provider know if your pain gets worse or does not get better with treatment. Your back pain may be getting worse if you have pain that: Starts to cause problems with your posture. Gets worse when you sit, stand, walk, bend, or lift things. Happens when you are active,  at rest, or both. Makes it hard for you to move around (limits mobility). Occurs with fever, weight loss, or trouble peeing (urinating). Causes numbness and tingling. Follow these instructions at home: Medicines You may need to take medicines for pain and inflammation. These may be taken by mouth or put on the skin. You may also be given muscle relaxants. Take over-the-counter and prescription medicines only as told by your provider. Ask your provider if the medicine prescribed to you: Requires you to avoid driving or using machinery. Can  cause constipation. You may need to take these actions to prevent or treat constipation: Drink enough fluid to keep your pee (urine) pale yellow. Take over-the-counter or prescription medicines. Eat foods that are high in fiber, such as beans, whole grains, and fresh fruits and vegetables. Limit foods that are high in fat and processed sugars, such as fried or sweet foods. Lifestyle Do not use any products that contain nicotine or tobacco. These products include cigarettes, chewing tobacco, and vaping devices, such as e-cigarettes. If you need help quitting, ask your provider. Eat a healthy diet. Eat lots of vegetables, fruits, fish, and lean meats. Work with your provider to stay at a healthy weight. General instructions Get regular exercise as told. Exercise can help with flexibility and strength. If physical therapy was prescribed, do exercises as told by your provider. Use ice or heat therapy as told by your provider. Where can I get support? Think about joining a support group for people with chronic back pain. You can find some groups at: Pain Connection Program: painconnection.org The American Chronic Pain Association: acpanow.com Contact a health care provider if: Your pain does not get better with rest or medicine. You have new pain. You have a fever. You lose weight quickly. You have trouble doing your normal activities. You feel weak or numb in one or both of your legs or feet. Get help right away if: You are not able to control when you pee or poop. You have severe back pain and: Nausea or vomiting. Pain in your chest or abdomen. Shortness of breath. You faint. These symptoms may be an emergency. Get help right away. Call 911. Do not wait to see if the symptoms will go away. Do not drive yourself to the hospital. This information is not intended to replace advice given to you by your health care provider. Make sure you discuss any questions you have with your health care  provider. Document Revised: 04/20/2022 Document Reviewed: 04/20/2022 Elsevier Patient Education  2024 [employer].

## 2024-01-10 NOTE — Progress Notes (Signed)
 Subjective:  Patient ID: Henry Lang, male    DOB: [birth year]  Age: 63 y.o. MRN: [id number]  CC: Hypertension, Anemia, Hyperlipidemia, and Diabetes   HPI Henry Lang presents for f/up ----  Discussed the use of AI scribe software for clinical note transcription with the patient, who gave verbal consent to proceed.  History of Present Illness   Henry Lang is a 63 year old male who presents with worsening right buttock pain, suspected to be sciatic nerve pain.  He has been experiencing pain in the right buttock area for approximately two years, which he suspects is related to the sciatic nerve. The pain has gradually worsened over time and was initially triggered by long periods of driving. It has now become more persistent. The pain does not radiate down his leg and does not cause numbness, weakness, or tingling in his legs or feet. It is primarily localized to the right buttock and is exacerbated by driving for extended periods, such as an hour or more. A recent series of long drives significantly increased his pain, almost prompting a visit to the emergency room.  He manages the pain with tramadol  and diclofenac . Tramadol  is taken twice daily and provides significant relief, but he recently ran out of his prescription two weeks prior to the visit, which has negatively impacted his pain management. He also uses a deep massage gun for relief, which helps alleviate the pain temporarily.  No chest pain or shortness of breath during physical activity. He reports stable bowel movements without constipation despite tramadol  use. His weight is stable at 180 pounds, with a slight fluctuation from 190 pounds last October, which he attributes to the use of Mounjaro .       Outpatient Medications Prior to Visit  Medication Sig Dispense Refill   lanreotide acetate (SOMATULINE DEPOT) 120 MG/0.5ML injection Inject 120 mg into the skin every 28 (twenty-eight) days. 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 180 tablet 0   No facility-administered medications prior to visit.    ROS Review of Systems  Constitutional: Negative.  Negative for appetite change, chills, diaphoresis, fatigue and fever.  HENT: Negative.    Eyes: Negative.   Respiratory: Negative.  Negative for cough, chest tightness, shortness of breath and wheezing.   Cardiovascular:  Negative for chest pain, palpitations and leg swelling.  Gastrointestinal: Negative.  Negative for abdominal pain, constipation, diarrhea, nausea and vomiting.  Genitourinary: Negative.  Negative for difficulty urinating.  Musculoskeletal:  Positive for back pain. Negative for arthralgias, joint swelling and myalgias.  Skin: Negative.   Neurological:  Negative for dizziness and weakness.  Hematological:  Negative for adenopathy. Does not bruise/bleed easily.  Psychiatric/Behavioral: Negative.      Objective:  BP 120/86 (BP Location: Left Arm, Patient Position: Sitting, Cuff Size: Normal)   Pulse 84   Temp 98.8 F (37.1 C) (Temporal)   Ht 5\' 11"  (1.803 m)   Wt 184 lb 9.6 oz (83.7 kg)   SpO2 98%   BMI  25.75 kg/m   BP Readings from Last 3 Encounters:  01/10/24 120/86  07/12/23 122/78  01/04/23 116/60    Wt Readings from Last 3 Encounters:  01/10/24 184 lb 9.6 oz (83.7 kg)  07/12/23 189 lb 9.6 oz (86 kg)  01/04/23 196 lb (88.9 kg)    Physical Exam Vitals reviewed.  Constitutional:      Appearance: Normal appearance.  HENT:     Nose: Nose normal.     Mouth/Throat:     Mouth: Mucous membranes are moist.  Eyes:      General: No scleral icterus.    Conjunctiva/sclera: Conjunctivae normal.  Cardiovascular:     Rate and Rhythm: Normal rate and regular rhythm.     Heart sounds: No murmur heard.    No friction rub. No gallop.  Pulmonary:     Effort: Pulmonary effort is normal.     Breath sounds: No stridor. No wheezing, rhonchi or rales.  Abdominal:     General: Abdomen is flat.     Palpations: There is no mass.     Tenderness: There is no abdominal tenderness. There is no guarding.     Hernia: No hernia is present.  Musculoskeletal:        General: No swelling, tenderness or deformity. Normal range of motion.     Cervical back: Neck supple.     Lumbar back: No bony tenderness. Normal range of motion. Negative right straight leg raise test and negative left straight leg raise test.     Right lower leg: No edema.     Left lower leg: No edema.  Lymphadenopathy:     Cervical: No cervical adenopathy.  Skin:    General: Skin is warm and dry.  Neurological:     General: No focal deficit present.     Mental Status: He is alert. Mental status is at baseline.     Deep Tendon Reflexes: Reflexes normal.     Reflex Scores:      Tricep reflexes are 1+ on the right side and 1+ on the left side.      Bicep reflexes are 1+ on the right side and 1+ on the left side.      Brachioradialis reflexes are 1+ on the right side and 1+ on the left side.      Patellar reflexes are 1+ on the right side and 1+ on the left side.      Achilles reflexes are 1+ on the right side and 1+ on the left side.    Lab Results  Component Value Date   WBC 4.9 01/10/2024   HGB 13.0 01/10/2024   HCT 38.1 (L) 01/10/2024   PLT 209.0 01/10/2024   GLUCOSE 109 (H) 01/10/2024   CHOL 87 07/12/2023   TRIG 42.0 07/12/2023   HDL 39.60 07/12/2023   LDLCALC 39 07/12/2023   ALT 20 01/10/2024   AST 16 01/10/2024   NA 140 01/10/2024   K 4.8 01/10/2024   CL 105 01/10/2024   CREATININE 0.95 01/10/2024   BUN 16 01/10/2024   CO2 28  01/10/2024   TSH 3.17 07/02/2022   PSA 1.19 07/12/2023   INR 0.99 08/04/2018   HGBA1C 5.2 01/10/2024   MICROALBUR 1.1 01/10/2024    CT CARDIAC SCORING ([facility] LOCATIONS ONLY) Result Date: 07/30/2023 CLINICAL DATA:  63 year old Caucasian male with history of hyperlipidemia. * Tracking Code: FCC * EXAM: CT CARDIAC CORONARY ARTERY CALCIUM  SCORE TECHNIQUE: Non-contrast imaging through the heart was performed using prospective ECG gating. Image  post processing was performed on an independent workstation, allowing for quantitative analysis of the heart and coronary arteries. Note that this exam targets the heart and the chest was not imaged in its entirety. COMPARISON:  No priors. FINDINGS: CORONARY CALCIUM  SCORES: Left Main: 0 LAD: 0 LCx: 0 RCA/PDA: 1 Total Agatston Score: 1 MESA database percentile: 31st AORTA MEASUREMENTS: Ascending Aorta: 3.8 cm Descending Aorta:2.8 cm OTHER FINDINGS: 5 mm right lower lobe pulmonary nodule (axial image 59). Within the visualized portions of the thorax there are no larger more suspicious appearing pulmonary nodules or masses, there is no acute consolidative airspace disease, no pleural effusions, no pneumothorax and no lymphadenopathy. Visualized portions of the upper abdomen demonstrate a 2.4 cm low-attenuation lesion in segment 2 of the liver, incompletely characterized on today's noncontrast CT examination, but statistically likely a cyst (no imaging follow-up recommended). There are no aggressive appearing lytic or blastic lesions noted in the visualized portions of the skeleton. IMPRESSION: 1. Patient's total coronary artery calcium  score is 1 which is 31st percentile for patient's of matched age, gender and race/ethnicity. Please note that although the presence of coronary artery calcium  documents the presence of coronary artery disease, the severity of this disease and any potential stenosis cannot be assessed on this noncontrast CT examination. Assessment for potential  risk factor modification, dietary therapy or pharmacologic therapy may be warranted, if clinically indicated. 2. 5 mm right lower lobe pulmonary nodule, nonspecific, but statistically likely benign. No follow-up needed if patient is low-risk.This recommendation follows the consensus statement: Guidelines for Management of Incidental Pulmonary Nodules Detected on CT Images: From the Fleischner Society 2017; Radiology 2017; 284:228-243. Electronically Signed   By: Alexandria Angel M.D.   On: 07/30/2023 09:03    No results found.    Assessment & Plan:  Spinal stenosis, lumbar region, without neurogenic claudication- Will restart tramadol . -     Diclofenac  Sodium; Take 1 tablet (75 mg total) by mouth 2 (two) times daily.  Dispense: 180 tablet; Refill: 0 -     traMADol  HCl ER; Take 2 tablets (200 mg total) by mouth daily as needed for pain.  Dispense: 180 tablet; Refill: 0 -     DG Lumbar Spine Complete; Future  Essential hypertension- His BP is well controlled. -     Basic metabolic panel with GFR; Future -     CBC with Differential/Platelet; Future -     Urinalysis, Routine w reflex microscopic; Future  Hyperlipidemia LDL goal <70- LDL goal achieved. Doing well on the statin  -     Hepatic function panel; Future -     Rosuvastatin  Calcium ; Take 1 tablet (5 mg total) by mouth daily.  Dispense: 90 tablet; Refill: 0  Type II diabetes mellitus with manifestations (HCC)- Blood sugar is well controlled. -     Basic metabolic panel with GFR; Future -     Urinalysis, Routine w reflex microscopic; Future -     Hemoglobin A1c; Future -     Microalbumin / creatinine urine ratio; Future -     Mounjaro ; Inject 7.5 mg into the skin once a week.  Dispense: 12 mL; Refill: 0  Primary osteoarthritis of both hips -     traMADol  HCl ER; Take 2 tablets (200 mg total) by mouth daily as needed for pain.  Dispense: 180 tablet; Refill: 0  Deficiency anemia-The anemia has improved. -     Zinc -     Vitamin B1 -      Reticulocytes -  IBC + Ferritin     Follow-up: Return in about 6 months (around 07/11/2024).  Sandra Crouch, MD

## 2024-01-13 ENCOUNTER — Encounter: Payer: Self-pay | Admitting: Internal Medicine

## 2024-01-13 NOTE — Telephone Encounter (Signed)
 Pt has dropped off forms from his [employer] company to be filled out for his Tramadol  RX and they have been placed in the pcp's box.   Upon completion please fax to: [phone]

## 2024-01-14 LAB — VITAMIN B1: Vitamin B1 (Thiamine): 8 nmol/L (ref 8–30)

## 2024-01-14 LAB — RETICULOCYTES
ABS Retic: 74700 {cells}/uL (ref 25000–90000)
Retic Ct Pct: 1.8 %

## 2024-01-14 LAB — ZINC: Zinc: 60 ug/dL (ref 60–130)

## 2024-01-19 DIAGNOSIS — C7A011 Malignant carcinoid tumor of the jejunum: Secondary | ICD-10-CM | POA: Diagnosis not present

## 2024-01-21 DIAGNOSIS — M533 Sacrococcygeal disorders, not elsewhere classified: Secondary | ICD-10-CM | POA: Diagnosis not present

## 2024-01-21 DIAGNOSIS — M5416 Radiculopathy, lumbar region: Secondary | ICD-10-CM | POA: Diagnosis not present

## 2024-01-25 ENCOUNTER — Other Ambulatory Visit (HOSPITAL_COMMUNITY): Payer: Self-pay

## 2024-01-27 ENCOUNTER — Other Ambulatory Visit: Payer: Self-pay | Admitting: Orthopedic Surgery

## 2024-01-27 DIAGNOSIS — M533 Sacrococcygeal disorders, not elsewhere classified: Secondary | ICD-10-CM

## 2024-02-01 ENCOUNTER — Other Ambulatory Visit: Payer: Self-pay | Admitting: Family

## 2024-02-01 DIAGNOSIS — M545 Low back pain, unspecified: Secondary | ICD-10-CM | POA: Diagnosis not present

## 2024-02-01 MED ORDER — TRAMADOL HCL ER 200 MG PO TB24: 200.0000 mg | ORAL_TABLET | Freq: Every day | ORAL | 0 refills | Status: DC

## 2024-02-11 DIAGNOSIS — M545 Low back pain, unspecified: Secondary | ICD-10-CM | POA: Diagnosis not present

## 2024-02-16 ENCOUNTER — Ambulatory Visit
Admission: RE | Admit: 2024-02-16 | Discharge: 2024-02-16 | Disposition: A | Discharge status: Home / Self Care | Source: Ambulatory Visit | Attending: Orthopedic Surgery | Admitting: Orthopedic Surgery

## 2024-02-16 DIAGNOSIS — M533 Sacrococcygeal disorders, not elsewhere classified: Secondary | ICD-10-CM | POA: Diagnosis not present

## 2024-02-21 ENCOUNTER — Encounter: Payer: Self-pay | Admitting: Internal Medicine

## 2024-02-22 ENCOUNTER — Other Ambulatory Visit: Payer: Self-pay | Admitting: Internal Medicine

## 2024-02-22 DIAGNOSIS — E118 Type 2 diabetes mellitus with unspecified complications: Secondary | ICD-10-CM

## 2024-02-22 MED ORDER — MOUNJARO 7.5 MG/0.5ML ~~LOC~~ SOAJ: 7.5000 mg | SUBCUTANEOUS | 0 refills | Status: DC

## 2024-02-22 NOTE — Telephone Encounter (Signed)
 Copied from CRM [id number]. Topic: Clinical - Medication Refill >> Feb 22, 2024  3:32 PM Marlan Silva wrote: Medication:  tirzepatide  (MOUNJARO ) 7.5 MG/0.5ML Pen   Has the patient contacted their pharmacy? Yes (Agent: If no, request that the patient contact the pharmacy for the refill. If patient does not wish to contact the pharmacy document the reason why and proceed with request.) (Agent: If yes, when and what did the pharmacy advise?)  This is the patient's preferred pharmacy:  [facility] ST[facility] #[id number] Jonette Nestle, [place] - [address] AT [facility] [address] [place] [address] Phone: [phone] Fax: [phone]  Is this the correct pharmacy for this prescription? Yes If no, delete pharmacy and type the correct one.   Has the prescription been filled recently? Yes  Is the patient out of the medication? Yes  Has the patient been seen for an appointment in the last year OR does the patient have an upcoming appointment? Yes  Can we respond through MyChart? Yes  Agent: Please be advised that Rx refills may take up to 3 business days. We ask that you follow-up with your pharmacy.

## 2024-02-28 DIAGNOSIS — M48062 Spinal stenosis, lumbar region with neurogenic claudication: Secondary | ICD-10-CM | POA: Diagnosis not present

## 2024-03-06 ENCOUNTER — Other Ambulatory Visit: Payer: Self-pay | Admitting: Family

## 2024-03-14 DIAGNOSIS — M48062 Spinal stenosis, lumbar region with neurogenic claudication: Secondary | ICD-10-CM | POA: Diagnosis not present

## 2024-03-15 ENCOUNTER — Encounter: Payer: Self-pay | Admitting: Internal Medicine

## 2024-03-15 DIAGNOSIS — C7A011 Malignant carcinoid tumor of the jejunum: Secondary | ICD-10-CM | POA: Diagnosis not present

## 2024-03-17 ENCOUNTER — Other Ambulatory Visit: Payer: Self-pay | Admitting: Internal Medicine

## 2024-03-17 DIAGNOSIS — M48061 Spinal stenosis, lumbar region without neurogenic claudication: Secondary | ICD-10-CM

## 2024-03-17 DIAGNOSIS — M160 Bilateral primary osteoarthritis of hip: Secondary | ICD-10-CM

## 2024-03-17 MED ORDER — TRAMADOL HCL ER 200 MG PO TB24: 200.0000 mg | ORAL_TABLET | Freq: Every day | ORAL | 0 refills | Status: DC

## 2024-03-22 ENCOUNTER — Other Ambulatory Visit: Payer: Self-pay | Admitting: Internal Medicine

## 2024-03-22 DIAGNOSIS — E118 Type 2 diabetes mellitus with unspecified complications: Secondary | ICD-10-CM

## 2024-03-23 DIAGNOSIS — H524 Presbyopia: Secondary | ICD-10-CM | POA: Diagnosis not present

## 2024-03-23 DIAGNOSIS — H52223 Regular astigmatism, bilateral: Secondary | ICD-10-CM | POA: Diagnosis not present

## 2024-03-23 DIAGNOSIS — E119 Type 2 diabetes mellitus without complications: Secondary | ICD-10-CM | POA: Diagnosis not present

## 2024-03-23 LAB — HM DIABETES EYE EXAM

## 2024-03-27 ENCOUNTER — Encounter: Payer: Self-pay | Admitting: Internal Medicine

## 2024-03-28 ENCOUNTER — Other Ambulatory Visit: Payer: Self-pay | Admitting: Internal Medicine

## 2024-03-28 ENCOUNTER — Other Ambulatory Visit: Payer: Self-pay

## 2024-03-28 DIAGNOSIS — E785 Hyperlipidemia, unspecified: Secondary | ICD-10-CM

## 2024-03-30 ENCOUNTER — Other Ambulatory Visit: Payer: Self-pay | Admitting: Internal Medicine

## 2024-03-30 DIAGNOSIS — E118 Type 2 diabetes mellitus with unspecified complications: Secondary | ICD-10-CM

## 2024-03-31 ENCOUNTER — Other Ambulatory Visit: Payer: Self-pay

## 2024-03-31 DIAGNOSIS — M48062 Spinal stenosis, lumbar region with neurogenic claudication: Secondary | ICD-10-CM | POA: Diagnosis not present

## 2024-03-31 DIAGNOSIS — E118 Type 2 diabetes mellitus with unspecified complications: Secondary | ICD-10-CM

## 2024-03-31 MED ORDER — MOUNJARO 7.5 MG/0.5ML ~~LOC~~ SOAJ
7.5000 mg | SUBCUTANEOUS | 0 refills | Status: DC
Start: 2024-03-31 — End: 2024-07-18

## 2024-04-06 DIAGNOSIS — R3915 Urgency of urination: Secondary | ICD-10-CM | POA: Diagnosis not present

## 2024-04-06 DIAGNOSIS — Z9049 Acquired absence of other specified parts of digestive tract: Secondary | ICD-10-CM | POA: Diagnosis not present

## 2024-04-06 DIAGNOSIS — C7B02 Secondary carcinoid tumors of liver: Secondary | ICD-10-CM | POA: Diagnosis not present

## 2024-04-06 DIAGNOSIS — N1339 Other hydronephrosis: Secondary | ICD-10-CM | POA: Diagnosis not present

## 2024-04-06 DIAGNOSIS — N200 Calculus of kidney: Secondary | ICD-10-CM | POA: Diagnosis not present

## 2024-04-06 DIAGNOSIS — Z87440 Personal history of urinary (tract) infections: Secondary | ICD-10-CM | POA: Diagnosis not present

## 2024-04-06 DIAGNOSIS — R1032 Left lower quadrant pain: Secondary | ICD-10-CM | POA: Diagnosis not present

## 2024-04-06 DIAGNOSIS — N132 Hydronephrosis with renal and ureteral calculous obstruction: Secondary | ICD-10-CM | POA: Diagnosis not present

## 2024-04-06 DIAGNOSIS — M545 Low back pain, unspecified: Secondary | ICD-10-CM | POA: Diagnosis not present

## 2024-04-06 DIAGNOSIS — C7A019 Malignant carcinoid tumor of the small intestine, unspecified portion: Secondary | ICD-10-CM | POA: Diagnosis not present

## 2024-04-10 DIAGNOSIS — M25551 Pain in right hip: Secondary | ICD-10-CM | POA: Diagnosis not present

## 2024-04-12 DIAGNOSIS — C7A011 Malignant carcinoid tumor of the jejunum: Secondary | ICD-10-CM | POA: Diagnosis not present

## 2024-04-14 ENCOUNTER — Other Ambulatory Visit: Payer: Self-pay | Admitting: Internal Medicine

## 2024-04-14 DIAGNOSIS — M25551 Pain in right hip: Secondary | ICD-10-CM | POA: Diagnosis not present

## 2024-04-14 DIAGNOSIS — M48061 Spinal stenosis, lumbar region without neurogenic claudication: Secondary | ICD-10-CM

## 2024-04-18 DIAGNOSIS — R531 Weakness: Secondary | ICD-10-CM | POA: Diagnosis not present

## 2024-04-18 DIAGNOSIS — M25651 Stiffness of right hip, not elsewhere classified: Secondary | ICD-10-CM | POA: Diagnosis not present

## 2024-04-18 DIAGNOSIS — M7601 Gluteal tendinitis, right hip: Secondary | ICD-10-CM | POA: Diagnosis not present

## 2024-04-18 DIAGNOSIS — M1611 Unilateral primary osteoarthritis, right hip: Secondary | ICD-10-CM | POA: Diagnosis not present

## 2024-04-18 NOTE — Telephone Encounter (Signed)
 Last OV 01/10/24 Next OV 07/11/24  Last refill 01/10/24 Qty #180/0   Renal labs 04/06/24  Component Ref Range & Units 12 d ago  Sodium 136 - 145 mmol/L 141  Potassium 3.5 - 5.1 mmol/L 4.3  Comment: NO VISIBLE HEMOLYSIS  Chloride 98 - 107 mmol/L 108 High   CO2 21 - 31 mmol/L 26  Anion Gap 6 - 14 mmol/L 7  Glucose, Random 70 - 99 mg/dL 899 High   Blood Urea  Nitrogen (BUN) 7 - 25 mg/dL 17  Creatinine 9.29 - 8.69 mg/dL 9.00  eGFR >40 fO/fpw/8.26f7 87  Comment: GFR estimated by CKD-EPI equations(NKF 2021).  Recommend confirmation of Cr-based eGFR by using Cys-based eGFR and other filtration markers (if applicable) in complex cases and clinical decision-making, as needed.  Albumin 3.5 - 5.7 g/dL 4.5  Total Protein 6.4 - 8.9 g/dL 6.9  Bilirubin, Total 0.3 - 1.0 mg/dL 0.4  Alkaline Phosphatase (ALP) 34 - 104 U/L 46  Aspartate Aminotransferase (AST) 13 - 39 U/L 20  Alanine Aminotransferase (ALT) 7 - 52 U/L 23  Calcium  8.6 - 10.3 mg/dL 9.5  BUN/Creatinine Ratio   Comment: Creatinine is normal, ratio is not clinically indicated.

## 2024-04-21 DIAGNOSIS — R918 Other nonspecific abnormal finding of lung field: Secondary | ICD-10-CM | POA: Diagnosis not present

## 2024-04-21 DIAGNOSIS — C7A011 Malignant carcinoid tumor of the jejunum: Secondary | ICD-10-CM | POA: Diagnosis not present

## 2024-04-21 DIAGNOSIS — C7B8 Other secondary neuroendocrine tumors: Secondary | ICD-10-CM | POA: Diagnosis not present

## 2024-04-24 DIAGNOSIS — C7A011 Malignant carcinoid tumor of the jejunum: Secondary | ICD-10-CM | POA: Diagnosis not present

## 2024-05-10 DIAGNOSIS — C7A011 Malignant carcinoid tumor of the jejunum: Secondary | ICD-10-CM | POA: Diagnosis not present

## 2024-06-07 DIAGNOSIS — C7A011 Malignant carcinoid tumor of the jejunum: Secondary | ICD-10-CM | POA: Diagnosis not present

## 2024-06-19 ENCOUNTER — Other Ambulatory Visit: Payer: Self-pay | Admitting: Internal Medicine

## 2024-06-19 DIAGNOSIS — M160 Bilateral primary osteoarthritis of hip: Secondary | ICD-10-CM

## 2024-06-19 DIAGNOSIS — M48061 Spinal stenosis, lumbar region without neurogenic claudication: Secondary | ICD-10-CM

## 2024-06-26 ENCOUNTER — Other Ambulatory Visit: Payer: Self-pay | Admitting: Internal Medicine

## 2024-06-26 DIAGNOSIS — E785 Hyperlipidemia, unspecified: Secondary | ICD-10-CM

## 2024-07-04 ENCOUNTER — Other Ambulatory Visit (HOSPITAL_COMMUNITY): Payer: Self-pay

## 2024-07-05 DIAGNOSIS — C7A011 Malignant carcinoid tumor of the jejunum: Secondary | ICD-10-CM | POA: Diagnosis not present

## 2024-07-11 ENCOUNTER — Ambulatory Visit: Admitting: Internal Medicine

## 2024-07-11 ENCOUNTER — Encounter: Payer: Self-pay | Admitting: Internal Medicine

## 2024-07-11 ENCOUNTER — Ambulatory Visit: Payer: Self-pay | Admitting: Internal Medicine

## 2024-07-11 VITALS — BP 124/80 | HR 79 | Temp 98.6°F | Resp 16 | Ht 71.0 in | Wt 188.0 lb

## 2024-07-11 DIAGNOSIS — E785 Hyperlipidemia, unspecified: Secondary | ICD-10-CM

## 2024-07-11 DIAGNOSIS — Z23 Encounter for immunization: Secondary | ICD-10-CM | POA: Diagnosis not present

## 2024-07-11 DIAGNOSIS — Z0000 Encounter for general adult medical examination without abnormal findings: Secondary | ICD-10-CM | POA: Diagnosis not present

## 2024-07-11 DIAGNOSIS — E118 Type 2 diabetes mellitus with unspecified complications: Secondary | ICD-10-CM

## 2024-07-11 DIAGNOSIS — Z125 Encounter for screening for malignant neoplasm of prostate: Secondary | ICD-10-CM

## 2024-07-11 DIAGNOSIS — M48061 Spinal stenosis, lumbar region without neurogenic claudication: Secondary | ICD-10-CM | POA: Diagnosis not present

## 2024-07-11 DIAGNOSIS — I10 Essential (primary) hypertension: Secondary | ICD-10-CM

## 2024-07-11 DIAGNOSIS — M160 Bilateral primary osteoarthritis of hip: Secondary | ICD-10-CM

## 2024-07-11 LAB — CBC WITH DIFFERENTIAL/PLATELET
Basophils Absolute: 0 K/uL (ref 0.0–0.1)
Basophils Relative: 0.5 % (ref 0.0–3.0)
Eosinophils Absolute: 0.1 K/uL (ref 0.0–0.7)
Eosinophils Relative: 2.2 % (ref 0.0–5.0)
HCT: 37.7 % — ABNORMAL LOW (ref 39.0–52.0)
Hemoglobin: 13.2 g/dL (ref 13.0–17.0)
Lymphocytes Relative: 29.7 % (ref 12.0–46.0)
Lymphs Abs: 1.6 K/uL (ref 0.7–4.0)
MCHC: 34.9 g/dL (ref 30.0–36.0)
MCV: 88.6 fl (ref 78.0–100.0)
Monocytes Absolute: 0.4 K/uL (ref 0.1–1.0)
Monocytes Relative: 7.5 % (ref 3.0–12.0)
Neutro Abs: 3.2 K/uL (ref 1.4–7.7)
Neutrophils Relative %: 60.1 % (ref 43.0–77.0)
Platelets: 168 K/uL (ref 150.0–400.0)
RBC: 4.26 Mil/uL (ref 4.22–5.81)
RDW: 13 % (ref 11.5–15.5)
WBC: 5.3 K/uL (ref 4.0–10.5)

## 2024-07-11 LAB — URINALYSIS, ROUTINE W REFLEX MICROSCOPIC
Bilirubin Urine: NEGATIVE
Hgb urine dipstick: NEGATIVE
Ketones, ur: NEGATIVE
Leukocytes,Ua: NEGATIVE
Nitrite: NEGATIVE
Specific Gravity, Urine: 1.02 (ref 1.000–1.030)
Total Protein, Urine: NEGATIVE
Urine Glucose: NEGATIVE
Urobilinogen, UA: 1 (ref 0.0–1.0)
pH: 6.5 (ref 5.0–8.0)

## 2024-07-11 LAB — BASIC METABOLIC PANEL WITH GFR
BUN: 12 mg/dL (ref 6–23)
CO2: 28 meq/L (ref 19–32)
Calcium: 8.9 mg/dL (ref 8.4–10.5)
Chloride: 103 meq/L (ref 96–112)
Creatinine, Ser: 0.89 mg/dL (ref 0.40–1.50)
GFR: 92.25 mL/min (ref >60.00)
Glucose, Bld: 83 mg/dL (ref 70–99)
Potassium: 3.7 meq/L (ref 3.5–5.1)
Sodium: 139 meq/L (ref 135–145)

## 2024-07-11 LAB — HEPATIC FUNCTION PANEL
ALT: 17 U/L (ref 0–53)
AST: 17 U/L (ref 0–37)
Albumin: 4.3 g/dL (ref 3.5–5.2)
Alkaline Phosphatase: 45 U/L (ref 39–117)
Bilirubin, Direct: 0.2 mg/dL (ref 0.0–0.3)
Total Bilirubin: 0.6 mg/dL (ref 0.2–1.2)
Total Protein: 6.4 g/dL (ref 6.0–8.3)

## 2024-07-11 LAB — TSH: TSH: 3.93 u[IU]/mL (ref 0.35–5.50)

## 2024-07-11 LAB — PSA: PSA: 0.52 ng/mL (ref 0.10–4.00)

## 2024-07-11 LAB — HEMOGLOBIN A1C: Hgb A1c MFr Bld: 5 % (ref 4.6–6.5)

## 2024-07-11 MED ORDER — CELECOXIB 200 MG PO CAPS: 200.0000 mg | ORAL_CAPSULE | Freq: Every day | ORAL | 1 refills | Status: AC

## 2024-07-11 NOTE — Patient Instructions (Signed)
 Health Maintenance, Male  Adopting a healthy lifestyle and getting preventive care are important in promoting health and wellness. Ask your health care provider about:  The right schedule for you to have regular tests and exams.  Things you can do on your own to prevent diseases and keep yourself healthy.  What should I know about diet, weight, and exercise?  Eat a healthy diet    Eat a diet that includes plenty of vegetables, fruits, low-fat dairy products, and lean protein.  Do not eat a lot of foods that are high in solid fats, added sugars, or sodium.  Maintain a healthy weight  Body mass index (BMI) is a measurement that can be used to identify possible weight problems. It estimates body fat based on height and weight. Your health care provider can help determine your BMI and help you achieve or maintain a healthy weight.  Get regular exercise  Get regular exercise. This is one of the most important things you can do for your health. Most adults should:  Exercise for at least 150 minutes each week. The exercise should increase your heart rate and make you sweat (moderate-intensity exercise).  Do strengthening exercises at least twice a week. This is in addition to the moderate-intensity exercise.  Spend less time sitting. Even light physical activity can be beneficial.  Watch cholesterol and blood lipids  Have your blood tested for lipids and cholesterol at 62 years of age, then have this test every 5 years.  You may need to have your cholesterol levels checked more often if:  Your lipid or cholesterol levels are high.  You are older than 62 years of age.  You are at high risk for heart disease.  What should I know about cancer screening?  Many types of cancers can be detected early and may often be prevented. Depending on your health history and family history, you may need to have cancer screening at various ages. This may include screening for:  Colorectal cancer.  Prostate cancer.  Skin cancer.  Lung  cancer.  What should I know about heart disease, diabetes, and high blood pressure?  Blood pressure and heart disease  High blood pressure causes heart disease and increases the risk of stroke. This is more likely to develop in people who have high blood pressure readings or are overweight.  Talk with your health care provider about your target blood pressure readings.  Have your blood pressure checked:  Every 3-5 years if you are 24-52 years of age.  Every year if you are 3 years old or older.  If you are between the ages of 60 and 72 and are a current or former smoker, ask your health care provider if you should have a one-time screening for abdominal aortic aneurysm (AAA).  Diabetes  Have regular diabetes screenings. This checks your fasting blood sugar level. Have the screening done:  Once every three years after age 66 if you are at a normal weight and have a low risk for diabetes.  More often and at a younger age if you are overweight or have a high risk for diabetes.  What should I know about preventing infection?  Hepatitis B  If you have a higher risk for hepatitis B, you should be screened for this virus. Talk with your health care provider to find out if you are at risk for hepatitis B infection.  Hepatitis C  Blood testing is recommended for:  Everyone born from [birth year] through 1964.  Anyone  with known risk factors for hepatitis C.  Sexually transmitted infections (STIs)  You should be screened each year for STIs, including gonorrhea and chlamydia, if:  You are sexually active and are younger than 62 years of age.  You are older than 62 years of age and your health care provider tells you that you are at risk for this type of infection.  Your sexual activity has changed since you were last screened, and you are at increased risk for chlamydia or gonorrhea. Ask your health care provider if you are at risk.  Ask your health care provider about whether you are at high risk for HIV. Your health care provider  may recommend a prescription medicine to help prevent HIV infection. If you choose to take medicine to prevent HIV, you should first get tested for HIV. You should then be tested every 3 months for as long as you are taking the medicine.  Follow these instructions at home:  Alcohol use  Do not drink alcohol if your health care provider tells you not to drink.  If you drink alcohol:  Limit how much you have to 0-2 drinks a day.  Know how much alcohol is in your drink. In the [place] one drink equals one 12 oz bottle of beer (355 mL), one 5 oz glass of wine (148 mL), or one 1 oz glass of hard liquor (44 mL).  Lifestyle  Do not use any products that contain nicotine or tobacco. These products include cigarettes, chewing tobacco, and vaping devices, such as e-cigarettes. If you need help quitting, ask your health care provider.  Do not use street drugs.  Do not share needles.  Ask your health care provider for help if you need support or information about quitting drugs.  General instructions  Schedule regular health, dental, and eye exams.  Stay current with your vaccines.  Tell your health care provider if:  You often feel depressed.  You have ever been abused or do not feel safe at home.  Summary  Adopting a healthy lifestyle and getting preventive care are important in promoting health and wellness.  Follow your health care provider's instructions about healthy diet, exercising, and getting tested or screened for diseases.  Follow your health care provider's instructions on monitoring your cholesterol and blood pressure.  This information is not intended to replace advice given to you by your health care provider. Make sure you discuss any questions you have with your health care provider.  Document Revised: 01/20/2021 Document Reviewed: 01/20/2021  Elsevier Patient Education  2024 [employer].

## 2024-07-11 NOTE — Progress Notes (Unsigned)
 Subjective:  Patient ID: Henry Lang, male    DOB: 10-Jan-1961  Age: 63 y.o. MRN: [id number]  CC: Anemia, Annual Exam, Hypertension, Diabetes, Hyperlipidemia, and Osteoarthritis   HPI Henry Lang presents for a CPX and f/up ----  Discussed the use of AI scribe software for clinical note transcription with the patient, who gave verbal consent to proceed.  History of Present Illness Henry Lang is a 63 year old male who presents for evaluation of pain management.  He experiences persistent hip pain that affects his activity levels, although he feels good when active. He has been evaluated at [facility] for his hip, where he underwent two MRIs and received three CT-guided injections, including an epidural in his back, which helped his back pain but not his hip pain.  He has arthritis-related back pain and takes tramadol  at a dose of 200 mg per day. He is uncertain about its effectiveness, stating, 'I just don't know if it's really doing a lot of good.' He has previously tried diclofenac  (Voltaren ) but did not find it effective and has not used it in the past year.  He reports a history of constipation, for which he takes two pills twice a day, but denies any blood in his stool, diarrhea, or abdominal pain. He mentions occasional blood when wiping.  He has a history of small intestine cancer and a family history of colon cancer. He has been screened for colon cancer.  He has received the shingles vaccine and the initial COVID-19 vaccinations but does not plan to receive further COVID-19 vaccines. He does not receive flu shots. No chest pain, shortness of breath, dizziness, lightheadedness, or urinary issues. No history of hypertension, kidney disease, or gastrointestinal bleeding.     Outpatient Medications Prior to Visit  Medication Sig Dispense Refill   lanreotide acetate (SOMATULINE DEPOT) 120 MG/0.5ML injection Inject 120 mg into the skin every 28 (twenty-eight) days. 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 12 mL 0   traMADol  (ULTRAM -ER) 200 MG 24 hr tablet Take 1 tablet (200 mg total) by mouth daily as needed for pain. 90 tablet 0   diclofenac  (VOLTAREN ) 75 MG EC tablet TAKE 1 TABLET(75 MG) BY MOUTH TWICE DAILY 180 tablet 0   No facility-administered medications prior to visit.    ROS Review of Systems  Genitourinary: Negative.   Musculoskeletal:  Positive for arthralgias and back pain.  Neurological: Negative.  Negative for dizziness and weakness.  Hematological:  Negative for adenopathy. Does not bruise/bleed easily.  Psychiatric/Behavioral: Negative.      Objective:  BP 124/80 (BP Location: Right Arm, Patient Position: Sitting, Cuff Size: Normal)   Pulse 79   Temp 98.6 F (37 C) (Oral)   Resp 16   Ht 5' 11 (1.803 m)   Wt 188 lb (85.3 kg)   SpO2 98%   BMI 26.22 kg/m   BP Readings from Last 3 Encounters:  07/11/24 124/80  01/10/24 120/86  07/12/23 122/78    Wt Readings from Last 3 Encounters:  07/11/24 188 lb (85.3 kg)  01/10/24 184 lb 9.6 oz (83.7 kg)  07/12/23 189 lb 9.6 oz (86 kg)    Physical Exam Cardiovascular:     Comments: He deferred on an EKG Abdominal:  Hernia: There is no hernia in the left inguinal area or right inguinal area.  Genitourinary:    Pubic Area: No rash.      Penis: Normal.      Testes: Normal.     Epididymis:     Right: Normal.     Left: Normal.     Prostate: Normal. Not enlarged, not tender and no nodules present.     Rectum: Normal. Guaiac result negative. No mass, tenderness, anal fissure, external hemorrhoid or internal hemorrhoid. Normal anal tone.  Lymphadenopathy:     Lower Body: No right inguinal adenopathy. No left inguinal adenopathy.     Lab Results  Component Value Date   WBC 5.3  07/11/2024   HGB 13.2 07/11/2024   HCT 37.7 (L) 07/11/2024   PLT 168.0 07/11/2024   GLUCOSE 83 07/11/2024   CHOL 87 07/12/2023   TRIG 42.0 07/12/2023   HDL 39.60 07/12/2023   LDLCALC 39 07/12/2023   ALT 17 07/11/2024   AST 17 07/11/2024   NA 139 07/11/2024   K 3.7 07/11/2024   CL 103 07/11/2024   CREATININE 0.89 07/11/2024   BUN 12 07/11/2024   CO2 28 07/11/2024   TSH 3.93 07/11/2024   PSA 0.52 07/11/2024   INR 0.99 08/04/2018   HGBA1C 5.0 07/11/2024   MICROALBUR 1.1 01/10/2024    CT BIOPSY Result Date: 02/16/2024 CLINICAL DATA:  Right hip and sacroiliac pain EXAM: RIGHT CT GUIDED SI JOINT INJECTION PROCEDURE: After a thorough discussion of risks and benefits of the procedure, including bleeding, infection, injury to nerves, blood vessels, and adjacent structures, verbal and written consent was obtained. Specific risks of the procedure included nondiagnostic/nontherapeutic injection and non target injection. The patient was placed prone on the CT table and localization was performed over the sacrum. Target site marked using CT guidance. The skin was prepped and draped in the usual sterile fashion using Betadine soap. After local anesthesia with 1% lidocaine  without epinephrine  and subsequent deep anesthesia, a 3.5 inch 22 gauge spinal needle was advanced into the posteroinferior margin right SI joint under intermittent CT guidance. Once the needle was in satisfactory position, representative image was captured with the needle demonstrated in the sacroiliac joint. Subsequently, 3 mL bupivacaine  0.5% was injected into the right SI joint. Needles removed and a sterile dressing applied. No complications were observed. RADIATION DOSE REDUCTION: This exam was performed according to the departmental dose-optimization program which includes automated exposure control, adjustment of the mA and/or kV according to patient size and/or use of iterative reconstruction technique IMPRESSION: Successful  CT-guided right SI joint injection. . Electronically Signed   By: JONETTA Faes M.D.   On: 02/16/2024 09:37    Assessment & Plan:  Type II diabetes mellitus with manifestations (HCC) -     Basic metabolic panel with GFR; Future -     Hemoglobin A1c; Future -     Urinalysis, Routine w reflex microscopic; Future -     HM Diabetes Foot Exam  Essential hypertension -     Basic metabolic panel with GFR; Future -     CBC with Differential/Platelet; Future -     TSH; Future -     Urinalysis, Routine w reflex microscopic; Future  Hyperlipidemia LDL goal <70 -     Hepatic function panel; Future  Routine general medical examination at a health care facility -     PSA; Future  Spinal stenosis, lumbar region, without neurogenic claudication -     Celecoxib; Take 1  capsule (200 mg total) by mouth daily.  Dispense: 90 capsule; Refill: 1  Primary osteoarthritis of both hips -     Celecoxib; Take 1 capsule (200 mg total) by mouth daily.  Dispense: 90 capsule; Refill: 1  Need for zoster vaccination -     Varicella-zoster vaccine IM     Follow-up: Return in about 6 months (around 01/09/2025).  Debby Molt, MD

## 2024-07-12 ENCOUNTER — Ambulatory Visit

## 2024-07-12 DIAGNOSIS — E785 Hyperlipidemia, unspecified: Secondary | ICD-10-CM | POA: Diagnosis not present

## 2024-07-12 LAB — LIPID PANEL
Cholesterol: 87 mg/dL (ref 0–200)
HDL: 47.4 mg/dL (ref >39.00)
LDL Cholesterol: 25 mg/dL (ref 0–99)
NonHDL: 39.15
Total CHOL/HDL Ratio: 2
Triglycerides: 70 mg/dL (ref 0.0–149.0)
VLDL: 14 mg/dL (ref 0.0–40.0)

## 2024-07-12 MED ORDER — ROSUVASTATIN CALCIUM 5 MG PO TABS: 5.0000 mg | ORAL_TABLET | Freq: Every day | ORAL | 1 refills | Status: AC

## 2024-07-16 ENCOUNTER — Other Ambulatory Visit: Payer: Self-pay | Admitting: Internal Medicine

## 2024-07-16 DIAGNOSIS — E118 Type 2 diabetes mellitus with unspecified complications: Secondary | ICD-10-CM

## 2024-07-20 ENCOUNTER — Other Ambulatory Visit: Payer: Self-pay | Admitting: Internal Medicine

## 2024-07-20 DIAGNOSIS — M48061 Spinal stenosis, lumbar region without neurogenic claudication: Secondary | ICD-10-CM

## 2024-07-26 DIAGNOSIS — C7A011 Malignant carcinoid tumor of the jejunum: Secondary | ICD-10-CM | POA: Diagnosis not present

## 2024-08-02 DIAGNOSIS — C7A011 Malignant carcinoid tumor of the jejunum: Secondary | ICD-10-CM | POA: Diagnosis not present

## 2024-09-10 ENCOUNTER — Other Ambulatory Visit: Payer: Self-pay | Admitting: Internal Medicine

## 2024-09-10 DIAGNOSIS — M48061 Spinal stenosis, lumbar region without neurogenic claudication: Secondary | ICD-10-CM

## 2024-09-10 DIAGNOSIS — M160 Bilateral primary osteoarthritis of hip: Secondary | ICD-10-CM
